# Patient Record
Sex: Male | Born: 1975 | Race: White | Hispanic: No | Marital: Married | Smoking: Former smoker
Health system: Southern US, Community
[De-identification: ages and names within clinical notes are randomized; demographics above are authoritative.]

## PROBLEM LIST (undated history)

## (undated) ENCOUNTER — Ambulatory Visit: Discharge status: Absent without leave | Payer: BC Managed Care – PPO

## (undated) DIAGNOSIS — I10 Essential (primary) hypertension: Secondary | ICD-10-CM

## (undated) DIAGNOSIS — K222 Esophageal obstruction: Secondary | ICD-10-CM

## (undated) DIAGNOSIS — F32A Depression, unspecified: Secondary | ICD-10-CM

## (undated) DIAGNOSIS — R634 Abnormal weight loss: Secondary | ICD-10-CM

## (undated) DIAGNOSIS — F329 Major depressive disorder, single episode, unspecified: Secondary | ICD-10-CM

## (undated) DIAGNOSIS — F419 Anxiety disorder, unspecified: Secondary | ICD-10-CM

## (undated) DIAGNOSIS — E785 Hyperlipidemia, unspecified: Secondary | ICD-10-CM

## (undated) HISTORY — PX: PILONIDAL CYST EXCISION: SHX744

## (undated) HISTORY — DX: Hyperlipidemia, unspecified: E78.5

## (undated) HISTORY — DX: Depression, unspecified: F32.A

## (undated) HISTORY — DX: Essential (primary) hypertension: I10

## (undated) HISTORY — DX: Esophageal obstruction: K22.2

## (undated) HISTORY — DX: Major depressive disorder, single episode, unspecified: F32.9

## (undated) HISTORY — DX: Anxiety disorder, unspecified: F41.9

## (undated) HISTORY — PX: WISDOM TOOTH EXTRACTION: SHX21

## (undated) HISTORY — DX: Abnormal weight loss: R63.4

---

## 2007-01-15 ENCOUNTER — Emergency Department: Payer: Self-pay | Admitting: Unknown Physician Specialty

## 2008-08-26 ENCOUNTER — Ambulatory Visit: Payer: Self-pay | Admitting: Gastroenterology

## 2011-02-02 ENCOUNTER — Encounter: Payer: Self-pay | Admitting: Otolaryngology

## 2011-02-06 ENCOUNTER — Encounter: Payer: Self-pay | Admitting: Otolaryngology

## 2012-03-06 ENCOUNTER — Ambulatory Visit: Payer: Self-pay | Admitting: Gastroenterology

## 2012-04-04 ENCOUNTER — Ambulatory Visit: Payer: Self-pay | Admitting: Gastroenterology

## 2012-12-01 ENCOUNTER — Encounter: Payer: Self-pay | Admitting: Podiatry

## 2012-12-01 ENCOUNTER — Ambulatory Visit (INDEPENDENT_AMBULATORY_CARE_PROVIDER_SITE_OTHER): Payer: Managed Care, Other (non HMO) | Admitting: Podiatry

## 2012-12-01 VITALS — BP 129/81 | HR 110 | Resp 16 | Ht 70.0 in | Wt 210.0 lb

## 2012-12-01 DIAGNOSIS — M79609 Pain in unspecified limb: Secondary | ICD-10-CM

## 2012-12-01 DIAGNOSIS — M79674 Pain in right toe(s): Secondary | ICD-10-CM

## 2012-12-02 NOTE — Progress Notes (Signed)
Bernard [name] presents today as a diabetic with an A1c of a 7.2 concerned about hallux nail right that was growing he regularly and a portion of it sloughed off yesterday. He denies fever chills nausea vomiting muscle aches or pains denies any trauma to the right foot.  Objective: Vital signs are stable he is alert and oriented x3. Strong palpable pulses to the bilateral lower extremity. Neurologic sensorium is intact per Semmes-Weinstein monofilament bilateral. Muscle strength is good bilaterally. No open lesions to the plantar or dorsal cutis. Nail plate does have an irregularity medial to lateral more than likely secondary to trauma many months ago. It does appear that a portion of the nail has sloughed off but is the most distal portion and does not involve any of the soft tissue. There is no signs of infection, i.e. no erythema edema cellulitis drainage or odor.  Assessment: Diabetes with the nail disorder. Nail disorder is primarily limited to the hallux right.  Plan: Followup with me on an as-needed basis.

## 2013-11-12 LAB — HM DIABETES EYE EXAM

## 2014-02-16 LAB — HM DIABETES FOOT EXAM

## 2014-05-24 ENCOUNTER — Ambulatory Visit
Admit: 2014-05-24 | Disposition: A | Discharge status: Home / Self Care | Payer: Self-pay | Attending: Family Medicine | Admitting: Family Medicine

## 2014-05-24 LAB — COMPREHENSIVE METABOLIC PANEL
ALBUMIN: 4.7 g/dL
AST: 23 U/L
Alkaline Phosphatase: 70 U/L
Anion Gap: 9 (ref 7–16)
BILIRUBIN TOTAL: 0.6 mg/dL
BUN: 11 mg/dL
Calcium, Total: 9.6 mg/dL
Chloride: 103 mmol/L
Co2: 29 mmol/L
Creatinine: 0.79 mg/dL
EGFR (African American): >60
Glucose: 267 mg/dL — ABNORMAL HIGH
Potassium: 4.3 mmol/L
SGPT (ALT): 28 U/L
Sodium: 141 mmol/L
Total Protein: 7.2 g/dL

## 2014-05-24 LAB — CBC WITH DIFFERENTIAL/PLATELET
Basophil #: 0.1 10*3/uL (ref 0.0–0.1)
Basophil %: 0.7 %
EOS PCT: 7.8 %
Eosinophil #: 0.6 10*3/uL (ref 0.0–0.7)
HCT: 46.7 % (ref 40.0–52.0)
HGB: 16.1 g/dL (ref 13.0–18.0)
LYMPHS PCT: 23.7 %
Lymphocyte #: 1.8 10*3/uL (ref 1.0–3.6)
MCH: 30.2 pg (ref 26.0–34.0)
MCHC: 34.5 g/dL (ref 32.0–36.0)
MCV: 88 fL (ref 80–100)
Monocyte #: 0.5 x10 3/mm (ref 0.2–1.0)
Monocyte %: 6.5 %
Neutrophil #: 4.6 10*3/uL (ref 1.4–6.5)
Neutrophil %: 61.3 %
PLATELETS: 199 10*3/uL (ref 150–440)
RBC: 5.34 10*6/uL (ref 4.40–5.90)
RDW: 13.5 % (ref 11.5–14.5)
WBC: 7.5 10*3/uL (ref 3.8–10.6)

## 2014-05-24 LAB — CK-MB: CK-MB: 0.7 ng/mL

## 2014-05-24 LAB — CK: CK, TOTAL: 72 U/L

## 2014-05-24 LAB — TROPONIN I

## 2014-07-19 ENCOUNTER — Telehealth: Payer: Self-pay | Admitting: Family Medicine

## 2014-07-19 NOTE — Telephone Encounter (Signed)
Pt called wants to know if all his RX's can be written for 3 month supplies and be sent to [facility]. Please call pt with any questions or concerns at @ [phone]. Thanks.-

## 2014-07-21 ENCOUNTER — Telehealth: Payer: Self-pay | Admitting: Family Medicine

## 2014-07-21 MED ORDER — ATORVASTATIN CALCIUM 40 MG PO TABS: 40.0000 mg | ORAL_TABLET | Freq: Every day | ORAL | Status: DC

## 2014-07-21 MED ORDER — INSULIN GLARGINE 100 UNIT/ML ~~LOC~~ SOLN: 26.0000 [IU] | Freq: Every day | SUBCUTANEOUS | Status: DC

## 2014-07-21 MED ORDER — ARIPIPRAZOLE 5 MG PO TABS: 5.0000 mg | ORAL_TABLET | Freq: Every day | ORAL | Status: DC

## 2014-07-21 MED ORDER — INSULIN ASPART 100 UNIT/ML CARTRIDGE (PENFILL): SUBCUTANEOUS | Status: DC

## 2014-07-21 MED ORDER — AMLODIPINE BESYLATE 10 MG PO TABS: 10.0000 mg | ORAL_TABLET | Freq: Every day | ORAL | Status: DC

## 2014-07-21 MED ORDER — COLESEVELAM HCL 3.75 G PO PACK: 1.0000 | PACK | ORAL | Status: DC

## 2014-07-21 MED ORDER — RABEPRAZOLE SODIUM 20 MG PO TBEC: 20.0000 mg | DELAYED_RELEASE_TABLET | Freq: Two times a day (BID) | ORAL | Status: DC

## 2014-07-21 NOTE — Telephone Encounter (Signed)
Pt called stated some of the medication refills he received are not what he was expecting them to be. Please call pt ASAP @ [phone]. Thanks.

## 2014-07-22 ENCOUNTER — Telehealth: Payer: Self-pay | Admitting: Family Medicine

## 2014-07-22 MED ORDER — RANITIDINE HCL 150 MG PO TABS: 150.0000 mg | ORAL_TABLET | Freq: Every day | ORAL | Status: DC

## 2014-07-22 MED ORDER — INSULIN GLARGINE 100 UNIT/ML ~~LOC~~ SOLN: 30.0000 [IU] | Freq: Two times a day (BID) | SUBCUTANEOUS | Status: DC

## 2014-07-22 MED ORDER — INSULIN LISPRO 100 UNIT/ML (KWIKPEN): 60.0000 [IU] | PEN_INJECTOR | Freq: Three times a day (TID) | SUBCUTANEOUS | Status: DC

## 2014-07-22 MED ORDER — PANTOPRAZOLE SODIUM 40 MG PO TBEC: 40.0000 mg | DELAYED_RELEASE_TABLET | Freq: Every day | ORAL | Status: DC

## 2014-07-22 NOTE — Telephone Encounter (Signed)
E-Fax came through for refill: Rx: Welchol Rx in basket next to my desk

## 2014-07-22 NOTE — Telephone Encounter (Signed)
E-fax came through for refill: Rx: Abilify

## 2014-07-22 NOTE — Telephone Encounter (Signed)
Filled on 07/21/14

## 2014-07-22 NOTE — Telephone Encounter (Signed)
Patient NOT taking Novolog - replaced with Humalog quickpen 60units daily Lantus needs to state 30units BID NOT taking Aciphex (needs to be d/c) Replaced with Protonix 31m Qam and Zantac 1563mQhs ( written by specialist but would like you to fill)  [facility]

## 2014-07-23 ENCOUNTER — Telehealth: Payer: Self-pay

## 2014-07-23 NOTE — Telephone Encounter (Signed)
Fax requesting clarification of directions for Ability 47m Patient takes 1Tab QHS [facility]

## 2014-07-26 ENCOUNTER — Telehealth: Payer: Self-pay | Admitting: Family Medicine

## 2014-08-11 ENCOUNTER — Telehealth: Payer: Self-pay

## 2014-08-11 NOTE — Telephone Encounter (Signed)
Rx Refill Request Received from: [facility] Medication: BD ULT-FN 29G 1/2" Last Seen:03/02/14 Next Due: Now Last Prescription: 2015

## 2014-08-12 MED ORDER — INSULIN PEN NEEDLE 29G X 12.7MM MISC: 1.0000 | Freq: Three times a day (TID) | Status: AC

## 2014-08-12 NOTE — Telephone Encounter (Signed)
Rx sent to his pharmacy.

## 2014-09-09 DIAGNOSIS — E785 Hyperlipidemia, unspecified: Secondary | ICD-10-CM | POA: Insufficient documentation

## 2014-09-09 DIAGNOSIS — I10 Essential (primary) hypertension: Secondary | ICD-10-CM | POA: Insufficient documentation

## 2014-09-09 DIAGNOSIS — F339 Major depressive disorder, recurrent, unspecified: Secondary | ICD-10-CM | POA: Insufficient documentation

## 2014-09-20 ENCOUNTER — Encounter: Payer: Self-pay | Admitting: Family Medicine

## 2014-11-18 ENCOUNTER — Encounter: Payer: Self-pay | Admitting: Family Medicine

## 2014-12-01 ENCOUNTER — Encounter: Payer: Self-pay | Admitting: Family Medicine

## 2014-12-01 ENCOUNTER — Ambulatory Visit (INDEPENDENT_AMBULATORY_CARE_PROVIDER_SITE_OTHER): Payer: Managed Care, Other (non HMO) | Admitting: Family Medicine

## 2014-12-01 VITALS — BP 113/75 | HR 101 | Temp 98.4°F | Ht 69.0 in | Wt 197.0 lb

## 2014-12-01 DIAGNOSIS — I10 Essential (primary) hypertension: Secondary | ICD-10-CM | POA: Diagnosis not present

## 2014-12-01 DIAGNOSIS — Z23 Encounter for immunization: Secondary | ICD-10-CM

## 2014-12-01 DIAGNOSIS — Z0000 Encounter for general adult medical examination without abnormal findings: Secondary | ICD-10-CM | POA: Diagnosis not present

## 2014-12-01 DIAGNOSIS — E785 Hyperlipidemia, unspecified: Secondary | ICD-10-CM

## 2014-12-01 DIAGNOSIS — E109 Type 1 diabetes mellitus without complications: Secondary | ICD-10-CM | POA: Insufficient documentation

## 2014-12-01 DIAGNOSIS — E119 Type 2 diabetes mellitus without complications: Secondary | ICD-10-CM | POA: Diagnosis not present

## 2014-12-01 DIAGNOSIS — F329 Major depressive disorder, single episode, unspecified: Secondary | ICD-10-CM

## 2014-12-01 DIAGNOSIS — F32A Depression, unspecified: Secondary | ICD-10-CM

## 2014-12-01 DIAGNOSIS — E1139 Type 2 diabetes mellitus with other diabetic ophthalmic complication: Secondary | ICD-10-CM | POA: Insufficient documentation

## 2014-12-01 LAB — URINALYSIS, ROUTINE W REFLEX MICROSCOPIC
Bilirubin, UA: NEGATIVE
Ketones, UA: NEGATIVE
Leukocytes, UA: NEGATIVE
NITRITE UA: NEGATIVE
Protein, UA: NEGATIVE
SPEC GRAV UA: 1.01 (ref 1.005–1.030)
Urobilinogen, Ur: 0.2 mg/dL (ref 0.2–1.0)
pH, UA: 6.5 (ref 5.0–7.5)

## 2014-12-01 LAB — MICROSCOPIC EXAMINATION
Bacteria, UA: NONE SEEN
EPITHELIAL CELLS (NON RENAL): NONE SEEN /HPF (ref 0–10)
RBC, UA: NONE SEEN /hpf (ref 0–?)
WBC UA: NONE SEEN /HPF (ref 0–?)

## 2014-12-01 LAB — BAYER DCA HB A1C WAIVED: HB A1C: 8.6 % — AB (ref <7.0)

## 2014-12-01 MED ORDER — PANTOPRAZOLE SODIUM 40 MG PO TBEC: 40.0000 mg | DELAYED_RELEASE_TABLET | Freq: Every day | ORAL | Status: DC

## 2014-12-01 NOTE — Assessment & Plan Note (Signed)
The current medical regimen is effective;  continue present plan and medications.  

## 2014-12-01 NOTE — Progress Notes (Signed)
BP 113/75 mmHg  Pulse 101  Temp(Src) 98.4 F (36.9 C)  Ht 5' 9"  (1.753 m)  Wt 197 lb (89.359 kg)  BMI 29.08 kg/m2  SpO2 97%   Subjective:    Patient ID: Bernard [name], male    DOB: July 14, 1975, 39 y.o.   MRN: [id number]  HPI: Bernard [name] is a 39 y.o. male  Chief Complaint  Patient presents with  . Annual Exam   patient's #1 concern today is restarted smoking again and discuss smoking cessation various agents and medications will start with nicotine chewing gum. Also has not had hemoglobin A1c are diabetic evaluation because of job change in insurance change. Ready to get reestablished. Nerves anxiety been stable Blood pressures been stable Cholesterol is been stable aching medications without side effects and faithfully. Reflux doing okay Relevant past medical, surgical, family and social history reviewed and updated as indicated. Interim medical history since our last visit reviewed. Allergies and medications reviewed and updated.  Review of Systems  Constitutional: Negative.   HENT: Negative.   Eyes: Negative.   Respiratory: Negative.   Cardiovascular: Negative.   Gastrointestinal: Negative.   Endocrine: Negative.   Genitourinary: Negative.   Musculoskeletal: Negative.   Skin: Negative.   Allergic/Immunologic: Negative.   Neurological: Negative.   Hematological: Negative.   Psychiatric/Behavioral: Negative.     Per HPI unless specifically indicated above     Objective:    BP 113/75 mmHg  Pulse 101  Temp(Src) 98.4 F (36.9 C)  Ht 5' 9"  (1.753 m)  Wt 197 lb (89.359 kg)  BMI 29.08 kg/m2  SpO2 97%  Wt Readings from Last 3 Encounters:  12/01/14 197 lb (89.359 kg)  03/02/14 204 lb (92.534 kg)  12/01/12 210 lb (95.255 kg)    Physical Exam  Constitutional: He is oriented to person, place, and time. He appears well-developed and well-nourished.  HENT:  Head: Normocephalic and atraumatic.  Right Ear: External ear normal.  Left Ear:  External ear normal.  Eyes: Conjunctivae and EOM are normal. Pupils are equal, round, and reactive to light.  Neck: Normal range of motion. Neck supple.  Cardiovascular: Normal rate, regular rhythm, normal heart sounds and intact distal pulses.   Pulmonary/Chest: Effort normal and breath sounds normal.  Abdominal: Soft. Bowel sounds are normal. There is no splenomegaly or hepatomegaly.  Genitourinary: Rectum normal and penis normal.  Musculoskeletal: Normal range of motion.  Neurological: He is alert and oriented to person, place, and time. He has normal reflexes.  Skin: No rash noted. No erythema.  Psychiatric: He has a normal mood and affect. His behavior is normal. Judgment and thought content normal.    Results for orders placed or performed during the hospital encounter of 05/24/14  CK  Result Value Ref Range   CK, Total 72 U/L  CK-MB  Result Value Ref Range   CK-MB 0.7 ng/mL  Troponin I  Result Value Ref Range   Troponin-I <0.03 ng/mL      Assessment & Plan:   Problem List Items Addressed This Visit      Cardiovascular and Mediastinum   Hypertension    The current medical regimen is effective;  continue present plan and medications.         Endocrine   Diabetes mellitus without complication ([place])    Discussed diabetes insulin dosing and hyperglycemia We will decrease Levemir by a couple of units and observe nighttime low blood sugar spells We'll also consider midday insulin increase to help with elevated  glucose readings during the afternoon. Also discussed endocrinology referral consideration of insulin pumps etc.      Relevant Orders   Bayer DCA Hb A1c Waived     Other   Hyperlipidemia    The current medical regimen is effective;  continue present plan and medications.       Depression    The current medical regimen is effective;  continue present plan and medications.        Other Visit Diagnoses    Immunization due    -  Primary    Relevant  Orders    Flu Vaccine QUAD 36+ mos PF IM (Fluarix & Fluzone Quad PF) (Completed)    PE (physical exam), annual        Relevant Orders    Comprehensive metabolic panel    Lipid panel    CBC with Differential/Platelet    Urinalysis, Routine w reflex microscopic (not at [facility])    TSH        Follow up plan: Return in about 3 months (around 03/03/2015) for Repeat hemoglobin A1c.

## 2014-12-01 NOTE — Assessment & Plan Note (Signed)
Discussed diabetes insulin dosing and hyperglycemia We will decrease Levemir by a couple of units and observe nighttime low blood sugar spells We'll also consider midday insulin increase to help with elevated glucose readings during the afternoon. Also discussed endocrinology referral consideration of insulin pumps etc.

## 2014-12-02 ENCOUNTER — Encounter: Payer: Self-pay | Admitting: Family Medicine

## 2014-12-02 LAB — COMPREHENSIVE METABOLIC PANEL
A/G RATIO: 2.3 (ref 1.1–2.5)
ALT: 41 IU/L (ref 0–44)
AST: 20 IU/L (ref 0–40)
Albumin: 4.6 g/dL (ref 3.5–5.5)
Alkaline Phosphatase: 74 IU/L (ref 39–117)
BUN/Creatinine Ratio: 9 (ref 8–19)
BUN: 8 mg/dL (ref 6–20)
Bilirubin Total: 0.6 mg/dL (ref 0.0–1.2)
CALCIUM: 9.8 mg/dL (ref 8.7–10.2)
CO2: 24 mmol/L (ref 18–29)
Chloride: 101 mmol/L (ref 97–106)
Creatinine, Ser: 0.93 mg/dL (ref 0.76–1.27)
GFR, EST AFRICAN AMERICAN: 120 mL/min/{1.73_m2} (ref >59)
GFR, EST NON AFRICAN AMERICAN: 104 mL/min/{1.73_m2} (ref >59)
GLOBULIN, TOTAL: 2 g/dL (ref 1.5–4.5)
Glucose: 311 mg/dL — ABNORMAL HIGH (ref 65–99)
POTASSIUM: 4.3 mmol/L (ref 3.5–5.2)
Sodium: 142 mmol/L (ref 136–144)
Total Protein: 6.6 g/dL (ref 6.0–8.5)

## 2014-12-02 LAB — CBC WITH DIFFERENTIAL/PLATELET
BASOS: 1 %
Basophils Absolute: 0 10*3/uL (ref 0.0–0.2)
EOS (ABSOLUTE): 0.4 10*3/uL (ref 0.0–0.4)
Eos: 5 %
HEMOGLOBIN: 15.8 g/dL (ref 12.6–17.7)
Hematocrit: 45.3 % (ref 37.5–51.0)
IMMATURE GRANULOCYTES: 0 %
Immature Grans (Abs): 0 10*3/uL (ref 0.0–0.1)
Lymphocytes Absolute: 1.5 10*3/uL (ref 0.7–3.1)
Lymphs: 21 %
MCH: 30.4 pg (ref 26.6–33.0)
MCHC: 34.9 g/dL (ref 31.5–35.7)
MCV: 87 fL (ref 79–97)
MONOCYTES: 6 %
MONOS ABS: 0.4 10*3/uL (ref 0.1–0.9)
Neutrophils Absolute: 4.7 10*3/uL (ref 1.4–7.0)
Neutrophils: 67 %
Platelets: 221 10*3/uL (ref 150–379)
RBC: 5.19 x10E6/uL (ref 4.14–5.80)
RDW: 13.8 % (ref 12.3–15.4)
WBC: 7 10*3/uL (ref 3.4–10.8)

## 2014-12-02 LAB — TSH: TSH: 1.46 u[IU]/mL (ref 0.450–4.500)

## 2014-12-02 LAB — LIPID PANEL
CHOL/HDL RATIO: 3 ratio (ref 0.0–5.0)
Cholesterol, Total: 154 mg/dL (ref 100–199)
HDL: 52 mg/dL (ref >39)
LDL Calculated: 77 mg/dL (ref 0–99)
TRIGLYCERIDES: 126 mg/dL (ref 0–149)
VLDL Cholesterol Cal: 25 mg/dL (ref 5–40)

## 2015-01-27 ENCOUNTER — Other Ambulatory Visit: Payer: Self-pay | Admitting: Family Medicine

## 2015-02-13 ENCOUNTER — Encounter: Payer: Self-pay | Admitting: *Deleted

## 2015-02-13 ENCOUNTER — Ambulatory Visit
Admission: EM | Admit: 2015-02-13 | Discharge: 2015-02-13 | Disposition: A | Discharge status: Home / Self Care | Payer: Managed Care, Other (non HMO) | Attending: Family Medicine | Admitting: Family Medicine

## 2015-02-13 DIAGNOSIS — H66011 Acute suppurative otitis media with spontaneous rupture of ear drum, right ear: Secondary | ICD-10-CM | POA: Diagnosis not present

## 2015-02-13 MED ORDER — FLUTICASONE PROPIONATE 50 MCG/ACT NA SUSP: 2.0000 | Freq: Every day | NASAL | Status: DC

## 2015-02-13 MED ORDER — FEXOFENADINE-PSEUDOEPHED ER 180-240 MG PO TB24: 1.0000 | ORAL_TABLET | Freq: Every day | ORAL | Status: DC

## 2015-02-13 MED ORDER — AMOXICILLIN-POT CLAVULANATE 875-125 MG PO TABS: 1.0000 | ORAL_TABLET | Freq: Two times a day (BID) | ORAL | Status: DC

## 2015-02-13 NOTE — ED Notes (Addendum)
Pt states that he had a cold about a week ago, now having right ear pain with drainage that started, about 3or4 days ago.

## 2015-02-13 NOTE — ED Provider Notes (Signed)
CSN: [id number]     Arrival date & time 02/13/15  1026 History   First MD Initiated Contact with Patient 02/13/15 1059    Nurses notes were reviewed. Chief Complaint  Patient presents with  . Otalgia   patient reports right ear pain. He states that he's been having URI symptoms for about a week and a half and then on Wednesday he started having right eardrum pain. He states that late Friday night and Saturday cyanosis drainage coming from the right ear as well. States the pain in the ear has fluctuated between 4 and 8/10.  He does not smoke. He is a former smoker He does have diabetes hypertension hyperlipidemia.   Mother and father both had cancer  (Consider location/radiation/quality/duration/timing/severity/associated sxs/prior Treatment) Patient is a 40 y.o. male presenting with ear pain. The history is provided by the patient. No language interpreter was used.  Otalgia Location:  Right Quality:  Pressure and sharp Severity:  Moderate Onset quality:  Sudden Timing:  Constant Progression:  Waxing and waning Chronicity:  New Context: not direct blow and not elevation change   Relieved by:  Nothing Worsened by:  Nothing tried Ineffective treatments:  OTC medications Associated symptoms: congestion and ear discharge     Past Medical History  Diagnosis Date  . Diabetes mellitus without complication ([place])   . Depression   . Hyperlipidemia   . Hypertension   . Esophageal stricture    Past Surgical History  Procedure Laterality Date  . Pilonidal cyst excision     Family History  Problem Relation Age of Onset  . Cancer Mother   . Cancer Father    Social History  Substance Use Topics  . Smoking status: Former Research scientist (life sciences)  . Smokeless tobacco: Never Used     Comment: quit 2014  . Alcohol Use: Yes    Review of Systems  HENT: Positive for congestion, ear discharge and ear pain.   All other systems reviewed and are negative.   Allergies  Review of patient's allergies  indicates no known allergies.  Home Medications   Prior to Admission medications   Medication Sig Start Date End Date Taking? Authorizing Provider  amLODipine (NORVASC) 10 MG tablet Take 1 tablet (10 mg total) by mouth daily. 07/21/14  Yes Guadalupe Maple, MD  ARIPiprazole (ABILIFY) 5 MG tablet Take 1 tablet (5 mg total) by mouth daily. Half a tab at bedtime 07/21/14  Yes Guadalupe Maple, MD  atorvastatin (LIPITOR) 40 MG tablet Take 1 tablet (40 mg total) by mouth daily. 07/21/14  Yes Guadalupe Maple, MD  Colesevelam HCl [facility]) 3.75 G PACK Take 1 packet by mouth 1 day or 1 dose. 07/21/14  Yes Guadalupe Maple, MD  HUMALOG KWIKPEN 100 UNIT/ML KiwkPen INJECT [place] DAILY 01/27/15  Yes Guadalupe Maple, MD  Insulin Pen Needle 29G X 12.7MM MISC 1 each by Does not apply route 3 (three) times daily. 08/12/14  Yes Megan P Johnson, DO  LANTUS SOLOSTAR 100 UNIT/ML Solostar Pen INJECT 29 UNITS SUBCUTANEOUSLY AT BREAKFAST AND INJECT 29 UNITS SUBCUTANEOUSLY AT BEDTIME 01/27/15  Yes Guadalupe Maple, MD  pantoprazole (PROTONIX) 40 MG tablet Take 1 tablet (40 mg total) by mouth daily. 12/01/14  Yes Guadalupe Maple, MD  ranitidine (ZANTAC) 150 MG tablet Take 1 tablet (150 mg total) by mouth at bedtime. 07/22/14  Yes Guadalupe Maple, MD  amoxicillin-clavulanate (AUGMENTIN) 875-125 MG tablet Take 1 tablet by mouth 2 (two) times daily. 02/13/15  Frederich Cha, MD  fexofenadine-pseudoephedrine (ALLEGRA-D ALLERGY & CONGESTION) 180-240 MG 24 hr tablet Take 1 tablet by mouth daily. 02/13/15   Frederich Cha, MD  fluticasone (FLONASE) 50 MCG/ACT nasal spray Place 2 sprays into both nostrils daily. 02/13/15   Frederich Cha, MD   Meds Ordered and Administered this Visit  Medications - No data to display  BP 122/70 mmHg  Pulse 99  Temp(Src) 98.8 F (37.1 C) (Tympanic)  Ht 5' 10"  (1.778 m)  Wt 196 lb (88.905 kg)  BMI 28.12 kg/m2  SpO2 99% No data found.   Physical Exam  ED Course  Procedures  (including critical care time)  Labs Review Labs Reviewed - No data to display  Imaging Review No results found.   Visual Acuity Review  Right Eye Distance:   Left Eye Distance:   Bilateral Distance:    Right Eye Near:   Left Eye Near:    Bilateral Near:         MDM   1. Acute suppurative otitis media of right ear with spontaneous rupture of tympanic membrane, recurrence not specified    Patient will be placed on Augmentin 875 one tablet twice a day Flonase nasal spray 2 puffs each nostril and Allegra-D 24 hours 1 tablet daily. Stressed importance of having the eardrum on the right checked in the about 3 weeks by his PCP to make sure that the spontaneous rupture that I suspected has healed. Explained to him if it hasn't he probably will need ENT referral at that time. He declines work note for tomorrow.    Frederich Cha, MD 02/13/15 1118

## 2015-02-13 NOTE — Discharge Instructions (Signed)
Otitis Media, Adult Otitis media is redness, soreness, and puffiness (swelling) in the space just behind your eardrum (middle ear). It may be caused by allergies or infection. It often happens along with a cold. HOME CARE  Take your medicine as told. Finish it even if you start to feel better.  Only take over-the-counter or prescription medicines for pain, discomfort, or fever as told by your doctor.  Follow up with your doctor as told. GET HELP IF:  You have otitis media only in one ear, or bleeding from your nose, or both.  You notice a lump on your neck.  You are not getting better in 3-5 days.  You feel worse instead of better. GET HELP RIGHT AWAY IF:   You have pain that is not helped with medicine.  You have puffiness, redness, or pain around your ear.  You get a stiff neck.  You cannot move part of your face (paralysis).  You notice that the bone behind your ear hurts when you touch it. MAKE SURE YOU:   Understand these instructions.  Will watch your condition.  Will get help right away if you are not doing well or get worse.   This information is not intended to replace advice given to you by your health care provider. Make sure you discuss any questions you have with your health care provider.   Document Released: 07/11/2007 Document Revised: 02/12/2014 Document Reviewed: 08/19/2012 Elsevier Interactive Patient Education [employer].

## 2015-02-23 ENCOUNTER — Ambulatory Visit (INDEPENDENT_AMBULATORY_CARE_PROVIDER_SITE_OTHER): Payer: Managed Care, Other (non HMO) | Admitting: Unknown Physician Specialty

## 2015-02-23 ENCOUNTER — Encounter: Payer: Self-pay | Admitting: Unknown Physician Specialty

## 2015-02-23 VITALS — BP 116/74 | HR 106 | Temp 98.6°F | Ht 68.7 in | Wt 196.2 lb

## 2015-02-23 DIAGNOSIS — H66011 Acute suppurative otitis media with spontaneous rupture of ear drum, right ear: Secondary | ICD-10-CM | POA: Diagnosis not present

## 2015-02-23 MED ORDER — AMOXICILLIN-POT CLAVULANATE 875-125 MG PO TABS: 1.0000 | ORAL_TABLET | Freq: Two times a day (BID) | ORAL | Status: DC

## 2015-02-23 NOTE — Progress Notes (Signed)
   BP 116/74 mmHg  Pulse 106  Temp(Src) 98.6 F (37 C)  Ht 5' 8.7" (1.745 m)  Wt 196 lb 3.2 oz (88.996 kg)  BMI 29.23 kg/m2  SpO2 98%   Subjective:    Patient ID: Bernard [name], male    DOB: 1975-05-05, 40 y.o.   MRN: [id number]  HPI: Bernard [name] is a 40 y.o. male  Chief Complaint  Patient presents with  . Ear Pain    pt states he had ear pain that started 3 weeks ago, went to urgent care and said ruptured ear drum. Pt states pain is somewhat better and it is in his right ear   Pt was given Augmentin, Flonase, and a decongestant Pt was told to see someone in 3-5 days if not better.  He is improved but wants to make sure.  Very sore when he made the appointment.  Nasal congestion is completely gone.  One more antibiotic pill left for tonight  Relevant past medical, surgical, family and social history reviewed and updated as indicated. Interim medical history since our last visit reviewed. Allergies and medications reviewed and updated.  Review of Systems  Per HPI unless specifically indicated above     Objective:    BP 116/74 mmHg  Pulse 106  Temp(Src) 98.6 F (37 C)  Ht 5' 8.7" (1.745 m)  Wt 196 lb 3.2 oz (88.996 kg)  BMI 29.23 kg/m2  SpO2 98%  Wt Readings from Last 3 Encounters:  02/23/15 196 lb 3.2 oz (88.996 kg)  02/13/15 196 lb (88.905 kg)  12/01/14 197 lb (89.359 kg)    Physical Exam  Constitutional: He is oriented to person, place, and time. He appears well-developed and well-nourished. No distress.  HENT:  Head: Normocephalic and atraumatic.  Right Ear: There is drainage. Tympanic membrane is injected.  Left Ear: Tympanic membrane, external ear and ear canal normal.  Eyes: Conjunctivae and lids are normal. Right eye exhibits no discharge. Left eye exhibits no discharge. No scleral icterus.  Cardiovascular: Normal rate.   Pulmonary/Chest: Effort normal.  Abdominal: Normal appearance. There is no splenomegaly or hepatomegaly.   Musculoskeletal: Normal range of motion.  Neurological: He is alert and oriented to person, place, and time.  Skin: Skin is intact. No rash noted. No pallor.  Psychiatric: He has a normal mood and affect. His behavior is normal. Judgment and thought content normal.   Discussed pt with Dr. Sanda Klein who came in to see patient.  Results for orders placed or performed in visit on 02/23/15  HM DIABETES EYE EXAM  Result Value Ref Range   HM Diabetic Eye Exam No Retinopathy No Retinopathy  HM DIABETES FOOT EXAM  Result Value Ref Range   HM Diabetic Foot Exam from PP       Assessment & Plan:   Problem List Items Addressed This Visit    None    Visit Diagnoses    Acute suppurative otitis media of right ear with spontaneous rupture of tympanic membrane, recurrence not specified    -  Primary    Referred to ENT.  Will continue Augmentin for another 10 days    Relevant Medications    amoxicillin-clavulanate (AUGMENTIN) 875-125 MG tablet    Other Relevant Orders    Ambulatory referral to ENT        Follow up plan: Return for Refer to ENT.

## 2015-03-07 ENCOUNTER — Ambulatory Visit (INDEPENDENT_AMBULATORY_CARE_PROVIDER_SITE_OTHER): Payer: Managed Care, Other (non HMO) | Admitting: Family Medicine

## 2015-03-07 ENCOUNTER — Encounter: Payer: Self-pay | Admitting: Family Medicine

## 2015-03-07 VITALS — BP 103/70 | HR 108 | Temp 98.7°F | Ht 69.3 in | Wt 195.0 lb

## 2015-03-07 DIAGNOSIS — E109 Type 1 diabetes mellitus without complications: Secondary | ICD-10-CM | POA: Diagnosis not present

## 2015-03-07 DIAGNOSIS — E119 Type 2 diabetes mellitus without complications: Secondary | ICD-10-CM | POA: Diagnosis not present

## 2015-03-07 DIAGNOSIS — I10 Essential (primary) hypertension: Secondary | ICD-10-CM | POA: Diagnosis not present

## 2015-03-07 LAB — MICROALBUMIN, URINE WAIVED
Creatinine, Urine Waived: 200 mg/dL (ref 10–300)
MICROALB, UR WAIVED: 30 mg/L — AB (ref 0–19)
Microalb/Creat Ratio: <30 mg/g (ref <30)

## 2015-03-07 LAB — BAYER DCA HB A1C WAIVED: HB A1C: 8.8 % — AB (ref <7.0)

## 2015-03-07 NOTE — Assessment & Plan Note (Signed)
Patient with poor control over the last year in spite of trying hard and insulin adjustments discussed importance of control again which patient is well aware of. We'll make referral to endocrinology to further evaluate maybe consider insulin pump

## 2015-03-07 NOTE — Progress Notes (Signed)
   BP 103/70 mmHg  Pulse 108  Temp(Src) 98.7 F (37.1 C)  Ht 5' 9.3" (1.76 m)  Wt 195 lb (88.451 kg)  BMI 28.55 kg/m2  SpO2 95%   Subjective:    Patient ID: Bernard [name], male    DOB: 1975-08-01, 40 y.o.   MRN: [id number]  HPI: Bernard [name] is a 40 y.o. male  Chief Complaint  Patient presents with  . Diabetes  . eardrum rupture    right    patient's eardrum in hearing on the right is back to normal took his round of antibiotics no further drainage no further symptoms Diabetes patient is been very good and a full and taking and adjusting insulin glucoses have been elevated Patient's really tried hard over this last year No issues with insulin.  Relevant past medical, surgical, family and social history reviewed and updated as indicated. Interim medical history since our last visit reviewed. Allergies and medications reviewed and updated.  Review of Systems  Constitutional: Negative.   Respiratory: Negative.   Cardiovascular: Negative.     Per HPI unless specifically indicated above     Objective:    BP 103/70 mmHg  Pulse 108  Temp(Src) 98.7 F (37.1 C)  Ht 5' 9.3" (1.76 m)  Wt 195 lb (88.451 kg)  BMI 28.55 kg/m2  SpO2 95%  Wt Readings from Last 3 Encounters:  03/07/15 195 lb (88.451 kg)  02/23/15 196 lb 3.2 oz (88.996 kg)  02/13/15 196 lb (88.905 kg)    Physical Exam  Constitutional: He is oriented to person, place, and time. He appears well-developed and well-nourished. No distress.  HENT:  Head: Normocephalic and atraumatic.  Right Ear: Hearing normal.  Left Ear: Hearing normal.  Nose: Nose normal.  Eyes: Conjunctivae and lids are normal. Right eye exhibits no discharge. Left eye exhibits no discharge. No scleral icterus.  Cardiovascular: Normal rate, regular rhythm and normal heart sounds.   Pulmonary/Chest: Effort normal and breath sounds normal. No respiratory distress.  Musculoskeletal: Normal range of motion.  Neurological: He  is alert and oriented to person, place, and time.  Skin: Skin is intact. No rash noted.  Psychiatric: He has a normal mood and affect. His speech is normal and behavior is normal. Judgment and thought content normal. Cognition and memory are normal.    Results for orders placed or performed in visit on 02/23/15  HM DIABETES EYE EXAM  Result Value Ref Range   HM Diabetic Eye Exam No Retinopathy No Retinopathy  HM DIABETES FOOT EXAM  Result Value Ref Range   HM Diabetic Foot Exam from PP       Assessment & Plan:   Problem List Items Addressed This Visit      Cardiovascular and Mediastinum   Hypertension    The current medical regimen is effective;  continue present plan and medications.         Endocrine   DM type 1 (diabetes mellitus, type 1) ([place]) - Primary    Patient with poor control over the last year in spite of trying hard and insulin adjustments discussed importance of control again which patient is well aware of. We'll make referral to endocrinology to further evaluate maybe consider insulin pump       Relevant Orders   Ambulatory referral to Endocrinology       Follow up plan: Return in about 3 months (around 06/05/2015) for if not with endo yet otherwise late spring.

## 2015-03-07 NOTE — Assessment & Plan Note (Signed)
The current medical regimen is effective;  continue present plan and medications.  

## 2015-03-08 ENCOUNTER — Telehealth: Payer: Self-pay | Admitting: *Deleted

## 2015-03-08 NOTE — Telephone Encounter (Signed)
Patient has appt 03/18/15 @ 1:30 with Dr. Pryor Ochoa. Patient is to arrive 30 min prior for registration. Patient notified to bring list of meds, ins. Card and any co-pay required.

## 2015-04-08 LAB — HM DIABETES EYE EXAM

## 2015-04-15 NOTE — Telephone Encounter (Signed)
done

## 2015-05-05 ENCOUNTER — Telehealth: Payer: Self-pay | Admitting: Family Medicine

## 2015-05-05 NOTE — Telephone Encounter (Signed)
Patient needs refill on his One Touch test strips for 4x a day send to pharmacy [place] home delivery. Thanks.

## 2015-06-06 ENCOUNTER — Ambulatory Visit: Payer: Managed Care, Other (non HMO) | Admitting: Family Medicine

## 2015-07-11 ENCOUNTER — Encounter: Payer: Self-pay | Admitting: Family Medicine

## 2015-07-11 ENCOUNTER — Telehealth: Payer: Self-pay | Admitting: Family Medicine

## 2015-07-11 ENCOUNTER — Ambulatory Visit (INDEPENDENT_AMBULATORY_CARE_PROVIDER_SITE_OTHER): Payer: BLUE CROSS/BLUE SHIELD | Admitting: Family Medicine

## 2015-07-11 VITALS — BP 98/65 | HR 103 | Temp 98.6°F | Ht 69.3 in | Wt 186.0 lb

## 2015-07-11 DIAGNOSIS — E785 Hyperlipidemia, unspecified: Secondary | ICD-10-CM | POA: Diagnosis not present

## 2015-07-11 DIAGNOSIS — F32A Depression, unspecified: Secondary | ICD-10-CM

## 2015-07-11 DIAGNOSIS — I10 Essential (primary) hypertension: Secondary | ICD-10-CM

## 2015-07-11 DIAGNOSIS — E109 Type 1 diabetes mellitus without complications: Secondary | ICD-10-CM | POA: Diagnosis not present

## 2015-07-11 DIAGNOSIS — F329 Major depressive disorder, single episode, unspecified: Secondary | ICD-10-CM | POA: Diagnosis not present

## 2015-07-11 LAB — LP+ALT+AST PICCOLO, WAIVED
ALT (SGPT) Piccolo, Waived: 33 U/L (ref 10–47)
AST (SGOT) Piccolo, Waived: 36 U/L (ref 11–38)
Chol/HDL Ratio Piccolo,Waive: 2.4 mg/dL
Cholesterol Piccolo, Waived: 119 mg/dL (ref <200)
HDL CHOL PICCOLO, WAIVED: 49 mg/dL — AB (ref >59)
LDL CHOL CALC PICCOLO WAIVED: 43 mg/dL (ref <100)
TRIGLYCERIDES PICCOLO,WAIVED: 134 mg/dL (ref <150)
VLDL CHOL CALC PICCOLO,WAIVE: 27 mg/dL (ref <30)

## 2015-07-11 LAB — BAYER DCA HB A1C WAIVED: HB A1C: 8.8 % — AB (ref <7.0)

## 2015-07-11 MED ORDER — ARIPIPRAZOLE 5 MG PO TABS: 5.0000 mg | ORAL_TABLET | Freq: Every day | ORAL | Status: DC

## 2015-07-11 MED ORDER — ATORVASTATIN CALCIUM 40 MG PO TABS: 40.0000 mg | ORAL_TABLET | Freq: Every day | ORAL | Status: DC

## 2015-07-11 MED ORDER — RANITIDINE HCL 150 MG PO TABS: 150.0000 mg | ORAL_TABLET | Freq: Every day | ORAL | Status: DC

## 2015-07-11 MED ORDER — PANTOPRAZOLE SODIUM 40 MG PO TBEC: 40.0000 mg | DELAYED_RELEASE_TABLET | Freq: Every day | ORAL | Status: DC

## 2015-07-11 MED ORDER — AMLODIPINE BESYLATE 5 MG PO TABS: 5.0000 mg | ORAL_TABLET | Freq: Every day | ORAL | Status: DC

## 2015-07-11 MED ORDER — ARIPIPRAZOLE 5 MG PO TABS: 2.5000 mg | ORAL_TABLET | Freq: Every day | ORAL | Status: DC

## 2015-07-11 NOTE — Assessment & Plan Note (Signed)
Blood pressure on the low side may be contributing to fatigue symptoms will decrease amlodipine to 5 mg observe response patient will check blood pressure from time to time

## 2015-07-11 NOTE — Progress Notes (Signed)
BP 98/65 mmHg  Pulse 103  Temp(Src) 98.6 F (37 C)  Ht 5' 9.3" (1.76 m)  Wt 186 lb (84.369 kg)  BMI 27.24 kg/m2  SpO2 98%   Subjective:    Patient ID: Bernard [name], male    DOB: [birth year], 40 y.o.   MRN: [id number]  HPI: Bernard [name] is a 40 y.o. male  Chief Complaint  Patient presents with  . Diabetes  . Hyperlipidemia  . Hypertension  Due to job change as patient hasn't been to endocrinology yet but has appointment coming up in a few weeks. Patient's main concern today is just marked fatigue could sleep 12 hours a day and still wakes up not refreshed. Patient's had a sleep apnea test and wife does not report snoring or sleep apnea type symptoms. Patient with no PND orthopnea or other restlessness during the night sleeps well. Patient has lost 10 pounds this year and blood pressure is low but no lightheaded dizziness No change in bowel or bladder habits no blood in stool or urine no other symptoms blood sugar seems to be okay but not great Depression bipolar not cycling moods have been stable   Relevant past medical, surgical, family and social history reviewed and updated as indicated. Interim medical history since our last visit reviewed. Allergies and medications reviewed and updated.  Review of Systems  Constitutional: Positive for fatigue. Negative for fever and chills.  Respiratory: Negative.   Cardiovascular: Negative.     Per HPI unless specifically indicated above     Objective:    BP 98/65 mmHg  Pulse 103  Temp(Src) 98.6 F (37 C)  Ht 5' 9.3" (1.76 m)  Wt 186 lb (84.369 kg)  BMI 27.24 kg/m2  SpO2 98%  Wt Readings from Last 3 Encounters:  07/11/15 186 lb (84.369 kg)  03/07/15 195 lb (88.451 kg)  02/23/15 196 lb 3.2 oz (88.996 kg)    Physical Exam  Constitutional: He is oriented to person, place, and time. He appears well-developed and well-nourished. No distress.  HENT:  Head: Normocephalic and atraumatic.  Right Ear:  Hearing normal.  Left Ear: Hearing normal.  Nose: Nose normal.  Eyes: Conjunctivae and lids are normal. Right eye exhibits no discharge. Left eye exhibits no discharge. No scleral icterus.  Cardiovascular: Normal rate, regular rhythm and normal heart sounds.   Pulmonary/Chest: Effort normal and breath sounds normal. No respiratory distress.  Musculoskeletal: Normal range of motion.  Neurological: He is alert and oriented to person, place, and time.  Foot exam normal  Skin: Skin is intact. No rash noted.  Psychiatric: He has a normal mood and affect. His speech is normal and behavior is normal. Judgment and thought content normal. Cognition and memory are normal.    Results for orders placed or performed in visit on 07/11/15  HM DIABETES EYE EXAM  Result Value Ref Range   HM Diabetic Eye Exam Retinopathy (A) No Retinopathy      Assessment & Plan:   Problem List Items Addressed This Visit      Cardiovascular and Mediastinum   Hypertension - Primary    Blood pressure on the low side may be contributing to fatigue symptoms will decrease amlodipine to 5 mg observe response patient will check blood pressure from time to time      Relevant Medications   amLODipine (NORVASC) 5 MG tablet   atorvastatin (LIPITOR) 40 MG tablet   Other Relevant Orders   Basic metabolic panel     Endocrine  DM type 1 (diabetes mellitus, type 1) (HCC)    A1c higher at 8.8 may be explaining patient's fatigue patient has appointment with endocrinology in 1 week or so.      Relevant Medications   atorvastatin (LIPITOR) 40 MG tablet   Other Relevant Orders   Bayer DCA Hb A1c Waived     Other   Depression    The current medical regimen is effective;  continue present plan and medications.       Hyperlipidemia    The current medical regimen is effective;  continue present plan and medications.       Relevant Medications   amLODipine (NORVASC) 5 MG tablet   atorvastatin (LIPITOR) 40 MG tablet    Other Relevant Orders   LP+ALT+AST [place], Waived       Follow up plan: Return in about 6 months (around 01/10/2016) for Physical Exam no a1c.

## 2015-07-11 NOTE — Assessment & Plan Note (Signed)
The current medical regimen is effective;  continue present plan and medications.  

## 2015-07-11 NOTE — Assessment & Plan Note (Signed)
A1c higher at 8.8 may be explaining patient's fatigue patient has appointment with endocrinology in 1 week or so.

## 2015-07-11 NOTE — Telephone Encounter (Signed)
Pharmacy called and would like clarification on the directions for ARIPiprazole (ABILIFY) 5 MG tablet

## 2015-07-12 ENCOUNTER — Encounter: Payer: Self-pay | Admitting: Family Medicine

## 2015-07-12 LAB — BASIC METABOLIC PANEL
BUN/Creatinine Ratio: 8 — ABNORMAL LOW (ref 9–20)
BUN: 8 mg/dL (ref 6–20)
CHLORIDE: 101 mmol/L (ref 96–106)
CO2: 26 mmol/L (ref 18–29)
Calcium: 9.7 mg/dL (ref 8.7–10.2)
Creatinine, Ser: 0.98 mg/dL (ref 0.76–1.27)
GFR calc non Af Amer: 97 mL/min/{1.73_m2} (ref >59)
GFR, EST AFRICAN AMERICAN: 112 mL/min/{1.73_m2} (ref >59)
Glucose: 145 mg/dL — ABNORMAL HIGH (ref 65–99)
POTASSIUM: 4.3 mmol/L (ref 3.5–5.2)
SODIUM: 144 mmol/L (ref 134–144)

## 2015-09-14 ENCOUNTER — Ambulatory Visit (INDEPENDENT_AMBULATORY_CARE_PROVIDER_SITE_OTHER): Payer: BLUE CROSS/BLUE SHIELD | Admitting: Family Medicine

## 2015-09-14 ENCOUNTER — Encounter: Payer: Self-pay | Admitting: Family Medicine

## 2015-09-14 VITALS — BP 110/73 | HR 104 | Temp 99.5°F | Wt 192.0 lb

## 2015-09-14 DIAGNOSIS — H6092 Unspecified otitis externa, left ear: Secondary | ICD-10-CM | POA: Diagnosis not present

## 2015-09-14 MED ORDER — OFLOXACIN 0.3 % OT SOLN: 5.0000 [drp] | Freq: Two times a day (BID) | OTIC | 0 refills | Status: DC

## 2015-09-14 NOTE — Progress Notes (Signed)
   BP 110/73   Pulse (!) 104   Temp 99.5 F (37.5 C)   Wt 192 lb (87.1 kg)   SpO2 98%   BMI 28.11 kg/m    Subjective:    Patient ID: Bernard [name], male    DOB: 1975/05/08, 40 y.o.   MRN: [id number]  HPI: Yolanda Dockendorf is a 40 y.o. male  Chief Complaint  Patient presents with  . Ear Pain    x 4 days, left ear off and on;  no drainage,  head congestion.sinus issues sore throat, or cough. .    Left ear pain x 4 days. Worst when ear is touched. Has been taking tylenol prn with some relief. Pain is mild and sharp, intermittent. 3/10 at rest and 5-6/10 when ear is touched. Denies fever, congestion, or ear drainage. Right TM perforation earlier this year from a bad cold, but no other hx of ear issues.   Relevant past medical, surgical, family and social history reviewed and updated as indicated. Interim medical history since our last visit reviewed. Allergies and medications reviewed and updated.  Review of Systems  Constitutional: Negative.   HENT: Positive for ear pain. Negative for ear discharge.   Respiratory: Negative.   Cardiovascular: Negative.   Gastrointestinal: Negative.   Genitourinary: Negative.   Musculoskeletal: Negative.   Neurological: Negative.   Psychiatric/Behavioral: Negative.     Per HPI unless specifically indicated above     Objective:    BP 110/73   Pulse (!) 104   Temp 99.5 F (37.5 C)   Wt 192 lb (87.1 kg)   SpO2 98%   BMI 28.11 kg/m   Wt Readings from Last 3 Encounters:  09/14/15 192 lb (87.1 kg)  07/11/15 186 lb (84.4 kg)  03/07/15 195 lb (88.5 kg)    Physical Exam  Constitutional: He appears well-developed and well-nourished.  HENT:  Head: Atraumatic.  Right Ear: External ear normal.  Mouth/Throat: Oropharynx is clear and moist.  Left EAC inflamed and irritated, but no discharge present.  Moderate to severe discomfort when earlobe is tugged  Eyes: Conjunctivae are normal. No scleral icterus.  Neck: Normal range  of motion. Neck supple.  Cardiovascular: Normal rate.   Pulmonary/Chest: Effort normal.  Musculoskeletal: Normal range of motion.  Nursing note and vitals reviewed.       Assessment & Plan:   Problem List Items Addressed This Visit    None    Visit Diagnoses    Otitis externa, left    -  Primary   Ofloxacin drops given. Patient to avoid swimming for a few days until irritation goes down. Will follow up if no improvement in the next 3-5 days.        Follow up plan: Return if symptoms worsen or fail to improve.

## 2015-09-14 NOTE — Patient Instructions (Signed)
Follow up as needed

## 2015-10-17 ENCOUNTER — Other Ambulatory Visit: Payer: Self-pay

## 2015-10-17 MED ORDER — INSULIN GLARGINE 100 UNIT/ML SOLOSTAR PEN: PEN_INJECTOR | SUBCUTANEOUS | 12 refills | Status: DC

## 2016-01-11 ENCOUNTER — Ambulatory Visit: Payer: Self-pay | Admitting: Family Medicine

## 2016-02-13 ENCOUNTER — Ambulatory Visit (INDEPENDENT_AMBULATORY_CARE_PROVIDER_SITE_OTHER): Payer: 59 | Admitting: Family Medicine

## 2016-02-13 ENCOUNTER — Encounter: Payer: Self-pay | Admitting: Family Medicine

## 2016-02-13 VITALS — BP 119/79 | HR 87 | Temp 98.3°F | Ht 69.7 in | Wt 194.6 lb

## 2016-02-13 DIAGNOSIS — Z0000 Encounter for general adult medical examination without abnormal findings: Secondary | ICD-10-CM

## 2016-02-13 DIAGNOSIS — Z23 Encounter for immunization: Secondary | ICD-10-CM | POA: Diagnosis not present

## 2016-02-13 DIAGNOSIS — Z0001 Encounter for general adult medical examination with abnormal findings: Secondary | ICD-10-CM | POA: Diagnosis not present

## 2016-02-13 DIAGNOSIS — E109 Type 1 diabetes mellitus without complications: Secondary | ICD-10-CM | POA: Diagnosis not present

## 2016-02-13 DIAGNOSIS — E7800 Pure hypercholesterolemia, unspecified: Secondary | ICD-10-CM

## 2016-02-13 DIAGNOSIS — F331 Major depressive disorder, recurrent, moderate: Secondary | ICD-10-CM

## 2016-02-13 DIAGNOSIS — I10 Essential (primary) hypertension: Secondary | ICD-10-CM | POA: Diagnosis not present

## 2016-02-13 LAB — UA/M W/RFLX CULTURE, ROUTINE
BILIRUBIN UA: NEGATIVE
GLUCOSE, UA: NEGATIVE
KETONES UA: NEGATIVE
Leukocytes, UA: NEGATIVE
Nitrite, UA: NEGATIVE
PH UA: 6 (ref 5.0–7.5)
PROTEIN UA: NEGATIVE
RBC UA: NEGATIVE
SPEC GRAV UA: 1.025 (ref 1.005–1.030)
Urobilinogen, Ur: 0.2 mg/dL (ref 0.2–1.0)

## 2016-02-13 LAB — MICROSCOPIC EXAMINATION
Bacteria, UA: NONE SEEN
RBC MICROSCOPIC, UA: NONE SEEN /HPF (ref 0–?)

## 2016-02-13 MED ORDER — PANTOPRAZOLE SODIUM 40 MG PO TBEC: 40.0000 mg | DELAYED_RELEASE_TABLET | Freq: Every day | ORAL | 12 refills | Status: DC

## 2016-02-13 MED ORDER — ATORVASTATIN CALCIUM 40 MG PO TABS: 40.0000 mg | ORAL_TABLET | Freq: Every day | ORAL | 12 refills | Status: DC

## 2016-02-13 MED ORDER — AMLODIPINE BESYLATE 5 MG PO TABS: 5.0000 mg | ORAL_TABLET | Freq: Every day | ORAL | 12 refills | Status: DC

## 2016-02-13 MED ORDER — LURASIDONE HCL 20 MG PO TABS: 20.0000 mg | ORAL_TABLET | Freq: Every day | ORAL | 7 refills | Status: DC

## 2016-02-13 NOTE — Assessment & Plan Note (Signed)
The current medical regimen is effective;  continue present plan and medications.  

## 2016-02-13 NOTE — Patient Instructions (Addendum)

## 2016-02-13 NOTE — Assessment & Plan Note (Signed)
Discuss Will stop Abilify begain latuda 85m

## 2016-02-13 NOTE — Assessment & Plan Note (Signed)
Followed by endocrinology and doing better with insulin pump.

## 2016-02-13 NOTE — Progress Notes (Signed)
BP 119/79 (BP Location: Left Arm, Patient Position: Sitting, Cuff Size: Large)   Pulse 87   Temp 98.3 F (36.8 C)   Ht 5' 9.7" (1.77 m)   Wt 194 lb 9.6 oz (88.3 kg)   SpO2 97%   BMI 28.16 kg/m    Subjective:    Patient ID: Bernard [name], male    DOB: 13-Nov-1975, 41 y.o.   MRN: [id number]  HPI: Bernard [name] is a 41 y.o. male  Chief Complaint  Patient presents with  . Annual Exam  . Medication Refill    pt states he would like a refill on zofran   Patient follow-up all in all doing well having some nerve issues and especially OCD issues which are really interfering with family life and her becoming a problem. These are checking behaviors cleaning behaviors etc. Patient's tried increased Abilify dosing but had some shaking with that and is on 5 mg every other day. Other medications following diabetes with endocrinology has insulin pump now and is doing much better with a better lifestyle. Other medical issues blood pressure cholesterol reflux all stable with medication stable also. Relevant past medical, surgical, family and social history reviewed and updated as indicated. Interim medical history since our last visit reviewed. Allergies and medications reviewed and updated.  Review of Systems  Constitutional: Negative.   HENT: Negative.   Eyes: Negative.   Respiratory: Negative.   Cardiovascular: Negative.   Gastrointestinal: Negative.   Endocrine: Negative.   Genitourinary: Negative.   Musculoskeletal: Negative.   Skin: Negative.   Allergic/Immunologic: Negative.   Neurological: Negative.   Hematological: Negative.   Psychiatric/Behavioral: Negative.     Per HPI unless specifically indicated above     Objective:    BP 119/79 (BP Location: Left Arm, Patient Position: Sitting, Cuff Size: Large)   Pulse 87   Temp 98.3 F (36.8 C)   Ht 5' 9.7" (1.77 m)   Wt 194 lb 9.6 oz (88.3 kg)   SpO2 97%   BMI 28.16 kg/m   Wt Readings from Last 3  Encounters:  02/13/16 194 lb 9.6 oz (88.3 kg)  09/14/15 192 lb (87.1 kg)  07/11/15 186 lb (84.4 kg)    Physical Exam  Constitutional: He is oriented to person, place, and time. He appears well-developed and well-nourished.  HENT:  Head: Normocephalic.  Right Ear: External ear normal.  Left Ear: External ear normal.  Nose: Nose normal.  Eyes: Conjunctivae and EOM are normal. Pupils are equal, round, and reactive to light.  Neck: Normal range of motion. Neck supple. No thyromegaly present.  Cardiovascular: Normal rate, regular rhythm, normal heart sounds and intact distal pulses.   Pulmonary/Chest: Effort normal and breath sounds normal.  Abdominal: Soft. Bowel sounds are normal. There is no splenomegaly or hepatomegaly.  Genitourinary: Penis normal.  Musculoskeletal: Normal range of motion.  Lymphadenopathy:    He has no cervical adenopathy.  Neurological: He is alert and oriented to person, place, and time. He has normal reflexes.  Skin: Skin is warm and dry.  Psychiatric: He has a normal mood and affect. His behavior is normal. Judgment and thought content normal.    Results for orders placed or performed in visit on 07/12/15  HM DIABETES EYE EXAM  Result Value Ref Range   HM Diabetic Eye Exam Retinopathy (A) No Retinopathy      Assessment & Plan:   Problem List Items Addressed This Visit      Cardiovascular and Mediastinum  Hypertension    The current medical regimen is effective;  continue present plan and medications.       Relevant Medications   atorvastatin (LIPITOR) 40 MG tablet   amLODipine (NORVASC) 5 MG tablet     Endocrine   DM type 1 (diabetes mellitus, type 1) ([place])    Followed by endocrinology and doing better with insulin pump.      Relevant Medications   insulin lispro (HUMALOG) 100 UNIT/ML injection   atorvastatin (LIPITOR) 40 MG tablet     Other   Depression    Discuss Will stop Abilify begain latuda 52m      Hyperlipidemia    The  current medical regimen is effective;  continue present plan and medications.       Relevant Medications   atorvastatin (LIPITOR) 40 MG tablet   amLODipine (NORVASC) 5 MG tablet    Other Visit Diagnoses    Annual physical exam    -  Primary   Relevant Orders   CBC with Differential/Platelet   Comprehensive metabolic panel   UA/M w/rflx Culture, Routine   Lipid panel   TSH   Need for influenza vaccination       Relevant Orders   Flu Vaccine QUAD 36+ mos IM (Completed)       Follow up plan: No Follow-up on file.

## 2016-02-14 ENCOUNTER — Encounter: Payer: Self-pay | Admitting: Family Medicine

## 2016-02-14 LAB — COMPREHENSIVE METABOLIC PANEL
ALT: 46 IU/L — AB (ref 0–44)
AST: 26 IU/L (ref 0–40)
Albumin/Globulin Ratio: 2.6 — ABNORMAL HIGH (ref 1.2–2.2)
Albumin: 4.6 g/dL (ref 3.5–5.5)
Alkaline Phosphatase: 60 IU/L (ref 39–117)
BUN/Creatinine Ratio: 11 (ref 9–20)
BUN: 10 mg/dL (ref 6–20)
Bilirubin Total: 0.5 mg/dL (ref 0.0–1.2)
CALCIUM: 9.3 mg/dL (ref 8.7–10.2)
CO2: 25 mmol/L (ref 18–29)
CREATININE: 0.9 mg/dL (ref 0.76–1.27)
Chloride: 103 mmol/L (ref 96–106)
GFR calc Af Amer: 124 mL/min/{1.73_m2} (ref >59)
GFR, EST NON AFRICAN AMERICAN: 107 mL/min/{1.73_m2} (ref >59)
Globulin, Total: 1.8 g/dL (ref 1.5–4.5)
Glucose: 177 mg/dL — ABNORMAL HIGH (ref 65–99)
POTASSIUM: 4.2 mmol/L (ref 3.5–5.2)
Sodium: 142 mmol/L (ref 134–144)
TOTAL PROTEIN: 6.4 g/dL (ref 6.0–8.5)

## 2016-02-14 LAB — CBC WITH DIFFERENTIAL/PLATELET
BASOS: 0 %
Basophils Absolute: 0 10*3/uL (ref 0.0–0.2)
EOS (ABSOLUTE): 0.4 10*3/uL (ref 0.0–0.4)
EOS: 6 %
HEMOGLOBIN: 14.8 g/dL (ref 13.0–17.7)
Hematocrit: 41.7 % (ref 37.5–51.0)
Immature Grans (Abs): 0 10*3/uL (ref 0.0–0.1)
Immature Granulocytes: 1 %
LYMPHS ABS: 1.5 10*3/uL (ref 0.7–3.1)
Lymphs: 25 %
MCH: 30.5 pg (ref 26.6–33.0)
MCHC: 35.5 g/dL (ref 31.5–35.7)
MCV: 86 fL (ref 79–97)
MONOCYTES: 8 %
Monocytes Absolute: 0.5 10*3/uL (ref 0.1–0.9)
NEUTROS ABS: 3.7 10*3/uL (ref 1.4–7.0)
Neutrophils: 60 %
Platelets: 225 10*3/uL (ref 150–379)
RBC: 4.86 x10E6/uL (ref 4.14–5.80)
RDW: 13 % (ref 12.3–15.4)
WBC: 6.1 10*3/uL (ref 3.4–10.8)

## 2016-02-14 LAB — LIPID PANEL
CHOL/HDL RATIO: 2.7 ratio (ref 0.0–5.0)
Cholesterol, Total: 108 mg/dL (ref 100–199)
HDL: 40 mg/dL (ref >39)
LDL CALC: 58 mg/dL (ref 0–99)
TRIGLYCERIDES: 52 mg/dL (ref 0–149)
VLDL CHOLESTEROL CAL: 10 mg/dL (ref 5–40)

## 2016-02-14 LAB — TSH: TSH: 1.66 u[IU]/mL (ref 0.450–4.500)

## 2016-02-16 ENCOUNTER — Telehealth: Payer: Self-pay | Admitting: Family Medicine

## 2016-02-16 NOTE — Telephone Encounter (Signed)
Patient called regarding his insurance will not cover Latuda so he needs another med in the place of this and the Zofran you prescribed per the pharmacy it was never received.  Pharmacy- [facility]    Thank You Santiago Glad

## 2016-02-17 MED ORDER — ONDANSETRON 4 MG PO TBDP: 4.0000 mg | ORAL_TABLET | Freq: Three times a day (TID) | ORAL | 0 refills | Status: DC | PRN

## 2016-02-17 NOTE — Telephone Encounter (Signed)
Rx for zofran sent to his pharmacy. Will need to wait for Dr. Rance Muir return to change medication.

## 2016-02-17 NOTE — Telephone Encounter (Signed)
Called patient to let him know the Zofran was sent in and Dr. Jeananne Rama would address the other issue when he returns next week.  Santiago Glad

## 2016-02-17 NOTE — Telephone Encounter (Signed)
Routing to provider. Zofran rx was never sent in according to chart.

## 2016-02-20 MED ORDER — BREXPIPRAZOLE 2 MG PO TABS: 2.0000 mg | ORAL_TABLET | Freq: Every day | ORAL | 1 refills | Status: DC

## 2016-02-20 MED ORDER — BREXPIPRAZOLE 1 MG PO TABS: 1.0000 mg | ORAL_TABLET | Freq: Every day | ORAL | 0 refills | Status: DC

## 2016-02-20 NOTE — Telephone Encounter (Signed)
Pt called last week because insurance will not cover Latuda. Pt would like alternative medication sent to pharmacy. Please advise.

## 2016-02-20 NOTE — Telephone Encounter (Signed)
A new medication was sent to the drugstore. He should print out the appropriate saving card from the Internet. If there is still no exception of his saving card and insurance company patient is to do research with his insurance company prior to contacting me again on what drugs they may find acceptable.

## 2016-02-20 NOTE — Telephone Encounter (Signed)
Message relayed to patient. Verbalized understanding and denied questions.   

## 2016-02-20 NOTE — Addendum Note (Signed)
Addended byGolden Pop on: 02/20/2016 12:40 PM   Modules accepted: Orders

## 2016-02-20 NOTE — Telephone Encounter (Signed)
Will try Rexuilti and patient will print off insurance card from Internet if not covered by insurance patient will do research with his insurance company before contacting me again regarding his medication.

## 2016-02-21 MED ORDER — RISPERIDONE 1 MG PO TABS: 1.0000 mg | ORAL_TABLET | Freq: Every day | ORAL | 2 refills | Status: DC

## 2016-02-21 NOTE — Telephone Encounter (Signed)
Due to insurance hassles will change to Risperidone

## 2016-02-21 NOTE — Addendum Note (Signed)
Addended byGolden Pop on: 02/21/2016 12:51 PM   Modules accepted: Orders

## 2016-02-21 NOTE — Telephone Encounter (Signed)
Received fax from pharmacy stating the trexulti is not formulary. Insurance prefers Risperidone, Quetiapine, Olanzapine, or Ziprasidone. Please advise.

## 2016-03-12 ENCOUNTER — Other Ambulatory Visit: Payer: Self-pay

## 2016-03-12 MED ORDER — PANTOPRAZOLE SODIUM 40 MG PO TBEC: 40.0000 mg | DELAYED_RELEASE_TABLET | Freq: Every day | ORAL | 3 refills | Status: DC

## 2016-03-12 NOTE — Telephone Encounter (Signed)
  Last routine OV: 02/13/16 Next OV: 03/19/16

## 2016-03-14 ENCOUNTER — Other Ambulatory Visit: Payer: Self-pay

## 2016-03-14 DIAGNOSIS — E7800 Pure hypercholesterolemia, unspecified: Secondary | ICD-10-CM

## 2016-03-14 MED ORDER — ATORVASTATIN CALCIUM 40 MG PO TABS: 40.0000 mg | ORAL_TABLET | Freq: Every day | ORAL | 0 refills | Status: DC

## 2016-03-14 NOTE — Telephone Encounter (Signed)
Last OV: 02/13/2016 Next OV: 03/19/16  Lab Results  Component Value Date   CHOL 108 02/13/2016   HDL 40 02/13/2016   LDLCALC 58 02/13/2016   TRIG 52 02/13/2016   CHOLHDL 2.7 02/13/2016   Lab Results  Component Value Date   CREATININE 0.90 02/13/2016   BUN 10 02/13/2016   NA 142 02/13/2016   K 4.2 02/13/2016   CL 103 02/13/2016   CO2 25 02/13/2016   Pt would like to received from mail order pharmacy.

## 2016-03-19 ENCOUNTER — Ambulatory Visit: Payer: 59 | Admitting: Family Medicine

## 2016-03-20 ENCOUNTER — Other Ambulatory Visit: Payer: Self-pay

## 2016-03-20 MED ORDER — RANITIDINE HCL 150 MG PO TABS: 150.0000 mg | ORAL_TABLET | Freq: Every day | ORAL | 7 refills | Status: DC

## 2016-03-20 NOTE — Telephone Encounter (Signed)
Last (acute) OV: 02/13/16 Last routine OV: 02/13/16 Next OV: 04/02/16

## 2016-04-02 ENCOUNTER — Ambulatory Visit (INDEPENDENT_AMBULATORY_CARE_PROVIDER_SITE_OTHER): Payer: 59 | Admitting: Family Medicine

## 2016-04-02 ENCOUNTER — Encounter: Payer: Self-pay | Admitting: Family Medicine

## 2016-04-02 DIAGNOSIS — F331 Major depressive disorder, recurrent, moderate: Secondary | ICD-10-CM

## 2016-04-02 DIAGNOSIS — I10 Essential (primary) hypertension: Secondary | ICD-10-CM

## 2016-04-02 MED ORDER — RISPERIDONE 2 MG PO TABS: 2.0000 mg | ORAL_TABLET | Freq: Every day | ORAL | 5 refills | Status: DC

## 2016-04-02 NOTE — Assessment & Plan Note (Signed)
Depression somewhat improved but further control needed will increase Risperdal from 1 mg to 2 mg.

## 2016-04-02 NOTE — Assessment & Plan Note (Signed)
The current medical regimen is effective;  continue present plan and medications.  

## 2016-04-02 NOTE — Progress Notes (Signed)
BP 126/81   Pulse 97   Ht 5' 10"  (1.778 m)   Wt 206 lb (93.4 kg)   SpO2 98%   BMI 29.56 kg/m    Subjective:    Patient ID: Bernard [name], male    DOB: 26-Jan-1976, 41 y.o.   MRN: [id number]  HPI: Bernard [name] is a 41 y.o. male  Chief Complaint  Patient presents with  . Follow-up   Patient from diabetes who points doing very well has on insulin pump and autonomous insulin dosing with continuous glucose monitoring. Patient's glucose is clearly doing better. Nerves with risperidone have improved with been able to quit smoking and has not lost anything from his Abilify dosing but hasn't really improved. Is hoping for better improvement with sensitivity to environment or something along that line. Is interested in possibly increasing the dose to see if better control of mood is available.  Relevant past medical, surgical, family and social history reviewed and updated as indicated. Interim medical history since our last visit reviewed. Allergies and medications reviewed and updated.  Review of Systems  Constitutional: Negative.   Respiratory: Negative.   Cardiovascular: Negative.     Per HPI unless specifically indicated above     Objective:    BP 126/81   Pulse 97   Ht 5' 10"  (1.778 m)   Wt 206 lb (93.4 kg)   SpO2 98%   BMI 29.56 kg/m   Wt Readings from Last 3 Encounters:  04/02/16 206 lb (93.4 kg)  02/13/16 194 lb 9.6 oz (88.3 kg)  09/14/15 192 lb (87.1 kg)    Physical Exam  Constitutional: He is oriented to person, place, and time. He appears well-developed and well-nourished.  HENT:  Head: Normocephalic and atraumatic.  Eyes: Conjunctivae and EOM are normal.  Neck: Normal range of motion.  Cardiovascular: Normal rate, regular rhythm and normal heart sounds.   Pulmonary/Chest: Effort normal and breath sounds normal.  Musculoskeletal: Normal range of motion.  Neurological: He is alert and oriented to person, place, and time.  Skin: No  erythema.  Psychiatric: He has a normal mood and affect. His behavior is normal. Judgment and thought content normal.    Results for orders placed or performed in visit on 02/13/16  Microscopic Examination  Result Value Ref Range   WBC, UA 0-5 0 - 5 /hpf   RBC, UA None seen 0 - 2 /hpf   Epithelial Cells (non renal) 0-10 0 - 10 /hpf   Bacteria, UA None seen None seen/Few  CBC with Differential/Platelet  Result Value Ref Range   WBC 6.1 3.4 - 10.8 x10E3/uL   RBC 4.86 4.14 - 5.80 x10E6/uL   Hemoglobin 14.8 13.0 - 17.7 g/dL   Hematocrit 41.7 37.5 - 51.0 %   MCV 86 79 - 97 fL   MCH 30.5 26.6 - 33.0 pg   MCHC 35.5 31.5 - 35.7 g/dL   RDW 13.0 12.3 - 15.4 %   Platelets 225 150 - 379 x10E3/uL   Neutrophils 60 Not Estab. %   Lymphs 25 Not Estab. %   Monocytes 8 Not Estab. %   Eos 6 Not Estab. %   Basos 0 Not Estab. %   Neutrophils Absolute 3.7 1.4 - 7.0 x10E3/uL   Lymphocytes Absolute 1.5 0.7 - 3.1 x10E3/uL   Monocytes Absolute 0.5 0.1 - 0.9 x10E3/uL   EOS (ABSOLUTE) 0.4 0.0 - 0.4 x10E3/uL   Basophils Absolute 0.0 0.0 - 0.2 x10E3/uL   Immature Granulocytes 1 Not  Estab. %   Immature Grans (Abs) 0.0 0.0 - 0.1 x10E3/uL  Comprehensive metabolic panel  Result Value Ref Range   Glucose 177 (H) 65 - 99 mg/dL   BUN 10 6 - 20 mg/dL   Creatinine, Ser 0.90 0.76 - 1.27 mg/dL   GFR calc non Af Amer 107 >59 mL/min/1.73   GFR calc Af Amer 124 >59 mL/min/1.73   BUN/Creatinine Ratio 11 9 - 20   Sodium 142 134 - 144 mmol/L   Potassium 4.2 3.5 - 5.2 mmol/L   Chloride 103 96 - 106 mmol/L   CO2 25 18 - 29 mmol/L   Calcium 9.3 8.7 - 10.2 mg/dL   Total Protein 6.4 6.0 - 8.5 g/dL   Albumin 4.6 3.5 - 5.5 g/dL   Globulin, Total 1.8 1.5 - 4.5 g/dL   Albumin/Globulin Ratio 2.6 (H) 1.2 - 2.2   Bilirubin Total 0.5 0.0 - 1.2 mg/dL   Alkaline Phosphatase 60 39 - 117 IU/L   AST 26 0 - 40 IU/L   ALT 46 (H) 0 - 44 IU/L  UA/M w/rflx Culture, Routine  Result Value Ref Range   Specific Gravity, UA 1.025  1.005 - 1.030   pH, UA 6.0 5.0 - 7.5   Color, UA Yellow Yellow   Appearance Ur Clear Clear   Leukocytes, UA Negative Negative   Protein, UA Negative Negative/Trace   Glucose, UA Negative Negative   Ketones, UA Negative Negative   RBC, UA Negative Negative   Bilirubin, UA Negative Negative   Urobilinogen, Ur 0.2 0.2 - 1.0 mg/dL   Nitrite, UA Negative Negative   Microscopic Examination See below:   Lipid panel  Result Value Ref Range   Cholesterol, Total 108 100 - 199 mg/dL   Triglycerides 52 0 - 149 mg/dL   HDL 40 >39 mg/dL   VLDL Cholesterol Cal 10 5 - 40 mg/dL   LDL Calculated 58 0 - 99 mg/dL   Chol/HDL Ratio 2.7 0.0 - 5.0 ratio units  TSH  Result Value Ref Range   TSH 1.660 0.450 - 4.500 uIU/mL      Assessment & Plan:   Problem List Items Addressed This Visit      Cardiovascular and Mediastinum   Hypertension    The current medical regimen is effective;  continue present plan and medications.         Other   Depression    Depression somewhat improved but further control needed will increase Risperdal from 1 mg to 2 mg.          Follow up plan: Return if still nerves recheck 1-2 mo, for 5 mo , BMP,  Lipids, ALT, AST.

## 2016-04-26 ENCOUNTER — Other Ambulatory Visit: Payer: Self-pay | Admitting: Family Medicine

## 2016-04-26 DIAGNOSIS — E7800 Pure hypercholesterolemia, unspecified: Secondary | ICD-10-CM

## 2016-05-07 ENCOUNTER — Telehealth: Payer: Self-pay | Admitting: Family Medicine

## 2016-05-07 NOTE — Telephone Encounter (Signed)
Do you know which medication?

## 2016-05-07 NOTE — Telephone Encounter (Signed)
Pt called and stated that at his endocrinology appt he was told that he would need to either decrease dosage or change medication due to weight gain. Pt also stated that his pharmacy will only fill this medication for 90 day supply. Pharmacy is [facility] order.

## 2016-05-07 NOTE — Telephone Encounter (Signed)
Sorry about that it's risperiDONE (RISPERDAL) 2 MG tablet

## 2016-05-14 ENCOUNTER — Telehealth: Payer: Self-pay | Admitting: Family Medicine

## 2016-05-14 NOTE — Telephone Encounter (Signed)
Patient states that he has not heard back from his previous call on 05/07/16 regarding Risperdal.  Please call the patient to let him know once a decision is made as he states that he must contact the pharmacy to provide claim information before they fill the prescription.  Message from 05/07/16:  Pt called and stated that at his endocrinology appt he was told that he would need to either decrease dosage or change medication due to weight gain. Pt also stated that his pharmacy will only fill this medication for 90 day supply. [facility]acy is [facility] order.

## 2016-05-15 NOTE — Telephone Encounter (Signed)
I don't know what happened with his previous message. Please let patient know that Dr. Jeananne Rama is out of the office and will call him next week when he returns.

## 2016-05-15 NOTE — Telephone Encounter (Signed)
Please advise 

## 2016-05-21 NOTE — Telephone Encounter (Signed)
Please advise 

## 2016-05-21 NOTE — Telephone Encounter (Signed)
Call pt 

## 2016-05-22 MED ORDER — RISPERIDONE 1 MG PO TABS: 1.0000 mg | ORAL_TABLET | Freq: Every day | ORAL | 3 refills | Status: DC

## 2016-05-22 NOTE — Telephone Encounter (Signed)
Patient called. Transferred call to [facility]

## 2016-05-22 NOTE — Telephone Encounter (Signed)
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

## 2016-05-22 NOTE — Telephone Encounter (Signed)
Advised we would call back after provider done seeing patients.

## 2016-06-04 ENCOUNTER — Ambulatory Visit (INDEPENDENT_AMBULATORY_CARE_PROVIDER_SITE_OTHER): Payer: 59 | Admitting: Family Medicine

## 2016-06-04 ENCOUNTER — Encounter: Payer: Self-pay | Admitting: Family Medicine

## 2016-06-04 ENCOUNTER — Other Ambulatory Visit: Payer: Self-pay

## 2016-06-04 DIAGNOSIS — K219 Gastro-esophageal reflux disease without esophagitis: Secondary | ICD-10-CM | POA: Insufficient documentation

## 2016-06-04 DIAGNOSIS — R5383 Other fatigue: Secondary | ICD-10-CM

## 2016-06-04 DIAGNOSIS — E7800 Pure hypercholesterolemia, unspecified: Secondary | ICD-10-CM

## 2016-06-04 DIAGNOSIS — I10 Essential (primary) hypertension: Secondary | ICD-10-CM

## 2016-06-04 MED ORDER — AMLODIPINE BESYLATE 5 MG PO TABS: 5.0000 mg | ORAL_TABLET | Freq: Every day | ORAL | 3 refills | Status: DC

## 2016-06-04 MED ORDER — ATORVASTATIN CALCIUM 40 MG PO TABS: 40.0000 mg | ORAL_TABLET | Freq: Every day | ORAL | 3 refills | Status: DC

## 2016-06-04 NOTE — Telephone Encounter (Signed)
Routing to provider, needs 90 day supply sent to mail order instead. Has appt this afternoon.

## 2016-06-04 NOTE — Assessment & Plan Note (Signed)
The current medical regimen is effective;  continue present plan and medications.  

## 2016-06-04 NOTE — Progress Notes (Signed)
BP 120/80   Pulse 94   Temp 98.2 F (36.8 C)   Wt 216 lb (98 kg)   SpO2 99%   BMI 30.99 kg/m    Subjective:    Patient ID: Bernard [name], male    DOB: Jun 17, 1975, 41 y.o.   MRN: [id number]  HPI: Bernard [name] is a 41 y.o. male  Chief Complaint  Patient presents with  . Depression  . Medication Refill    He needs a refill on Amlodipine.    Patient with multiple issues primarily regarding around sleep. Patient having a great deal of difficulty getting adequate sleep even though he is in bed approximately 10 hours at night. Has issues of wake him up including his insulin pump. Confusion about sleep apnea type symptoms with some snoring observed gasping for breath. No restless legs syndrome symptoms, daytime drowsiness does not affect driving but especially at lunchtime. If not woken up and we can sleep till noon without problems. Concerned risk for done may be affecting this has increased risperidone to 2 mg and seemed to help some of this drowsiness symptoms but had associated weight gain which of course is against type 1 diabetes Tablet to the confusion patient was changed his insulin pump during this time and A1c has dropped from 8.8 to 7.1. Patient also at night will wake up with hot water brash and acid in his mouth in spite of taking Protonix Zantac and propping up.'s happens about 5 times a week. Relevant past medical, surgical, family and social history reviewed and updated as indicated. Interim medical history since our last visit reviewed. Allergies and medications reviewed and updated.  Review of Systems  Constitutional: Negative.   Respiratory: Negative.   Cardiovascular: Negative.     Per HPI unless specifically indicated above     Objective:    BP 120/80   Pulse 94   Temp 98.2 F (36.8 C)   Wt 216 lb (98 kg)   SpO2 99%   BMI 30.99 kg/m   Wt Readings from Last 3 Encounters:  06/04/16 216 lb (98 kg)  04/02/16 206 lb (93.4 kg)  02/13/16  194 lb 9.6 oz (88.3 kg)    Physical Exam  Constitutional: He is oriented to person, place, and time. He appears well-developed and well-nourished.  HENT:  Head: Normocephalic and atraumatic.  Eyes: Conjunctivae and EOM are normal.  Neck: Normal range of motion.  Cardiovascular: Normal rate, regular rhythm and normal heart sounds.   Pulmonary/Chest: Effort normal and breath sounds normal.  Musculoskeletal: Normal range of motion.  Neurological: He is alert and oriented to person, place, and time.  Skin: No erythema.  Psychiatric: He has a normal mood and affect. His behavior is normal. Judgment and thought content normal.    Results for orders placed or performed in visit on 02/13/16  Microscopic Examination  Result Value Ref Range   WBC, UA 0-5 0 - 5 /hpf   RBC, UA None seen 0 - 2 /hpf   Epithelial Cells (non renal) 0-10 0 - 10 /hpf   Bacteria, UA None seen None seen/Few  CBC with Differential/Platelet  Result Value Ref Range   WBC 6.1 3.4 - 10.8 x10E3/uL   RBC 4.86 4.14 - 5.80 x10E6/uL   Hemoglobin 14.8 13.0 - 17.7 g/dL   Hematocrit 41.7 37.5 - 51.0 %   MCV 86 79 - 97 fL   MCH 30.5 26.6 - 33.0 pg   MCHC 35.5 31.5 - 35.7 g/dL  RDW 13.0 12.3 - 15.4 %   Platelets 225 150 - 379 x10E3/uL   Neutrophils 60 Not Estab. %   Lymphs 25 Not Estab. %   Monocytes 8 Not Estab. %   Eos 6 Not Estab. %   Basos 0 Not Estab. %   Neutrophils Absolute 3.7 1.4 - 7.0 x10E3/uL   Lymphocytes Absolute 1.5 0.7 - 3.1 x10E3/uL   Monocytes Absolute 0.5 0.1 - 0.9 x10E3/uL   EOS (ABSOLUTE) 0.4 0.0 - 0.4 x10E3/uL   Basophils Absolute 0.0 0.0 - 0.2 x10E3/uL   Immature Granulocytes 1 Not Estab. %   Immature Grans (Abs) 0.0 0.0 - 0.1 x10E3/uL  Comprehensive metabolic panel  Result Value Ref Range   Glucose 177 (H) 65 - 99 mg/dL   BUN 10 6 - 20 mg/dL   Creatinine, Ser 0.90 0.76 - 1.27 mg/dL   GFR calc non Af Amer 107 >59 mL/min/1.73   GFR calc Af Amer 124 >59 mL/min/1.73   BUN/Creatinine Ratio 11 9 -  20   Sodium 142 134 - 144 mmol/L   Potassium 4.2 3.5 - 5.2 mmol/L   Chloride 103 96 - 106 mmol/L   CO2 25 18 - 29 mmol/L   Calcium 9.3 8.7 - 10.2 mg/dL   Total Protein 6.4 6.0 - 8.5 g/dL   Albumin 4.6 3.5 - 5.5 g/dL   Globulin, Total 1.8 1.5 - 4.5 g/dL   Albumin/Globulin Ratio 2.6 (H) 1.2 - 2.2   Bilirubin Total 0.5 0.0 - 1.2 mg/dL   Alkaline Phosphatase 60 39 - 117 IU/L   AST 26 0 - 40 IU/L   ALT 46 (H) 0 - 44 IU/L  UA/M w/rflx Culture, Routine  Result Value Ref Range   Specific Gravity, UA 1.025 1.005 - 1.030   pH, UA 6.0 5.0 - 7.5   Color, UA Yellow Yellow   Appearance Ur Clear Clear   Leukocytes, UA Negative Negative   Protein, UA Negative Negative/Trace   Glucose, UA Negative Negative   Ketones, UA Negative Negative   RBC, UA Negative Negative   Bilirubin, UA Negative Negative   Urobilinogen, Ur 0.2 0.2 - 1.0 mg/dL   Nitrite, UA Negative Negative   Microscopic Examination See below:   Lipid panel  Result Value Ref Range   Cholesterol, Total 108 100 - 199 mg/dL   Triglycerides 52 0 - 149 mg/dL   HDL 40 >39 mg/dL   VLDL Cholesterol Cal 10 5 - 40 mg/dL   LDL Calculated 58 0 - 99 mg/dL   Chol/HDL Ratio 2.7 0.0 - 5.0 ratio units  TSH  Result Value Ref Range   TSH 1.660 0.450 - 4.500 uIU/mL      Assessment & Plan:   Problem List Items Addressed This Visit      Cardiovascular and Mediastinum   Hypertension    The current medical regimen is effective;  continue present plan and medications.       Relevant Medications   amLODipine (NORVASC) 5 MG tablet   atorvastatin (LIPITOR) 40 MG tablet     Digestive   Acid reflux    With prominent symptoms and failure of good medication will refer to GI to further evaluate.      Relevant Orders   Ambulatory referral to Gastroenterology     Other   Hyperlipidemia   Relevant Medications   amLODipine (NORVASC) 5 MG tablet   atorvastatin (LIPITOR) 40 MG tablet   Fatigue    Cause of ongoing fatigue and sleep apnea  type  symptoms will refer for sleep study          Follow up plan: Return in about 2 months (around 08/04/2016).

## 2016-06-04 NOTE — Assessment & Plan Note (Signed)
Cause of ongoing fatigue and sleep apnea type symptoms will refer for sleep study

## 2016-06-04 NOTE — Assessment & Plan Note (Signed)
With prominent symptoms and failure of good medication will refer to GI to further evaluate.

## 2016-06-25 ENCOUNTER — Other Ambulatory Visit: Payer: Self-pay | Admitting: Family Medicine

## 2016-06-25 MED ORDER — RANITIDINE HCL 150 MG PO TABS: 150.0000 mg | ORAL_TABLET | Freq: Every day | ORAL | 3 refills | Status: DC

## 2016-06-26 ENCOUNTER — Telehealth: Payer: Self-pay

## 2016-06-26 NOTE — Telephone Encounter (Signed)
Transferred to provider to discuss sleep study

## 2016-06-26 NOTE — Telephone Encounter (Signed)
Phone call Discussed with patient sleep study showing problems with when lying on his back otherwise no sleep apnea on his sides. Discuss side sleep position assisting device with patient patient indicates he understands and will comply. Also recommended weight loss

## 2016-07-10 ENCOUNTER — Other Ambulatory Visit: Payer: Self-pay | Admitting: Family Medicine

## 2016-07-10 DIAGNOSIS — E7800 Pure hypercholesterolemia, unspecified: Secondary | ICD-10-CM

## 2016-07-11 ENCOUNTER — Encounter: Payer: Self-pay | Admitting: Family Medicine

## 2016-07-11 ENCOUNTER — Ambulatory Visit (INDEPENDENT_AMBULATORY_CARE_PROVIDER_SITE_OTHER): Payer: 59 | Admitting: Family Medicine

## 2016-07-11 DIAGNOSIS — I10 Essential (primary) hypertension: Secondary | ICD-10-CM

## 2016-07-11 DIAGNOSIS — F331 Major depressive disorder, recurrent, moderate: Secondary | ICD-10-CM

## 2016-07-11 MED ORDER — ARIPIPRAZOLE 5 MG PO TABS: 5.0000 mg | ORAL_TABLET | Freq: Every day | ORAL | 1 refills | Status: DC

## 2016-07-11 NOTE — Progress Notes (Signed)
BP 136/74   Pulse 99   Wt 216 lb (98 kg)   SpO2 100%   BMI 30.99 kg/m    Subjective:    Patient ID: Bernard [name], male    DOB: 1975/07/13, 41 y.o.   MRN: [id number]  HPI: Bernard [name] is a 41 y.o. male  Chief Complaint  Patient presents with  . Follow-up  . Anxiety  . Foot Swelling    x 2 days, was on training exercise over the weekend.   Patient with several issues #1 marked foot swelling which developed this Monday after a weekend on his feet. No PND orthopnea symptoms. Patient still having same symptoms he was sent to the sleep lab for. No diagnosis of sleep apnea. Patient not sleeping on his back. Reviewed patient's depression nerves anxiety which is gotten really bad especially with reduced dose of respiratory or from 2 mg to 1 mg. Patient's quit gaining weight with reduced dose but nurse gotten worse. On review patient's taking Abilify 2.5 mg in the past which helped some. Switch to Risperdal to see if would help more. Patient still bothered by great deal of anger issues. Patient's wife assisted with history of anger and nerves. Relevant past medical, surgical, family and social history reviewed and updated as indicated. Interim medical history since our last visit reviewed. Allergies and medications reviewed and updated.  Review of Systems  Constitutional: Negative.   Respiratory: Negative.   Cardiovascular: Negative.     Per HPI unless specifically indicated above     Objective:    BP 136/74   Pulse 99   Wt 216 lb (98 kg)   SpO2 100%   BMI 30.99 kg/m   Wt Readings from Last 3 Encounters:  07/11/16 216 lb (98 kg)  06/04/16 216 lb (98 kg)  04/02/16 206 lb (93.4 kg)    Physical Exam  Constitutional: He is oriented to person, place, and time. He appears well-developed and well-nourished.  HENT:  Head: Normocephalic and atraumatic.  Eyes: Conjunctivae and EOM are normal.  Neck: Normal range of motion.  Cardiovascular: Normal rate, regular  rhythm and normal heart sounds.   Pulmonary/Chest: Effort normal and breath sounds normal.  Musculoskeletal: Normal range of motion.  Neurological: He is alert and oriented to person, place, and time.  Skin: No erythema.  Psychiatric: He has a normal mood and affect. His behavior is normal. Judgment and thought content normal.    Results for orders placed or performed in visit on 02/13/16  Microscopic Examination  Result Value Ref Range   WBC, UA 0-5 0 - 5 /hpf   RBC, UA None seen 0 - 2 /hpf   Epithelial Cells (non renal) 0-10 0 - 10 /hpf   Bacteria, UA None seen None seen/Few  CBC with Differential/Platelet  Result Value Ref Range   WBC 6.1 3.4 - 10.8 x10E3/uL   RBC 4.86 4.14 - 5.80 x10E6/uL   Hemoglobin 14.8 13.0 - 17.7 g/dL   Hematocrit 41.7 37.5 - 51.0 %   MCV 86 79 - 97 fL   MCH 30.5 26.6 - 33.0 pg   MCHC 35.5 31.5 - 35.7 g/dL   RDW 13.0 12.3 - 15.4 %   Platelets 225 150 - 379 x10E3/uL   Neutrophils 60 Not Estab. %   Lymphs 25 Not Estab. %   Monocytes 8 Not Estab. %   Eos 6 Not Estab. %   Basos 0 Not Estab. %   Neutrophils Absolute 3.7 1.4 - 7.0  x10E3/uL   Lymphocytes Absolute 1.5 0.7 - 3.1 x10E3/uL   Monocytes Absolute 0.5 0.1 - 0.9 x10E3/uL   EOS (ABSOLUTE) 0.4 0.0 - 0.4 x10E3/uL   Basophils Absolute 0.0 0.0 - 0.2 x10E3/uL   Immature Granulocytes 1 Not Estab. %   Immature Grans (Abs) 0.0 0.0 - 0.1 x10E3/uL  Comprehensive metabolic panel  Result Value Ref Range   Glucose 177 (H) 65 - 99 mg/dL   BUN 10 6 - 20 mg/dL   Creatinine, Ser 0.90 0.76 - 1.27 mg/dL   GFR calc non Af Amer 107 >59 mL/min/1.73   GFR calc Af Amer 124 >59 mL/min/1.73   BUN/Creatinine Ratio 11 9 - 20   Sodium 142 134 - 144 mmol/L   Potassium 4.2 3.5 - 5.2 mmol/L   Chloride 103 96 - 106 mmol/L   CO2 25 18 - 29 mmol/L   Calcium 9.3 8.7 - 10.2 mg/dL   Total Protein 6.4 6.0 - 8.5 g/dL   Albumin 4.6 3.5 - 5.5 g/dL   Globulin, Total 1.8 1.5 - 4.5 g/dL   Albumin/Globulin Ratio 2.6 (H) 1.2 - 2.2    Bilirubin Total 0.5 0.0 - 1.2 mg/dL   Alkaline Phosphatase 60 39 - 117 IU/L   AST 26 0 - 40 IU/L   ALT 46 (H) 0 - 44 IU/L  UA/M w/rflx Culture, Routine  Result Value Ref Range   Specific Gravity, UA 1.025 1.005 - 1.030   pH, UA 6.0 5.0 - 7.5   Color, UA Yellow Yellow   Appearance Ur Clear Clear   Leukocytes, UA Negative Negative   Protein, UA Negative Negative/Trace   Glucose, UA Negative Negative   Ketones, UA Negative Negative   RBC, UA Negative Negative   Bilirubin, UA Negative Negative   Urobilinogen, Ur 0.2 0.2 - 1.0 mg/dL   Nitrite, UA Negative Negative   Microscopic Examination See below:   Lipid panel  Result Value Ref Range   Cholesterol, Total 108 100 - 199 mg/dL   Triglycerides 52 0 - 149 mg/dL   HDL 40 >39 mg/dL   VLDL Cholesterol Cal 10 5 - 40 mg/dL   LDL Calculated 58 0 - 99 mg/dL   Chol/HDL Ratio 2.7 0.0 - 5.0 ratio units  TSH  Result Value Ref Range   TSH 1.660 0.450 - 4.500 uIU/mL      Assessment & Plan:   Problem List Items Addressed This Visit      Cardiovascular and Mediastinum   Hypertension    Discussed foot swelling may be related to amlodipine. Of note patient's been on amlodipine long-term with no history of foot swelling. Patient was on his feet a lot this last weekend which may be contributing. Discuss elevation modest salt and will observe foot swelling if continues to be a problem to consider stopping amlodipine and switching to another medication.        Other   Depression    Discuss risperidone 2 mg helps but had weight gain with decreased 1 mg inadequate care of anxiety depression nerves. Discussed token Abilify in the past 2.5 mg a day and did adequate will switch to that and referral to psychiatry to further evaluate depression and irritability.      Relevant Orders   Ambulatory referral to Psychiatry       Follow up plan: Return for As scheduled.

## 2016-07-11 NOTE — Assessment & Plan Note (Signed)
Discussed foot swelling may be related to amlodipine. Of note patient's been on amlodipine long-term with no history of foot swelling. Patient was on his feet a lot this last weekend which may be contributing. Discuss elevation modest salt and will observe foot swelling if continues to be a problem to consider stopping amlodipine and switching to another medication.

## 2016-07-11 NOTE — Assessment & Plan Note (Signed)
Discuss risperidone 2 mg helps but had weight gain with decreased 1 mg inadequate care of anxiety depression nerves. Discussed token Abilify in the past 2.5 mg a day and did adequate will switch to that and referral to psychiatry to further evaluate depression and irritability.

## 2016-07-24 ENCOUNTER — Other Ambulatory Visit: Payer: Self-pay | Admitting: Family Medicine

## 2016-07-24 DIAGNOSIS — E7800 Pure hypercholesterolemia, unspecified: Secondary | ICD-10-CM

## 2016-07-30 ENCOUNTER — Ambulatory Visit (INDEPENDENT_AMBULATORY_CARE_PROVIDER_SITE_OTHER): Payer: 59 | Admitting: Psychiatry

## 2016-07-30 ENCOUNTER — Encounter: Payer: Self-pay | Admitting: Psychiatry

## 2016-07-30 ENCOUNTER — Telehealth: Payer: Self-pay

## 2016-07-30 VITALS — BP 132/80 | HR 104 | Temp 98.7°F | Wt 214.8 lb

## 2016-07-30 DIAGNOSIS — F411 Generalized anxiety disorder: Secondary | ICD-10-CM | POA: Diagnosis not present

## 2016-07-30 DIAGNOSIS — F331 Major depressive disorder, recurrent, moderate: Secondary | ICD-10-CM | POA: Diagnosis not present

## 2016-07-30 MED ORDER — BUPROPION HCL 75 MG PO TABS: 75.0000 mg | ORAL_TABLET | Freq: Every morning | ORAL | 1 refills | Status: DC

## 2016-07-30 MED ORDER — BUPROPION HCL 75 MG PO TABS: 75.0000 mg | ORAL_TABLET | Freq: Three times a day (TID) | ORAL | 2 refills | Status: DC

## 2016-07-30 NOTE — Telephone Encounter (Signed)
pharmacy called they state that they received a wellbutrin rx for 3x a day which they have already filled and pt has picked up then they received another on with once every morining.  they need to find out which rx is correct and to also let you know that the patient has already picked up the rx for 3 times a day.

## 2016-07-30 NOTE — Telephone Encounter (Signed)
spoke with pharmist. told them that per your verbal order that the rx was suppose to have been the wellbutrin 11m once daily (not the 3 times daily) they made correction.  they were also advised to delete any additional refills because pt just received 3 month supply.

## 2016-07-30 NOTE — Telephone Encounter (Signed)
I cancelled the prescription for 3 times daily. He needs to take the wellbutrin 79m once daily. Please call patient and have him return it to pharmacy.

## 2016-07-30 NOTE — Progress Notes (Signed)
Psychiatric Initial Adult Assessment   Patient Identification: Bernard [name] MRN:  [id number] Date of Evaluation:  07/30/2016 Referral Source: Dr.Crissman Chief Complaint:   Chief Complaint    Establish Care; Anxiety; Depression; Fatigue     Visit Diagnosis:    ICD-10-CM   1. MDD (major depressive disorder), recurrent episode, moderate (HCC) F33.1   2. GAD (generalized anxiety disorder) F41.1     History of Present Illness:  Patient is a 41 year old Caucasian male who was referred by his primary care physician for evaluation and treatment of depression and anxiety. Patient reports that he has significant fatigue and mood symptoms, some anger issues for the last several years. He was started on Abilify by his of his primary care physician and reports it does help to some extent with evening of his mood but he continues to stay fatigued and also overeats. He has some mood symptoms as well. States that the these symptoms have been occurring for several years at this point. Patient is married with 2 children and reports having a good marriage overall. He enjoys spending time with this children. He is college educated and works as a Market researcher for [employer]. Reports that he oversleeps most days. States when he wakes up he does feel sleepy a bit and does well until about lunch time but after that he begins to feel sleepy and always stays a bit tired. Patient denies any suicidal thoughts. He denies any problems with alcohol or drug abuse. Denies any manic symptoms. He does endorse some obsessive-compulsive thoughts and reports that he has to turn off a switch when he leaves the room. However he denies any ritualistic behaviors and any other OCD symptoms that interfere with his functioning. Patient does have diabetes and states that he overeats.  PHQ 9 of 9 Past Psychiatric History:   Previous Psychotropic Medications: Yes   Substance Abuse History in the last 12 months:   No.  Consequences of Substance Abuse: Negative  Past Medical History:  Past Medical History:  Diagnosis Date  . Anxiety   . Depression   . Esophageal stricture   . Hyperlipidemia   . Hypertension     Past Surgical History:  Procedure Laterality Date  . PILONIDAL CYST EXCISION    . WISDOM TOOTH EXTRACTION      Family Psychiatric History: see below  Family History:  Family History  Problem Relation Age of Onset  . Cancer Mother   . Anxiety disorder Mother   . Depression Mother   . Cancer Father   . Anxiety disorder Brother   . Depression Brother     Social History:   Social History   Social History  . Marital status: Married    Spouse name: N/A  . Number of children: N/A  . Years of education: N/A   Social History Main Topics  . Smoking status: Former Smoker    Packs/day: 0.50    Types: Cigarettes    Quit date: 02/06/2016  . Smokeless tobacco: Never Used  . Alcohol use 3.6 oz/week    6 Cans of beer per week     Comment: on occasion  . Drug use: No  . Sexual activity: Yes    Birth control/ protection: None   Other Topics Concern  . None   Social History Narrative  . None    Additional Social History: Lives with his wife and 2 children.  Allergies:  No Known Allergies  Metabolic Disorder Labs: No results found for: HGBA1C, MPG  No results found for: PROLACTIN Lab Results  Component Value Date   CHOL 108 02/13/2016   TRIG 52 02/13/2016   HDL 40 02/13/2016   CHOLHDL 2.7 02/13/2016   VLDL 27 07/11/2015   LDLCALC 58 02/13/2016   LDLCALC 77 12/01/2014     Current Medications: Current Outpatient Prescriptions  Medication Sig Dispense Refill  . amLODipine (NORVASC) 5 MG tablet Take 1 tablet (5 mg total) by mouth daily. 90 tablet 3  . ARIPiprazole (ABILIFY) 5 MG tablet Take 1 tablet (5 mg total) by mouth daily. 90 tablet 1  . atorvastatin (LIPITOR) 40 MG tablet TAKE 1 TABLET DAILY 90 tablet 0  . glucose blood (BAYER CONTOUR TEST) test strip Use  3 (three) times daily. Use as instructed. TID Contour next E10.65    . insulin lispro (HUMALOG) 100 UNIT/ML injection Use as directed in insulin pump. MDD: 90 units. E10.65    . Insulin Pen Needle 29G X 12.7MM MISC 1 each by Does not apply route 3 (three) times daily. 100 each 12  . ondansetron (ZOFRAN ODT) 4 MG disintegrating tablet Take 1 tablet (4 mg total) by mouth every 8 (eight) hours as needed for nausea or vomiting. 30 tablet 0  . pantoprazole (PROTONIX) 40 MG tablet Take by mouth.    . ranitidine (ZANTAC) 150 MG tablet Take 1 tablet (150 mg total) by mouth at bedtime. 90 tablet 3  . buPROPion (WELLBUTRIN) 75 MG tablet Take 1 tablet (75 mg total) by mouth every morning. 30 tablet 1   No current facility-administered medications for this visit.     Neurologic: Headache: No Seizure: No Paresthesias:No  Musculoskeletal: Strength & Muscle Tone: within normal limits Gait & Station: normal Patient leans: N/A  Psychiatric Specialty Exam: ROS  Blood pressure 132/80, pulse (!) 104, temperature 98.7 F (37.1 C), temperature source Oral, weight 214 lb 12.8 oz (97.4 kg).Body mass index is 30.82 kg/m.  General Appearance: Casual  Eye Contact:  Fair  Speech:  Clear and Coherent  Volume:  Normal  Mood:  Anxious, Depressed and Dysphoric  Affect:  Congruent  Thought Process:  Coherent  Orientation:  Full (Time, Place, and Person)  Thought Content:  Logical  Suicidal Thoughts:  No  Homicidal Thoughts:  No  Memory:  Immediate;   Fair Recent;   Fair Remote;   Fair  Judgement:  Fair  Insight:  Fair  Psychomotor Activity:  Normal  Concentration:  Concentration: Fair and Attention Span: Fair  Recall:  [employer] of Knowledge:Fair  Language: Fair  Akathisia:  No  Handed:  Right  AIMS (if indicated):    Assets:  Communication Skills Desire for Improvement Financial Resources/Insurance [place]  ADL's:  Intact  Cognition:  WNL  Sleep:  Sleeps too much    Treatment Plan Summary:  Major depressive disorder moderate Start Wellbutrin at 75 mg daily in the morning. We will continue the Abilify for now. Plan to taper him off the Abilify at next visit. Will monitor to see if patient will need the services of a therapist.  Generalized anxiety disorder Same as above  Return to clinic in 2 weeks time or call before if needed     Elvin So, MD 6/25/20181:32 PM

## 2016-07-30 NOTE — Telephone Encounter (Signed)
pt was told that per Dr. Einar Grad verbal order pt was only to take wellbutrin 24m once daily. not 3 times. pt was told this and he will make a note on the medication bottle . pt was told that the pharmacy would be called to be notified of the correct dosages and that if there was any refills on the bottle they would be cancelled because he received a 3 month supply . Pt understood.

## 2016-07-31 NOTE — Telephone Encounter (Signed)
Ok

## 2016-08-06 ENCOUNTER — Ambulatory Visit (INDEPENDENT_AMBULATORY_CARE_PROVIDER_SITE_OTHER): Payer: 59 | Admitting: Family Medicine

## 2016-08-06 ENCOUNTER — Encounter: Payer: Self-pay | Admitting: Family Medicine

## 2016-08-06 VITALS — BP 145/89 | HR 89 | Temp 98.6°F | Resp 98 | Wt 213.0 lb

## 2016-08-06 DIAGNOSIS — E7800 Pure hypercholesterolemia, unspecified: Secondary | ICD-10-CM

## 2016-08-06 DIAGNOSIS — I10 Essential (primary) hypertension: Secondary | ICD-10-CM | POA: Diagnosis not present

## 2016-08-06 DIAGNOSIS — J019 Acute sinusitis, unspecified: Secondary | ICD-10-CM | POA: Insufficient documentation

## 2016-08-06 DIAGNOSIS — F331 Major depressive disorder, recurrent, moderate: Secondary | ICD-10-CM

## 2016-08-06 LAB — LP+ALT+AST PICCOLO, WAIVED
ALT (SGPT) PICCOLO, WAIVED: 31 U/L (ref 10–47)
AST (SGOT) Piccolo, Waived: 32 U/L (ref 11–38)
CHOLESTEROL PICCOLO, WAIVED: 151 mg/dL (ref <200)
Chol/HDL Ratio Piccolo,Waive: 2.4 mg/dL
HDL CHOL PICCOLO, WAIVED: 63 mg/dL (ref >59)
LDL Chol Calc Piccolo Waived: 75 mg/dL (ref <100)
TRIGLYCERIDES PICCOLO,WAIVED: 66 mg/dL (ref <150)
VLDL CHOL CALC PICCOLO,WAIVE: 13 mg/dL (ref <30)

## 2016-08-06 MED ORDER — AMOXICILLIN-POT CLAVULANATE 875-125 MG PO TABS: 1.0000 | ORAL_TABLET | Freq: Two times a day (BID) | ORAL | 0 refills | Status: DC

## 2016-08-06 NOTE — Progress Notes (Signed)
BP (!) 145/89   Pulse 89   Temp 98.6 F (37 C) (Oral)   Resp (!) 98   Wt 213 lb (96.6 kg)   BMI 30.56 kg/m    Subjective:    Patient ID: Bernard [name], male    DOB: 05/11/1975, 41 y.o.   MRN: [id number]  HPI: Bernard [name] is a 41 y.o. male  Chief Complaint  Patient presents with  . Follow-up  . Sore Throat    x 2 days.   Patient's other problems doing well with diabetes depression and GI symptoms all doing well. No complaints. Bothered now by mostly sore throat congestion drainage sinus pressure. Patient concerned because of type 1 diabetes. The symptoms seem to be stable not sure if there to be getting worse or not usual history is a fluctuating course.  Relevant past medical, surgical, family and social history reviewed and updated as indicated. Interim medical history since our last visit reviewed. Allergies and medications reviewed and updated.  Review of Systems  Constitutional: Positive for chills and fatigue. Negative for fever.  HENT: Positive for congestion, rhinorrhea, sinus pain, sinus pressure and sore throat.   Respiratory: Positive for cough.   Cardiovascular: Negative.     Per HPI unless specifically indicated above     Objective:    BP (!) 145/89   Pulse 89   Temp 98.6 F (37 C) (Oral)   Resp (!) 98   Wt 213 lb (96.6 kg)   BMI 30.56 kg/m   Wt Readings from Last 3 Encounters:  08/06/16 213 lb (96.6 kg)  07/11/16 216 lb (98 kg)  06/04/16 216 lb (98 kg)    Physical Exam  Constitutional: He is oriented to person, place, and time. He appears well-developed and well-nourished.  HENT:  Head: Normocephalic and atraumatic.  Right Ear: External ear normal.  Left Ear: External ear normal.  Mouth/Throat: Oropharyngeal exudate present.  Eyes: Conjunctivae and EOM are normal.  Neck: Normal range of motion. No thyromegaly present.  Cardiovascular: Normal rate, regular rhythm and normal heart sounds.   Pulmonary/Chest: Effort normal  and breath sounds normal.  Musculoskeletal: Normal range of motion.  Lymphadenopathy:    He has no cervical adenopathy.  Neurological: He is alert and oriented to person, place, and time.  Skin: No erythema.  Psychiatric: He has a normal mood and affect. His behavior is normal. Judgment and thought content normal.    Results for orders placed or performed in visit on 02/13/16  Microscopic Examination  Result Value Ref Range   WBC, UA 0-5 0 - 5 /hpf   RBC, UA None seen 0 - 2 /hpf   Epithelial Cells (non renal) 0-10 0 - 10 /hpf   Bacteria, UA None seen None seen/Few  CBC with Differential/Platelet  Result Value Ref Range   WBC 6.1 3.4 - 10.8 x10E3/uL   RBC 4.86 4.14 - 5.80 x10E6/uL   Hemoglobin 14.8 13.0 - 17.7 g/dL   Hematocrit 41.7 37.5 - 51.0 %   MCV 86 79 - 97 fL   MCH 30.5 26.6 - 33.0 pg   MCHC 35.5 31.5 - 35.7 g/dL   RDW 13.0 12.3 - 15.4 %   Platelets 225 150 - 379 x10E3/uL   Neutrophils 60 Not Estab. %   Lymphs 25 Not Estab. %   Monocytes 8 Not Estab. %   Eos 6 Not Estab. %   Basos 0 Not Estab. %   Neutrophils Absolute 3.7 1.4 - 7.0 x10E3/uL  Lymphocytes Absolute 1.5 0.7 - 3.1 x10E3/uL   Monocytes Absolute 0.5 0.1 - 0.9 x10E3/uL   EOS (ABSOLUTE) 0.4 0.0 - 0.4 x10E3/uL   Basophils Absolute 0.0 0.0 - 0.2 x10E3/uL   Immature Granulocytes 1 Not Estab. %   Immature Grans (Abs) 0.0 0.0 - 0.1 x10E3/uL  Comprehensive metabolic panel  Result Value Ref Range   Glucose 177 (H) 65 - 99 mg/dL   BUN 10 6 - 20 mg/dL   Creatinine, Ser 0.90 0.76 - 1.27 mg/dL   GFR calc non Af Amer 107 >59 mL/min/1.73   GFR calc Af Amer 124 >59 mL/min/1.73   BUN/Creatinine Ratio 11 9 - 20   Sodium 142 134 - 144 mmol/L   Potassium 4.2 3.5 - 5.2 mmol/L   Chloride 103 96 - 106 mmol/L   CO2 25 18 - 29 mmol/L   Calcium 9.3 8.7 - 10.2 mg/dL   Total Protein 6.4 6.0 - 8.5 g/dL   Albumin 4.6 3.5 - 5.5 g/dL   Globulin, Total 1.8 1.5 - 4.5 g/dL   Albumin/Globulin Ratio 2.6 (H) 1.2 - 2.2   Bilirubin  Total 0.5 0.0 - 1.2 mg/dL   Alkaline Phosphatase 60 39 - 117 IU/L   AST 26 0 - 40 IU/L   ALT 46 (H) 0 - 44 IU/L  UA/M w/rflx Culture, Routine  Result Value Ref Range   Specific Gravity, UA 1.025 1.005 - 1.030   pH, UA 6.0 5.0 - 7.5   Color, UA Yellow Yellow   Appearance Ur Clear Clear   Leukocytes, UA Negative Negative   Protein, UA Negative Negative/Trace   Glucose, UA Negative Negative   Ketones, UA Negative Negative   RBC, UA Negative Negative   Bilirubin, UA Negative Negative   Urobilinogen, Ur 0.2 0.2 - 1.0 mg/dL   Nitrite, UA Negative Negative   Microscopic Examination See below:   Lipid panel  Result Value Ref Range   Cholesterol, Total 108 100 - 199 mg/dL   Triglycerides 52 0 - 149 mg/dL   HDL 40 >39 mg/dL   VLDL Cholesterol Cal 10 5 - 40 mg/dL   LDL Calculated 58 0 - 99 mg/dL   Chol/HDL Ratio 2.7 0.0 - 5.0 ratio units  TSH  Result Value Ref Range   TSH 1.660 0.450 - 4.500 uIU/mL      Assessment & Plan:   Problem List Items Addressed This Visit      Cardiovascular and Mediastinum   Hypertension - Primary   Relevant Orders   Basic metabolic panel   LP+ALT+AST Piccolo, Waived     Respiratory   Acute sinusitis    Early sinusitis signs and symptoms will give an antibiotic to not take but observe symptoms if getting worse patient will start patient education given on how and when to take medication. Patient education given on type 1 diabetes and medication Discuss over-the-counter Mucinex Tylenol sinus nasal rinse etc.      Relevant Medications   amoxicillin-clavulanate (AUGMENTIN) 875-125 MG tablet     Other   Depression    Doing much better psychiatrist started low-dose Wellbutrin and having more energy.      Hyperlipidemia   Relevant Orders   Basic metabolic panel   LP+ALT+AST Piccolo, Waived       Follow up plan: Return if symptoms worsen or fail to improve, for As scheduled.

## 2016-08-06 NOTE — Assessment & Plan Note (Signed)
Early sinusitis signs and symptoms will give an antibiotic to not take but observe symptoms if getting worse patient will start patient education given on how and when to take medication. Patient education given on type 1 diabetes and medication Discuss over-the-counter Mucinex Tylenol sinus nasal rinse etc.

## 2016-08-06 NOTE — Assessment & Plan Note (Signed)
Doing much better psychiatrist started low-dose Wellbutrin and having more energy.

## 2016-08-07 ENCOUNTER — Encounter: Payer: Self-pay | Admitting: Family Medicine

## 2016-08-07 LAB — BASIC METABOLIC PANEL
BUN/Creatinine Ratio: 8 — ABNORMAL LOW (ref 9–20)
BUN: 8 mg/dL (ref 6–24)
CO2: 22 mmol/L (ref 20–29)
CREATININE: 0.95 mg/dL (ref 0.76–1.27)
Calcium: 9.6 mg/dL (ref 8.7–10.2)
Chloride: 100 mmol/L (ref 96–106)
GFR, EST AFRICAN AMERICAN: 115 mL/min/{1.73_m2} (ref >59)
GFR, EST NON AFRICAN AMERICAN: 100 mL/min/{1.73_m2} (ref >59)
Glucose: 186 mg/dL — ABNORMAL HIGH (ref 65–99)
POTASSIUM: 4.3 mmol/L (ref 3.5–5.2)
SODIUM: 140 mmol/L (ref 134–144)

## 2016-08-13 ENCOUNTER — Ambulatory Visit (INDEPENDENT_AMBULATORY_CARE_PROVIDER_SITE_OTHER): Payer: 59 | Admitting: Psychiatry

## 2016-08-13 ENCOUNTER — Encounter: Payer: Self-pay | Admitting: Psychiatry

## 2016-08-13 VITALS — BP 146/90 | HR 86 | Temp 99.1°F | Wt 212.0 lb

## 2016-08-13 DIAGNOSIS — F411 Generalized anxiety disorder: Secondary | ICD-10-CM

## 2016-08-13 DIAGNOSIS — F331 Major depressive disorder, recurrent, moderate: Secondary | ICD-10-CM

## 2016-08-13 NOTE — Progress Notes (Signed)
Psychiatric progress note  Patient Identification: Bernard [name] MRN:  [id number] Date of Evaluation:  08/13/2016 Referral Source: Dr.Crissman Chief Complaint:  Doing much better Chief Complaint    Follow-up; Medication Refill     Visit Diagnosis:    ICD-10-CM   1. MDD (major depressive disorder), recurrent episode, moderate (HCC) F33.1   2. GAD (generalized anxiety disorder) F41.1     History of Present Illness:  Patient is a 41 year old Caucasian male who was referred by his primary care physician for evaluation and treatment of depression and anxiety. Patient reports today for followup. Reports he has tolerated the wellbutrin well. His energy levels have improved, waking up easily. Not as angry. Getting work done more efficiently. Good sleep and appetite. Continues to take the Abilify at 2.59m daily, interested in weaning off.  Past Psychiatric History: No hospitalizations.  Previous Psychotropic Medications: Yes   Substance Abuse History in the last 12 months:  No.  Consequences of Substance Abuse: Negative  Past Medical History:  Past Medical History:  Diagnosis Date  . Anxiety   . Depression   . Esophageal stricture   . Hyperlipidemia   . Hypertension     Past Surgical History:  Procedure Laterality Date  . PILONIDAL CYST EXCISION    . WISDOM TOOTH EXTRACTION      Family Psychiatric History: see below  Family History:  Family History  Problem Relation Age of Onset  . Cancer Mother   . Anxiety disorder Mother   . Depression Mother   . Cancer Father   . Anxiety disorder Brother   . Depression Brother     Social History:   Social History   Social History  . Marital status: Married    Spouse name: N/A  . Number of children: N/A  . Years of education: N/A   Social History Main Topics  . Smoking status: Former Smoker    Packs/day: 0.50    Types: Cigarettes    Quit date: 02/06/2016  . Smokeless tobacco: Never Used  . Alcohol use 3.6 oz/week     6 Cans of beer per week     Comment: on occasion  . Drug use: No  . Sexual activity: Yes    Birth control/ protection: None   Other Topics Concern  . None   Social History Narrative  . None    Additional Social History: Lives with his wife and 2 children.  Allergies:  No Known Allergies  Metabolic Disorder Labs: No results found for: HGBA1C, MPG No results found for: PROLACTIN Lab Results  Component Value Date   CHOL 151 08/06/2016   TRIG 66 08/06/2016   HDL 40 02/13/2016   CHOLHDL 2.7 02/13/2016   VLDL 13 08/06/2016   LDLCALC 58 02/13/2016   LDLCALC 77 12/01/2014     Current Medications: Current Outpatient Prescriptions  Medication Sig Dispense Refill  . amLODipine (NORVASC) 5 MG tablet Take 1 tablet (5 mg total) by mouth daily. 90 tablet 3  . amoxicillin-clavulanate (AUGMENTIN) 875-125 MG tablet Take 1 tablet by mouth 2 (two) times daily. 20 tablet 0  . ARIPiprazole (ABILIFY) 5 MG tablet Take 1 tablet (5 mg total) by mouth daily. 90 tablet 1  . atorvastatin (LIPITOR) 40 MG tablet TAKE 1 TABLET DAILY 90 tablet 0  . buPROPion (WELLBUTRIN) 75 MG tablet Take 1 tablet (75 mg total) by mouth every morning. 30 tablet 1  . glucose blood (BAYER CONTOUR TEST) test strip Use 3 (three) times daily. Use as instructed. TID  Contour next E10.65    . insulin lispro (HUMALOG) 100 UNIT/ML injection Use as directed in insulin pump. MDD: 90 units. E10.65    . Insulin Pen Needle 29G X 12.7MM MISC 1 each by Does not apply route 3 (three) times daily. 100 each 12  . ondansetron (ZOFRAN ODT) 4 MG disintegrating tablet Take 1 tablet (4 mg total) by mouth every 8 (eight) hours as needed for nausea or vomiting. 30 tablet 0  . pantoprazole (PROTONIX) 40 MG tablet Take 40 mg by mouth 2 (two) times daily.     . ranitidine (ZANTAC) 150 MG tablet Take 1 tablet (150 mg total) by mouth at bedtime. 90 tablet 3   No current facility-administered medications for this visit.      Neurologic: Headache: No Seizure: No Paresthesias:No  Musculoskeletal: Strength & Muscle Tone: within normal limits Gait & Station: normal Patient leans: N/A  Psychiatric Specialty Exam: ROS  Blood pressure (!) 146/90, pulse 86, temperature 99.1 F (37.3 C), temperature source Oral, weight 212 lb (96.2 kg).Body mass index is 30.42 kg/m.  General Appearance: Casual  Eye Contact:  Fair  Speech:  Clear and Coherent  Volume:  Normal  Mood:  Improved mood  Affect:  Congruent  Thought Process:  Coherent  Orientation:  Full (Time, Place, and Person)  Thought Content:  Logical  Suicidal Thoughts:  No  Homicidal Thoughts:  No  Memory:  Immediate;   Fair Recent;   Fair Remote;   Fair  Judgement:  Fair  Insight:  Fair  Psychomotor Activity:  Normal  Concentration:  Concentration: Fair and Attention Span: Fair  Recall:  [employer] of Knowledge:Fair  Language: Fair  Akathisia:  No  Handed:  Right  AIMS (if indicated):    Assets:  Communication Skills Desire for Improvement Financial Resources/Insurance Housing Physical Health Social Support Vocational/Educational  ADL's:  Intact  Cognition: WNL  Sleep:  improved    Treatment Plan Summary:  Major depressive disorder moderate Continue Wellbutrin at 75 mg daily in the morning.  Taper  off the Abilify and see how he does. Will monitor to see if patient will need the services of a therapist.  Generalized anxiety disorder Same as above  Return to clinic in 4 weeks time or call before if needed     Elvin So, MD 7/9/20189:23 AM

## 2016-09-18 ENCOUNTER — Ambulatory Visit (INDEPENDENT_AMBULATORY_CARE_PROVIDER_SITE_OTHER): Payer: 59 | Admitting: Psychiatry

## 2016-09-18 ENCOUNTER — Encounter: Payer: Self-pay | Admitting: Psychiatry

## 2016-09-18 VITALS — BP 145/91 | HR 103 | Temp 98.6°F | Wt 211.0 lb

## 2016-09-18 DIAGNOSIS — F411 Generalized anxiety disorder: Secondary | ICD-10-CM

## 2016-09-18 DIAGNOSIS — F331 Major depressive disorder, recurrent, moderate: Secondary | ICD-10-CM

## 2016-09-18 MED ORDER — BUPROPION HCL ER (XL) 150 MG PO TB24: 150.0000 mg | ORAL_TABLET | ORAL | 1 refills | Status: DC

## 2016-09-18 NOTE — Progress Notes (Signed)
Psychiatric progress note  Patient Identification: Bernard [name] MRN:  [id number] Date of Evaluation:  09/18/2016 Referral Source: Dr.Crissman Chief Complaint:  Doing much better Chief Complaint    Follow-up; Medication Refill     Visit Diagnosis:    ICD-10-CM   1. MDD (major depressive disorder), recurrent episode, moderate (HCC) F33.1   2. GAD (generalized anxiety disorder) F41.1     History of Present Illness:  Patient is a 41 year old Caucasian male who was referred by his primary care physician for evaluation and treatment of depression and anxiety. Patient reports today for followup. Reports he Had trouble tapering off the Abilify. States that he became very irritable like before. So he restarted taking the Abilify at 2.5 mg. States that overall his mood seems to be okay but he is sleeping too much and feeling tired quite a bit. Also reports having a lot of stress at home and at work but dealing okay. Denies any suicidal thoughts.  PHQ 9 of 5  Past Psychiatric History: No hospitalizations.  Previous Psychotropic Medications: Yes   Substance Abuse History in the last 12 months:  No.  Consequences of Substance Abuse: Negative  Past Medical History:  Past Medical History:  Diagnosis Date  . Anxiety   . Depression   . Esophageal stricture   . Hyperlipidemia   . Hypertension     Past Surgical History:  Procedure Laterality Date  . PILONIDAL CYST EXCISION    . WISDOM TOOTH EXTRACTION      Family Psychiatric History: see below  Family History:  Family History  Problem Relation Age of Onset  . Cancer Mother   . Anxiety disorder Mother   . Depression Mother   . Cancer Father   . Anxiety disorder Brother   . Depression Brother     Social History:   Social History   Social History  . Marital status: Married    Spouse name: N/A  . Number of children: N/A  . Years of education: N/A   Social History Main Topics  . Smoking status: Former Smoker   Packs/day: 0.50    Types: Cigarettes    Quit date: 02/06/2016  . Smokeless tobacco: Never Used  . Alcohol use 3.6 oz/week    6 Cans of beer per week     Comment: on occasion  . Drug use: No  . Sexual activity: Yes    Birth control/ protection: None   Other Topics Concern  . None   Social History Narrative  . None    Additional Social History: Lives with his wife and 2 children.  Allergies:  No Known Allergies  Metabolic Disorder Labs: No results found for: HGBA1C, MPG No results found for: PROLACTIN Lab Results  Component Value Date   CHOL 151 08/06/2016   TRIG 66 08/06/2016   HDL 40 02/13/2016   CHOLHDL 2.7 02/13/2016   VLDL 13 08/06/2016   LDLCALC 58 02/13/2016   LDLCALC 77 12/01/2014     Current Medications: Current Outpatient Prescriptions  Medication Sig Dispense Refill  . amLODipine (NORVASC) 5 MG tablet Take 1 tablet (5 mg total) by mouth daily. 90 tablet 3  . amoxicillin-clavulanate (AUGMENTIN) 875-125 MG tablet Take 1 tablet by mouth 2 (two) times daily. 20 tablet 0  . ARIPiprazole (ABILIFY) 5 MG tablet Take 1 tablet (5 mg total) by mouth daily. 90 tablet 1  . atorvastatin (LIPITOR) 40 MG tablet TAKE 1 TABLET DAILY 90 tablet 0  . buPROPion (WELLBUTRIN XL) 150 MG 24 hr  tablet Take 1 tablet (150 mg total) by mouth every morning. 30 tablet 1  . glucose blood (BAYER CONTOUR TEST) test strip Use 3 (three) times daily. Use as instructed. TID Contour next E10.65    . insulin lispro (HUMALOG) 100 UNIT/ML injection Use as directed in insulin pump. MDD: 90 units. E10.65    . Insulin Pen Needle 29G X 12.7MM MISC 1 each by Does not apply route 3 (three) times daily. 100 each 12  . ondansetron (ZOFRAN ODT) 4 MG disintegrating tablet Take 1 tablet (4 mg total) by mouth every 8 (eight) hours as needed for nausea or vomiting. 30 tablet 0  . pantoprazole (PROTONIX) 40 MG tablet Take 40 mg by mouth 2 (two) times daily.     . ranitidine (ZANTAC) 150 MG tablet Take 1 tablet (150  mg total) by mouth at bedtime. 90 tablet 3   No current facility-administered medications for this visit.     Neurologic: Headache: No Seizure: No Paresthesias:No  Musculoskeletal: Strength & Muscle Tone: within normal limits Gait & Station: normal Patient leans: N/A  Psychiatric Specialty Exam: ROS  Blood pressure (!) 145/91, pulse (!) 103, temperature 98.6 F (37 C), temperature source Oral, weight 211 lb (95.7 kg).Body mass index is 30.28 kg/m.  General Appearance: Casual  Eye Contact:  Fair  Speech:  Clear and Coherent  Volume:  Normal  Mood:  Improved mood  Affect:  Congruent  Thought Process:  Coherent  Orientation:  Full (Time, Place, and Person)  Thought Content:  Logical  Suicidal Thoughts:  No  Homicidal Thoughts:  No  Memory:  Immediate;   Fair Recent;   Fair Remote;   Fair  Judgement:  Fair  Insight:  Fair  Psychomotor Activity:  Normal  Concentration:  Concentration: Fair and Attention Span: Fair  Recall:  [employer] of Knowledge:Fair  Language: Fair  Akathisia:  No  Handed:  Right  AIMS (if indicated):    Assets:  Communication Skills Desire for Improvement Financial Resources/Insurance Housing Physical Health Social Support Vocational/Educational  ADL's:  Intact  Cognition: WNL  Sleep:  improved    Treatment Plan Summary:  Major depressive disorder moderate Increase Wellbutrin to 153m daily in the morning.  Continue Abilify at 2.5 mg once daily for another 2 weeks and try to take it on alternate days. Patient to start seeing a therapist and he was given a list of therapists.  Generalized anxiety disorder Same as above  Return to clinic in 4 weeks time or call before if needed     RElvin So MD 8/14/20189:35 AM

## 2016-10-15 ENCOUNTER — Other Ambulatory Visit: Payer: Self-pay | Admitting: Psychiatry

## 2016-10-22 ENCOUNTER — Ambulatory Visit (INDEPENDENT_AMBULATORY_CARE_PROVIDER_SITE_OTHER): Payer: 59 | Admitting: Psychiatry

## 2016-10-22 ENCOUNTER — Encounter: Payer: Self-pay | Admitting: Psychiatry

## 2016-10-22 VITALS — BP 119/78 | HR 98 | Temp 98.7°F | Wt 210.6 lb

## 2016-10-22 DIAGNOSIS — F331 Major depressive disorder, recurrent, moderate: Secondary | ICD-10-CM

## 2016-10-22 DIAGNOSIS — E1065 Type 1 diabetes mellitus with hyperglycemia: Secondary | ICD-10-CM | POA: Diagnosis not present

## 2016-10-22 DIAGNOSIS — F411 Generalized anxiety disorder: Secondary | ICD-10-CM

## 2016-10-22 MED ORDER — BUPROPION HCL ER (XL) 150 MG PO TB24: 150.0000 mg | ORAL_TABLET | ORAL | 1 refills | Status: DC

## 2016-10-22 MED ORDER — ARIPIPRAZOLE 2 MG PO TABS: 2.0000 mg | ORAL_TABLET | Freq: Every day | ORAL | 1 refills | Status: DC

## 2016-10-22 NOTE — Progress Notes (Signed)
Psychiatric progress note  Patient Identification: Bernard [name] MRN:  [id number] Date of Evaluation:  10/22/2016 Referral Source: Dr.Crissman Chief Complaint:  Doing much better Chief Complaint    Follow-up; Medication Refill     Visit Diagnosis:    ICD-10-CM   1. MDD (major depressive disorder), recurrent episode, moderate (HCC) F33.1   2. GAD (generalized anxiety disorder) F41.1     History of Present Illness:  Patient is a 41 year old Caucasian male here for a follow up for depression. Patient reports that he is tolerating the increase in the Wellbutrin to 150 mg quite well. States that he has not felt the need to take a nap in a month. His energy levels are much better. Reports that on the trying to take the Abilify on alternate days he has not been successful and it has caused increased irritability. He has gone back to taking the Abilify at 2.5 mg daily. We discussed that we will continue him on the Abilify starting at 2 mg daily. He is agreeable to that. Denies any suicidal thoughts. Fair sleep and appetite. Discussed his diabetes and his hemoglobin A1c is at 7.2 and he has an appointment with his endocrinologist today. States that he has a new insulin pump and thinks his levels will be better.   Past Psychiatric History: No hospitalizations.  Previous Psychotropic Medications: Yes   Substance Abuse History in the last 12 months:  No.  Consequences of Substance Abuse: Negative  Past Medical History:  Past Medical History:  Diagnosis Date  . Anxiety   . Depression   . Esophageal stricture   . Hyperlipidemia   . Hypertension     Past Surgical History:  Procedure Laterality Date  . PILONIDAL CYST EXCISION    . WISDOM TOOTH EXTRACTION      Family Psychiatric History: see below  Family History:  Family History  Problem Relation Age of Onset  . Cancer Mother   . Anxiety disorder Mother   . Depression Mother   . Cancer Father   . Anxiety disorder Brother    . Depression Brother     Social History:   Social History   Social History  . Marital status: Married    Spouse name: N/A  . Number of children: N/A  . Years of education: N/A   Social History Main Topics  . Smoking status: Former Smoker    Packs/day: 0.50    Types: Cigarettes    Quit date: 02/06/2016  . Smokeless tobacco: Never Used  . Alcohol use 3.6 oz/week    6 Cans of beer per week     Comment: on occasion  . Drug use: No  . Sexual activity: Yes    Birth control/ protection: None   Other Topics Concern  . None   Social History Narrative  . None    Additional Social History: Lives with his wife and 2 children.  Allergies:  No Known Allergies  Metabolic Disorder Labs: No results found for: HGBA1C, MPG No results found for: PROLACTIN Lab Results  Component Value Date   CHOL 151 08/06/2016   TRIG 66 08/06/2016   HDL 40 02/13/2016   CHOLHDL 2.7 02/13/2016   VLDL 13 08/06/2016   LDLCALC 58 02/13/2016   LDLCALC 77 12/01/2014     Current Medications: Current Outpatient Prescriptions  Medication Sig Dispense Refill  . amLODipine (NORVASC) 5 MG tablet Take 1 tablet (5 mg total) by mouth daily. 90 tablet 3  . amoxicillin-clavulanate (AUGMENTIN) 875-125 MG tablet Take  1 tablet by mouth 2 (two) times daily. 20 tablet 0  . ARIPiprazole (ABILIFY) 2 MG tablet Take 1 tablet (2 mg total) by mouth daily. 30 tablet 1  . atorvastatin (LIPITOR) 40 MG tablet TAKE 1 TABLET DAILY 90 tablet 0  . buPROPion (WELLBUTRIN XL) 150 MG 24 hr tablet Take 1 tablet (150 mg total) by mouth every morning. 30 tablet 1  . glucose blood (BAYER CONTOUR TEST) test strip Use 3 (three) times daily. Use as instructed. TID Contour next E10.65    . insulin lispro (HUMALOG) 100 UNIT/ML injection Use as directed in insulin pump. MDD: 90 units. E10.65    . Insulin Pen Needle 29G X 12.7MM MISC 1 each by Does not apply route 3 (three) times daily. 100 each 12  . ondansetron (ZOFRAN ODT) 4 MG  disintegrating tablet Take 1 tablet (4 mg total) by mouth every 8 (eight) hours as needed for nausea or vomiting. 30 tablet 0  . pantoprazole (PROTONIX) 40 MG tablet Take 40 mg by mouth 2 (two) times daily.     . ranitidine (ZANTAC) 150 MG tablet Take 1 tablet (150 mg total) by mouth at bedtime. 90 tablet 3   No current facility-administered medications for this visit.     Neurologic: Headache: No Seizure: No Paresthesias:No  Musculoskeletal: Strength & Muscle Tone: within normal limits Gait & Station: normal Patient leans: N/A  Psychiatric Specialty Exam: ROS  Blood pressure 119/78, pulse 98, temperature 98.7 F (37.1 C), temperature source Oral, weight 210 lb 9.6 oz (95.5 kg).Body mass index is 30.22 kg/m.  General Appearance: Casual  Eye Contact:  Fair  Speech:  Clear and Coherent  Volume:  Normal  Mood:  Improved mood  Affect:  Congruent  Thought Process:  Coherent  Orientation:  Full (Time, Place, and Person)  Thought Content:  Logical  Suicidal Thoughts:  No  Homicidal Thoughts:  No  Memory:  Immediate;   Fair Recent;   Fair Remote;   Fair  Judgement:  Fair  Insight:  Fair  Psychomotor Activity:  Normal  Concentration:  Concentration: Fair and Attention Span: Fair  Recall:  [employer] of Knowledge:Fair  Language: Fair  Akathisia:  No  Handed:  Right  AIMS (if indicated):    Assets:  Communication Skills Desire for Improvement Financial Resources/Insurance Housing Physical Health Social Support Vocational/Educational  ADL's:  Intact  Cognition: WNL  Sleep:  improved    Treatment Plan Summary:  Major depressive disorder moderate Continue Wellbutrin at 152m daily in the morning.  Change Abilify to 2 mg to take once daily Patient to start seeing a therapist and he was given a list of therapists.  Generalized anxiety disorder Same as above  Return to clinic in 2 months time or call before if needed     HElvin So MD 9/17/20189:56 AM

## 2016-10-29 DIAGNOSIS — E109 Type 1 diabetes mellitus without complications: Secondary | ICD-10-CM | POA: Diagnosis not present

## 2016-10-29 DIAGNOSIS — E785 Hyperlipidemia, unspecified: Secondary | ICD-10-CM

## 2016-10-29 DIAGNOSIS — E1069 Type 1 diabetes mellitus with other specified complication: Secondary | ICD-10-CM | POA: Insufficient documentation

## 2016-10-29 DIAGNOSIS — E1159 Type 2 diabetes mellitus with other circulatory complications: Secondary | ICD-10-CM | POA: Diagnosis not present

## 2016-11-19 DIAGNOSIS — E109 Type 1 diabetes mellitus without complications: Secondary | ICD-10-CM | POA: Diagnosis not present

## 2016-11-21 ENCOUNTER — Ambulatory Visit: Payer: Self-pay | Admitting: Psychiatry

## 2016-12-03 ENCOUNTER — Other Ambulatory Visit: Payer: Self-pay | Admitting: Family Medicine

## 2016-12-03 DIAGNOSIS — E7800 Pure hypercholesterolemia, unspecified: Secondary | ICD-10-CM

## 2016-12-03 DIAGNOSIS — I10 Essential (primary) hypertension: Secondary | ICD-10-CM

## 2016-12-03 MED ORDER — AMLODIPINE BESYLATE 5 MG PO TABS: 5.0000 mg | ORAL_TABLET | Freq: Every day | ORAL | 3 refills | Status: DC

## 2016-12-03 MED ORDER — ATORVASTATIN CALCIUM 40 MG PO TABS: 40.0000 mg | ORAL_TABLET | Freq: Every day | ORAL | 3 refills | Status: DC

## 2016-12-03 MED ORDER — PANTOPRAZOLE SODIUM 40 MG PO TBEC: 40.0000 mg | DELAYED_RELEASE_TABLET | Freq: Two times a day (BID) | ORAL | 3 refills | Status: DC

## 2016-12-03 MED ORDER — RANITIDINE HCL 150 MG PO TABS: 150.0000 mg | ORAL_TABLET | Freq: Every day | ORAL | 3 refills | Status: DC

## 2016-12-03 NOTE — Telephone Encounter (Signed)
Copied from Leon #[id number]. Topic: Quick Communication - See Telephone Encounter >> Dec 03, 2016  9:32 AM Ether Griffins B wrote: CRM for notification. See Telephone encounter for:  12/03/16. Pt would like to change pharmacy from mail order to local pharmacy and have those resent. Pt would also like to update insurance information and see about a refill on pantoprazole 110m.

## 2016-12-03 NOTE — Telephone Encounter (Signed)
Called and left patient a VM asking for him to please return my call. Need to know which medications the patient needs sent to mail order and to which mail order pharmacy because there are multiple listed in the chart. OK for [facility] to obtain information from the patient if he returns the call.

## 2016-12-03 NOTE — Telephone Encounter (Signed)
Copied from [place] #[email or web address]. Topic: Quick Communication - See Telephone Encounter >> Dec 03, 2016  9:32 AM Ether Griffins B wrote: CRM for notification. See Telephone encounter for:  12/03/16. Pt would like to change pharmacy from mail order to local pharmacy and have those resent. Pt would also like to update insurance information and see about a refill on pantoprazole 24m.  >> Dec 03, 2016  4:43 PM BOliver PilaB wrote: [facility] the [facility] [place] Atorvastatin Ramitidine  He was unsure of a certain med that he was needing thru a referral from Dr. CJeananne Ramabut has questions about Pantoprazole for his acid reflux   Called and spoke to patient. He wants to know if his prescription for pantoprazole 40, 1 tablet BID can be sent in as well since his gastro provider wrote this.

## 2016-12-17 ENCOUNTER — Other Ambulatory Visit: Payer: Self-pay | Admitting: Psychiatry

## 2016-12-24 ENCOUNTER — Other Ambulatory Visit (HOSPITAL_COMMUNITY): Payer: Self-pay | Admitting: Psychiatry

## 2016-12-24 MED ORDER — BUPROPION HCL ER (XL) 150 MG PO TB24: 150.0000 mg | ORAL_TABLET | ORAL | 1 refills | Status: DC

## 2017-01-01 ENCOUNTER — Other Ambulatory Visit: Payer: Self-pay | Admitting: Psychiatry

## 2017-01-04 ENCOUNTER — Ambulatory Visit
Admission: EM | Admit: 2017-01-04 | Discharge: 2017-01-04 | Disposition: A | Discharge status: Home / Self Care | Payer: 59 | Attending: Family Medicine | Admitting: Family Medicine

## 2017-01-04 ENCOUNTER — Other Ambulatory Visit: Payer: Self-pay

## 2017-01-04 DIAGNOSIS — R197 Diarrhea, unspecified: Secondary | ICD-10-CM | POA: Diagnosis not present

## 2017-01-04 DIAGNOSIS — J029 Acute pharyngitis, unspecified: Secondary | ICD-10-CM | POA: Diagnosis not present

## 2017-01-04 DIAGNOSIS — R05 Cough: Secondary | ICD-10-CM

## 2017-01-04 LAB — RAPID STREP SCREEN (MED CTR MEBANE ONLY): STREPTOCOCCUS, GROUP A SCREEN (DIRECT): NEGATIVE

## 2017-01-04 MED ORDER — AMOXICILLIN 500 MG PO CAPS: 500.0000 mg | ORAL_CAPSULE | Freq: Two times a day (BID) | ORAL | 0 refills | Status: DC

## 2017-01-04 NOTE — ED Provider Notes (Signed)
[facility]    CSN: [id number] Arrival date & time: 01/04/17  0806  History   Chief Complaint Chief Complaint  Patient presents with  . Sore Throat   HPI  41 year old male presents with sore throat, diarrhea, cough.  Patient reports that he has been sick since Saturday.  His daughter is also sick.  He has had fever, diarrhea, sore throat, cough.  Sore throat is his primary complaint at this time.  Moderate in severity, 5/10.  No known exacerbating relieving factors.  No reports of shortness of breath.  No other associated symptoms.  No other complaints or concerns at this time.  Past Medical History:  Diagnosis Date  . Anxiety   . Depression   . Esophageal stricture   . Hyperlipidemia   . Hypertension    Patient Active Problem List   Diagnosis Date Noted  . Acute sinusitis 08/06/2016  . Acid reflux 06/04/2016  . DM type 1 (diabetes mellitus, type 1) ([place]) 12/01/2014  . Depression   . Hyperlipidemia   . Hypertension    Past Surgical History:  Procedure Laterality Date  . PILONIDAL CYST EXCISION    . WISDOM TOOTH EXTRACTION     Home Medications    Prior to Admission medications   Medication Sig Start Date End Date Taking? Authorizing Provider  amLODipine (NORVASC) 5 MG tablet Take 1 tablet (5 mg total) by mouth daily. 12/03/16  Yes Crissman, Jeannette How, MD  ARIPiprazole (ABILIFY) 2 MG tablet Take 1 tablet (2 mg total) by mouth daily. 10/22/16  Yes Ravi, Himabindu, MD  atorvastatin (LIPITOR) 40 MG tablet Take 1 tablet (40 mg total) by mouth daily. 12/03/16  Yes Guadalupe Maple, MD  buPROPion (WELLBUTRIN XL) 150 MG 24 hr tablet Take 1 tablet (150 mg total) every morning by mouth. 12/24/16 12/24/17 Yes Ravi, Himabindu, MD  insulin lispro (HUMALOG) 100 UNIT/ML injection Use as directed in insulin pump. MDD: 90 units. E10.65 01/12/16  Yes [provider]  Insulin Pen Needle 29G X 12.7MM MISC 1 each by Does not apply route 3 (three) times daily. 08/12/14  Yes  Johnson, Megan P, DO  ondansetron (ZOFRAN ODT) 4 MG disintegrating tablet Take 1 tablet (4 mg total) by mouth every 8 (eight) hours as needed for nausea or vomiting. 02/17/16  Yes Johnson, Megan P, DO  pantoprazole (PROTONIX) 40 MG tablet Take 1 tablet (40 mg total) by mouth 2 (two) times daily. 12/03/16  Yes Crissman, Jeannette How, MD  ranitidine (ZANTAC) 150 MG tablet Take 1 tablet (150 mg total) by mouth at bedtime. 12/03/16  Yes Crissman, Jeannette How, MD  amoxicillin (AMOXIL) 500 MG capsule Take 1 capsule (500 mg total) by mouth 2 (two) times daily. 01/04/17   Coral Spikes, DO    Family History Family History  Problem Relation Age of Onset  . Cancer Mother   . Anxiety disorder Mother   . Depression Mother   . Cancer Father   . Anxiety disorder Brother   . Depression Brother     Social History Social History   Tobacco Use  . Smoking status: Former Smoker    Packs/day: 0.50    Types: Cigarettes    Last attempt to quit: 02/06/2016    Years since quitting: 0.9  . Smokeless tobacco: Never Used  Substance Use Topics  . Alcohol use: Yes    Alcohol/week: 3.6 oz    Types: 6 Cans of beer per week    Comment: on occasion  . Drug  use: No     Allergies   Patient has no known allergies.   Review of Systems Review of Systems  Constitutional: Positive for fever.  HENT: Positive for sore throat.   Respiratory: Positive for cough.   All other systems reviewed and are negative.  Physical Exam Triage Vital Signs ED Triage Vitals  Enc Vitals Group     BP 01/04/17 0840 123/79     Pulse Rate 01/04/17 0840 (!) 103     Resp 01/04/17 0840 18     Temp 01/04/17 0840 98.4 F (36.9 C)     Temp Source 01/04/17 0840 Oral     SpO2 01/04/17 0840 95 %     Weight 01/04/17 0837 210 lb (95.3 kg)     Height 01/04/17 0837 5' 10"  (1.778 m)     Head Circumference --      Peak Flow --      Pain Score 01/04/17 0837 5     Pain Loc --      Pain Edu? --      Excl. in [place]? --    Updated Vital Signs BP  123/79 (BP Location: Left Arm)   Pulse (!) 103   Temp 98.4 F (36.9 C) (Oral)   Resp 18   Ht 5' 10"  (1.778 m)   Wt 210 lb (95.3 kg)   SpO2 95%   BMI 30.13 kg/m   Physical Exam  Constitutional: He is oriented to person, place, and time. He appears well-developed and well-nourished. He does not appear ill.  HENT:  Head: Normocephalic and atraumatic.  Oropharynx with severe erythema.  Ulcerations noted on the left.  Eyes: Conjunctivae are normal. Right eye exhibits no discharge. Left eye exhibits no discharge.  Neck: Neck supple.  Cardiovascular: Regular rhythm.  Tachycardia.  No murmur.  Pulmonary/Chest: Effort normal and breath sounds normal. He has no wheezes. He has no rales.  Abdominal: Soft. He exhibits no distension.  Lymphadenopathy:    He has no cervical adenopathy.  Neurological: He is alert and oriented to person, place, and time. He exhibits normal muscle tone.  Skin: Skin is warm. No rash noted.  Psychiatric: He has a normal mood and affect. His behavior is normal.  Vitals reviewed.  UC Treatments / Results  Labs (all labs ordered are listed, but only abnormal results are displayed) Labs Reviewed  RAPID STREP SCREEN (NOT AT [facility])  CULTURE, GROUP A STREP [facility])    EKG  EKG Interpretation None       Radiology No results found.  Procedures Procedures (including critical care time)  Medications Ordered in UC Medications - No data to display   Initial Impression / Assessment and Plan / UC Course  I have reviewed the triage vital signs and the nursing notes.  Pertinent labs & imaging results that were available during my care of the patient were reviewed by me and considered in my medical decision making (see chart for details).     41 year male presents with fever and severe sore throat. Rapid strep negative. Given daughter has strep pharyngitis and his persistent symptoms, I will treat him strep pharyngitis with amoxicillin.  Final Clinical  Impressions(s) / UC Diagnoses   Final diagnoses:  Pharyngitis, unspecified etiology    ED Discharge Orders        Ordered    amoxicillin (AMOXIL) 500 MG capsule  2 times daily     01/04/17 0905     Controlled Substance Prescriptions [place] Controlled Substance Registry consulted?  Not Applicable   Coral Spikes, [place] 01/04/17 [id number]

## 2017-01-04 NOTE — ED Triage Notes (Signed)
Patient complains of sore throat, fever and cough that started on Saturday. Patient states that fever broke on Tuesday.

## 2017-01-05 LAB — CULTURE, GROUP A STREP (THRC)

## 2017-01-08 ENCOUNTER — Ambulatory Visit (INDEPENDENT_AMBULATORY_CARE_PROVIDER_SITE_OTHER): Payer: 59 | Admitting: Psychiatry

## 2017-01-08 ENCOUNTER — Other Ambulatory Visit: Payer: Self-pay

## 2017-01-08 ENCOUNTER — Encounter: Payer: Self-pay | Admitting: Psychiatry

## 2017-01-08 VITALS — BP 139/77 | HR 116 | Temp 98.7°F | Wt 207.6 lb

## 2017-01-08 DIAGNOSIS — F411 Generalized anxiety disorder: Secondary | ICD-10-CM | POA: Diagnosis not present

## 2017-01-08 DIAGNOSIS — F331 Major depressive disorder, recurrent, moderate: Secondary | ICD-10-CM

## 2017-01-08 MED ORDER — BUPROPION HCL ER (XL) 150 MG PO TB24
150.0000 mg | ORAL_TABLET | ORAL | 2 refills | Status: DC
Start: 2017-01-08 — End: 2017-03-13

## 2017-01-08 MED ORDER — ARIPIPRAZOLE 2 MG PO TABS: 2.0000 mg | ORAL_TABLET | Freq: Every day | ORAL | 2 refills | Status: DC

## 2017-01-08 NOTE — Progress Notes (Signed)
Psychiatric progress note  Patient Identification: Bernard [name] MRN:  [id number] Date of Evaluation:  01/08/2017 Referral Source: Dr.Crissman Chief Complaint:  Doing well Chief Complaint    Follow-up; Medication Refill     Visit Diagnosis:    ICD-10-CM   1. MDD (major depressive disorder), recurrent episode, moderate (HCC) F33.1   2. GAD (generalized anxiety disorder) F41.1     History of Present Illness:  Patient is a 41 year old Caucasian male here for a follow up for depression. Reports doing quite well. Compliant with his medications and states he has no side effects. 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 They're looking forward to moving to a larger house down the street. States that he has good energy" what's very different. States that his diabetes is mostly under control though Thanksgiving was a difficult time to keep his some sugar levels down. Overall doing well. Good sleep and appetite.   Past Psychiatric History: No hospitalizations.  Previous Psychotropic Medications: Yes   Substance Abuse History in the last 12 months:  No.  Consequences of Substance Abuse: Negative  Past Medical History:  Past Medical History:  Diagnosis Date  . Anxiety   . Depression   . Esophageal stricture   . Hyperlipidemia   . Hypertension     Past Surgical History:  Procedure Laterality Date  . PILONIDAL CYST EXCISION    . WISDOM TOOTH EXTRACTION      Family Psychiatric History: see below  Family History:  Family History  Problem Relation Age of Onset  . Cancer Mother   . Anxiety disorder Mother   . Depression Mother   . Cancer Father   . Anxiety disorder Brother   . Depression Brother     Social History:   Social History   Socioeconomic History  . Marital status: Married    Spouse name: joan   . Number of children: 2  . Years of education: None  . Highest education level:  Bachelor's degree (e.g., BA, AB, BS)  Social Needs  . Financial resource strain: Not hard at all  . Food insecurity - worry: Never true  . Food insecurity - inability: Never true  . Transportation needs - medical: No  . Transportation needs - non-medical: No  Occupational History  . None  Tobacco Use  . Smoking status: Former Smoker    Packs/day: 0.50    Types: Cigarettes    Last attempt to quit: 02/06/2016    Years since quitting: 0.9  . Smokeless tobacco: Never Used  Substance and Sexual Activity  . Alcohol use: Yes    Alcohol/week: 3.6 oz    Types: 6 Cans of beer per week    Comment: on occasion  . Drug use: No  . Sexual activity: Yes    Birth control/protection: None  Other Topics Concern  . None  Social History Narrative  . None    Additional Social History: Lives with his wife and 2 children.  Allergies:  No Known Allergies  Metabolic Disorder Labs: No results found for: HGBA1C, MPG No results found for: PROLACTIN Lab Results  Component Value Date   CHOL 151 08/06/2016   TRIG 66 08/06/2016   HDL 40 02/13/2016   CHOLHDL 2.7 02/13/2016   VLDL 13 08/06/2016   LDLCALC 58 02/13/2016   LDLCALC 77 12/01/2014     Current Medications: Current Outpatient Medications  Medication Sig Dispense Refill  . amLODipine (NORVASC) 5 MG tablet Take  1 tablet (5 mg total) by mouth daily. 30 tablet 3  . amoxicillin (AMOXIL) 500 MG capsule Take 1 capsule (500 mg total) by mouth 2 (two) times daily. 20 capsule 0  . ARIPiprazole (ABILIFY) 2 MG tablet Take 1 tablet (2 mg total) by mouth daily. 30 tablet 1  . atorvastatin (LIPITOR) 40 MG tablet Take 1 tablet (40 mg total) by mouth daily. 30 tablet 3  . buPROPion (WELLBUTRIN XL) 150 MG 24 hr tablet Take 1 tablet (150 mg total) every morning by mouth. 30 tablet 1  . insulin lispro (HUMALOG) 100 UNIT/ML injection Use as directed in insulin pump. MDD: 90 units. E10.65    . Insulin Pen Needle 29G X 12.7MM MISC 1 each by Does not apply  route 3 (three) times daily. 100 each 12  . ondansetron (ZOFRAN ODT) 4 MG disintegrating tablet Take 1 tablet (4 mg total) by mouth every 8 (eight) hours as needed for nausea or vomiting. 30 tablet 0  . pantoprazole (PROTONIX) 40 MG tablet Take 1 tablet (40 mg total) by mouth 2 (two) times daily. 60 tablet 3  . ranitidine (ZANTAC) 150 MG tablet Take 1 tablet (150 mg total) by mouth at bedtime. 30 tablet 3   No current facility-administered medications for this visit.     Neurologic: Headache: No Seizure: No Paresthesias:No  Musculoskeletal: Strength & Muscle Tone: within normal limits Gait & Station: normal Patient leans: N/A  Psychiatric Specialty Exam: ROS  Blood pressure 139/77, pulse (!) 116, temperature 98.7 F (37.1 C), temperature source Oral, weight 94.2 kg (207 lb 9.6 oz).Body mass index is 29.79 kg/m.  General Appearance: Casual  Eye Contact:  Fair  Speech:  Clear and Coherent  Volume:  Normal  Mood: good  Affect:  Congruent  Thought Process:  Coherent  Orientation:  Full (Time, Place, and Person)  Thought Content:  Logical  Suicidal Thoughts:  No  Homicidal Thoughts:  No  Memory:  Immediate;   Fair Recent;   Fair Remote;   Fair  Judgement:  Fair  Insight:  Fair  Psychomotor Activity:  Normal  Concentration:  Concentration: Fair and Attention Span: Fair  Recall:  [employer] of Knowledge:Fair  Language: Fair  Akathisia:  No  Handed:  Right  AIMS (if indicated):    Assets:  Communication Skills Desire for Improvement Financial Resources/Insurance Housing Physical Health Social Support Vocational/Educational  ADL's:  Intact  Cognition: WNL  Sleep:  improved    Treatment Plan Summary:  Major depressive disorder moderate Continue Wellbutrin at 125m daily in the morning.  Continue Abilify to 2 mg to take once daily Patient will consider therapist after the first of the year when his insurance changes.  Generalized anxiety disorder Same as  above  Return to clinic in 3 months time or call before if needed     HElvin So MD 12/4/201810:42 AM

## 2017-02-18 ENCOUNTER — Encounter: Payer: Self-pay | Admitting: Family Medicine

## 2017-02-26 ENCOUNTER — Encounter: Payer: Self-pay | Admitting: Family Medicine

## 2017-03-07 ENCOUNTER — Encounter: Payer: Self-pay | Admitting: Family Medicine

## 2017-03-07 ENCOUNTER — Ambulatory Visit: Payer: 59 | Admitting: Family Medicine

## 2017-03-07 VITALS — BP 119/72 | HR 103 | Ht 69.2 in | Wt 206.1 lb

## 2017-03-07 DIAGNOSIS — E7800 Pure hypercholesterolemia, unspecified: Secondary | ICD-10-CM

## 2017-03-07 DIAGNOSIS — Z114 Encounter for screening for human immunodeficiency virus [HIV]: Secondary | ICD-10-CM | POA: Diagnosis not present

## 2017-03-07 DIAGNOSIS — E1069 Type 1 diabetes mellitus with other specified complication: Secondary | ICD-10-CM

## 2017-03-07 DIAGNOSIS — K219 Gastro-esophageal reflux disease without esophagitis: Secondary | ICD-10-CM

## 2017-03-07 DIAGNOSIS — Z125 Encounter for screening for malignant neoplasm of prostate: Secondary | ICD-10-CM

## 2017-03-07 DIAGNOSIS — Z0001 Encounter for general adult medical examination with abnormal findings: Secondary | ICD-10-CM | POA: Diagnosis not present

## 2017-03-07 DIAGNOSIS — E785 Hyperlipidemia, unspecified: Secondary | ICD-10-CM | POA: Diagnosis not present

## 2017-03-07 DIAGNOSIS — E109 Type 1 diabetes mellitus without complications: Secondary | ICD-10-CM

## 2017-03-07 DIAGNOSIS — I10 Essential (primary) hypertension: Secondary | ICD-10-CM

## 2017-03-07 DIAGNOSIS — F331 Major depressive disorder, recurrent, moderate: Secondary | ICD-10-CM

## 2017-03-07 DIAGNOSIS — Z1329 Encounter for screening for other suspected endocrine disorder: Secondary | ICD-10-CM

## 2017-03-07 LAB — MICROALBUMIN, URINE WAIVED
CREATININE, URINE WAIVED: 50 mg/dL (ref 10–300)
MICROALB, UR WAIVED: 10 mg/L (ref 0–19)
Microalb/Creat Ratio: <30 mg/g (ref <30)

## 2017-03-07 LAB — URINALYSIS, ROUTINE W REFLEX MICROSCOPIC
Bilirubin, UA: NEGATIVE
GLUCOSE, UA: NEGATIVE
Ketones, UA: NEGATIVE
Leukocytes, UA: NEGATIVE
NITRITE UA: NEGATIVE
Protein, UA: NEGATIVE
SPEC GRAV UA: 1.01 (ref 1.005–1.030)
UUROB: 0.2 mg/dL (ref 0.2–1.0)
pH, UA: 6.5 (ref 5.0–7.5)

## 2017-03-07 LAB — MICROSCOPIC EXAMINATION: Bacteria, UA: NONE SEEN

## 2017-03-07 MED ORDER — AMLODIPINE BESYLATE 5 MG PO TABS: 5.0000 mg | ORAL_TABLET | Freq: Every day | ORAL | 12 refills | Status: DC

## 2017-03-07 MED ORDER — PANTOPRAZOLE SODIUM 40 MG PO TBEC: 40.0000 mg | DELAYED_RELEASE_TABLET | Freq: Two times a day (BID) | ORAL | 12 refills | Status: DC

## 2017-03-07 MED ORDER — ATORVASTATIN CALCIUM 40 MG PO TABS: 40.0000 mg | ORAL_TABLET | Freq: Every day | ORAL | 12 refills | Status: DC

## 2017-03-07 MED ORDER — RANITIDINE HCL 150 MG PO TABS: 150.0000 mg | ORAL_TABLET | Freq: Every day | ORAL | 12 refills | Status: DC

## 2017-03-07 MED ORDER — ONDANSETRON 4 MG PO TBDP: 4.0000 mg | ORAL_TABLET | Freq: Three times a day (TID) | ORAL | 1 refills | Status: DC | PRN

## 2017-03-07 NOTE — Assessment & Plan Note (Signed)
The current medical regimen is effective;  continue present plan and medications.  

## 2017-03-07 NOTE — Progress Notes (Signed)
BP 119/72   Pulse (!) 103   Ht 5' 9.2" (1.758 m)   Wt 206 lb 1 oz (93.5 kg)   SpO2 97%   BMI 30.25 kg/m    Subjective:    Patient ID: Bernard [name], male    DOB: 1975-03-05, 42 y.o.   MRN: [id number]  HPI: Bernard [name] is a 42 y.o. male  Chief Complaint  Patient presents with  . Annual Exam   All in all doing very well diabetes followed by endocrinology with insulin pump and really has done well with good control.  Has had some low blood sugar spells. Patient also doing well with other medications blood pressure good control seeing psychiatry for depression nerves which is under good control. Reflux doing well on dual therapy with good control. Relevant past medical, surgical, family and social history reviewed and updated as indicated. Interim medical history since our last visit reviewed. Allergies and medications reviewed and updated.  Review of Systems  Constitutional: Negative.   HENT: Negative.   Eyes: Negative.   Respiratory: Negative.   Cardiovascular: Negative.   Gastrointestinal: Negative.   Endocrine: Negative.   Genitourinary: Negative.   Musculoskeletal: Negative.   Skin: Negative.   Allergic/Immunologic: Negative.   Neurological: Negative.   Hematological: Negative.   Psychiatric/Behavioral: Negative.     Per HPI unless specifically indicated above     Objective:    BP 119/72   Pulse (!) 103   Ht 5' 9.2" (1.758 m)   Wt 206 lb 1 oz (93.5 kg)   SpO2 97%   BMI 30.25 kg/m   Wt Readings from Last 3 Encounters:  03/07/17 206 lb 1 oz (93.5 kg)  01/04/17 210 lb (95.3 kg)  08/06/16 213 lb (96.6 kg)    Physical Exam  Constitutional: He is oriented to person, place, and time. He appears well-developed and well-nourished.  HENT:  Head: Normocephalic and atraumatic.  Right Ear: External ear normal.  Left Ear: External ear normal.  Eyes: Conjunctivae and EOM are normal. Pupils are equal, round, and reactive to light.  Neck: Normal  range of motion. Neck supple.  Cardiovascular: Normal rate, regular rhythm, normal heart sounds and intact distal pulses.  Pulmonary/Chest: Effort normal and breath sounds normal.  Abdominal: Soft. Bowel sounds are normal. There is no splenomegaly or hepatomegaly.  Genitourinary: Rectum normal, prostate normal and penis normal.  Musculoskeletal: Normal range of motion.  Neurological: He is alert and oriented to person, place, and time. He has normal reflexes.  Skin: No rash noted. No erythema.  Psychiatric: He has a normal mood and affect. His behavior is normal. Judgment and thought content normal.    Results for orders placed or performed during the hospital encounter of 01/04/17  Rapid strep screen  Result Value Ref Range   Streptococcus, Group A Screen (Direct) NEGATIVE NEGATIVE  Culture, group A strep  Result Value Ref Range   Specimen Description THROAT    Special Requests NONE Reflexed from [phone]    Culture FEW GROUP A STREP (S.PYOGENES) ISOLATED    Report Status 01/05/2017 FINAL       Assessment & Plan:   Problem List Items Addressed This Visit      Cardiovascular and Mediastinum   Hypertension - Primary    The current medical regimen is effective;  continue present plan and medications.       Relevant Medications   amLODipine (NORVASC) 5 MG tablet   atorvastatin (LIPITOR) 40 MG tablet   Other  Relevant Orders   CBC with Differential/Platelet   Comprehensive metabolic panel   Urinalysis, Routine w reflex microscopic     Digestive   Acid reflux    The current medical regimen is effective;  continue present plan and medications.       Relevant Medications   ondansetron (ZOFRAN ODT) 4 MG disintegrating tablet   ranitidine (ZANTAC) 150 MG tablet   pantoprazole (PROTONIX) 40 MG tablet     Endocrine   DM type 1 (diabetes mellitus, type 1) ([place])    By endocrinology and last A1c was 6.7 and doing well.      Relevant Medications   atorvastatin (LIPITOR) 40 MG  tablet   Other Relevant Orders   CBC with Differential/Platelet   Comprehensive metabolic panel   Urinalysis, Routine w reflex microscopic   Microalbumin, Urine Waived   Hyperlipidemia due to type 1 diabetes mellitus (HCC)   Relevant Medications   amLODipine (NORVASC) 5 MG tablet   atorvastatin (LIPITOR) 40 MG tablet   Other Relevant Orders   Lipid panel   Microalbumin, Urine Waived     Other   Depression    Followed by psychiatry and doing well.      Hyperlipidemia    The current medical regimen is effective;  continue present plan and medications.       Relevant Medications   amLODipine (NORVASC) 5 MG tablet   atorvastatin (LIPITOR) 40 MG tablet   Other Relevant Orders   CBC with Differential/Platelet   Comprehensive metabolic panel   Lipid panel   Urinalysis, Routine w reflex microscopic   Microalbumin, Urine Waived    Other Visit Diagnoses    Thyroid disorder screen       Relevant Orders   PSA   Prostate cancer screening       Relevant Orders   TSH   Encounter for screening for HIV       Relevant Orders   HIV antibody (with reflex)       Follow up plan: Return in about 6 months (around 09/04/2017) for BMP,  Lipids, ALT, AST.

## 2017-03-07 NOTE — Assessment & Plan Note (Signed)
By endocrinology and last A1c was 6.7 and doing well.

## 2017-03-07 NOTE — Assessment & Plan Note (Signed)
Followed by psychiatry and doing well.

## 2017-03-08 DIAGNOSIS — E1159 Type 2 diabetes mellitus with other circulatory complications: Secondary | ICD-10-CM | POA: Diagnosis not present

## 2017-03-08 DIAGNOSIS — E109 Type 1 diabetes mellitus without complications: Secondary | ICD-10-CM | POA: Diagnosis not present

## 2017-03-08 DIAGNOSIS — E1069 Type 1 diabetes mellitus with other specified complication: Secondary | ICD-10-CM | POA: Diagnosis not present

## 2017-03-08 LAB — CBC WITH DIFFERENTIAL/PLATELET
BASOS: 1 %
Basophils Absolute: 0.1 10*3/uL (ref 0.0–0.2)
EOS (ABSOLUTE): 0.8 10*3/uL — AB (ref 0.0–0.4)
EOS: 9 %
HEMATOCRIT: 42 % (ref 37.5–51.0)
Hemoglobin: 14.6 g/dL (ref 13.0–17.7)
Immature Grans (Abs): 0 10*3/uL (ref 0.0–0.1)
Immature Granulocytes: 0 %
Lymphocytes Absolute: 2 10*3/uL (ref 0.7–3.1)
Lymphs: 23 %
MCH: 30.7 pg (ref 26.6–33.0)
MCHC: 34.8 g/dL (ref 31.5–35.7)
MCV: 88 fL (ref 79–97)
MONOS ABS: 0.5 10*3/uL (ref 0.1–0.9)
Monocytes: 6 %
Neutrophils Absolute: 5.3 10*3/uL (ref 1.4–7.0)
Neutrophils: 61 %
Platelets: 239 10*3/uL (ref 150–379)
RBC: 4.75 x10E6/uL (ref 4.14–5.80)
RDW: 13.4 % (ref 12.3–15.4)
WBC: 8.7 10*3/uL (ref 3.4–10.8)

## 2017-03-08 LAB — COMPREHENSIVE METABOLIC PANEL
A/G RATIO: 2.3 — AB (ref 1.2–2.2)
ALK PHOS: 65 IU/L (ref 39–117)
ALT: 27 IU/L (ref 0–44)
AST: 18 IU/L (ref 0–40)
Albumin: 4.4 g/dL (ref 3.5–5.5)
BUN/Creatinine Ratio: 10 (ref 9–20)
BUN: 8 mg/dL (ref 6–24)
Bilirubin Total: 0.4 mg/dL (ref 0.0–1.2)
CHLORIDE: 102 mmol/L (ref 96–106)
CO2: 23 mmol/L (ref 20–29)
Calcium: 8.9 mg/dL (ref 8.7–10.2)
Creatinine, Ser: 0.77 mg/dL (ref 0.76–1.27)
GFR calc Af Amer: 131 mL/min/{1.73_m2} (ref >59)
GFR, EST NON AFRICAN AMERICAN: 114 mL/min/{1.73_m2} (ref >59)
GLOBULIN, TOTAL: 1.9 g/dL (ref 1.5–4.5)
Glucose: 201 mg/dL — ABNORMAL HIGH (ref 65–99)
POTASSIUM: 4.1 mmol/L (ref 3.5–5.2)
SODIUM: 141 mmol/L (ref 134–144)
Total Protein: 6.3 g/dL (ref 6.0–8.5)

## 2017-03-08 LAB — LIPID PANEL
CHOL/HDL RATIO: 2.4 ratio (ref 0.0–5.0)
CHOLESTEROL TOTAL: 130 mg/dL (ref 100–199)
HDL: 55 mg/dL (ref >39)
LDL Calculated: 42 mg/dL (ref 0–99)
TRIGLYCERIDES: 167 mg/dL — AB (ref 0–149)
VLDL Cholesterol Cal: 33 mg/dL (ref 5–40)

## 2017-03-08 LAB — HIV ANTIBODY (ROUTINE TESTING W REFLEX): HIV SCREEN 4TH GENERATION: NONREACTIVE

## 2017-03-08 LAB — TSH: TSH: 2.24 u[IU]/mL (ref 0.450–4.500)

## 2017-03-08 LAB — PSA: Prostate Specific Ag, Serum: 0.3 ng/mL (ref 0.0–4.0)

## 2017-03-11 ENCOUNTER — Other Ambulatory Visit: Payer: Self-pay | Admitting: Psychiatry

## 2017-03-11 ENCOUNTER — Encounter: Payer: Self-pay | Admitting: Family Medicine

## 2017-03-12 ENCOUNTER — Ambulatory Visit: Payer: 59 | Admitting: Psychiatry

## 2017-03-13 ENCOUNTER — Encounter: Payer: Self-pay | Admitting: Psychiatry

## 2017-03-13 ENCOUNTER — Ambulatory Visit: Payer: 59 | Admitting: Psychiatry

## 2017-03-13 ENCOUNTER — Other Ambulatory Visit: Payer: Self-pay

## 2017-03-13 VITALS — BP 127/84 | HR 109 | Wt 206.2 lb

## 2017-03-13 DIAGNOSIS — F411 Generalized anxiety disorder: Secondary | ICD-10-CM

## 2017-03-13 DIAGNOSIS — F331 Major depressive disorder, recurrent, moderate: Secondary | ICD-10-CM | POA: Diagnosis not present

## 2017-03-13 MED ORDER — ARIPIPRAZOLE 2 MG PO TABS: 2.0000 mg | ORAL_TABLET | Freq: Every day | ORAL | 2 refills | Status: DC

## 2017-03-13 MED ORDER — BUPROPION HCL ER (XL) 150 MG PO TB24: 150.0000 mg | ORAL_TABLET | ORAL | 2 refills | Status: DC

## 2017-03-13 NOTE — Progress Notes (Signed)
Psychiatric progress note  Patient Identification: Bernard [name] MRN:  [id number] Date of Evaluation:  03/13/2017 Referral Source: Dr.Crissman Chief Complaint:  Doing well Chief Complaint    Follow-up; Medication Refill     Visit Diagnosis:    ICD-10-CM   1. MDD (major depressive disorder), recurrent episode, moderate (HCC) F33.1   2. GAD (generalized anxiety disorder) F41.1     History of Present Illness:  Patient is a 42 year old Caucasian male here for a follow up for depression. Reports doing quite well. Compliant with his medications and states he has no side effects. Reports they did move into the new house, had some issues, but dealing well. 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  Overall doing well. Good sleep and appetite.   Past Psychiatric History: No hospitalizations.  Previous Psychotropic Medications: Yes   Substance Abuse History in the last 12 months:  No.  Consequences of Substance Abuse: Negative  Past Medical History:  Past Medical History:  Diagnosis Date  . Anxiety   . Depression   . Esophageal stricture   . Hyperlipidemia   . Hypertension     Past Surgical History:  Procedure Laterality Date  . PILONIDAL CYST EXCISION    . WISDOM TOOTH EXTRACTION      Family Psychiatric History: see below  Family History:  Family History  Problem Relation Age of Onset  . Cancer Mother   . Anxiety disorder Mother   . Depression Mother   . Cancer Father   . Anxiety disorder Brother   . Depression Brother     Social History:   Social History   Socioeconomic History  . Marital status: Married    Spouse name: joan   . Number of children: 2  . Years of education: None  . Highest education level: Bachelor's degree (e.g., BA, AB, BS)  Social Needs  . Financial resource strain: Not hard at all  . Food insecurity - worry: Never true  . Food insecurity - inability: Never true  . Transportation needs - medical: No  .  Transportation needs - non-medical: No  Occupational History  . None  Tobacco Use  . Smoking status: Current Every Day Smoker    Packs/day: 0.25    Types: Cigarettes    Start date: 01/22/2017  . Smokeless tobacco: Never Used  Substance and Sexual Activity  . Alcohol use: Yes    Alcohol/week: 3.6 oz    Types: 6 Cans of beer per week    Comment: on occasion  . Drug use: No  . Sexual activity: Yes    Birth control/protection: None  Other Topics Concern  . None  Social History Narrative  . None    Additional Social History: Lives with his wife and 2 children.  Allergies:  No Known Allergies  Metabolic Disorder Labs: No results found for: HGBA1C, MPG No results found for: PROLACTIN Lab Results  Component Value Date   CHOL 130 03/07/2017   TRIG 167 (H) 03/07/2017   HDL 55 03/07/2017   CHOLHDL 2.4 03/07/2017   VLDL 13 08/06/2016   LDLCALC 42 03/07/2017   LDLCALC 58 02/13/2016     Current Medications: Current Outpatient Medications  Medication Sig Dispense Refill  . amLODipine (NORVASC) 5 MG tablet Take 1 tablet (5 mg total) by mouth daily. 30 tablet 12  . ARIPiprazole (ABILIFY) 2 MG tablet Take 1 tablet (2 mg total) by mouth daily. 30 tablet 2  . atorvastatin (LIPITOR) 40 MG tablet Take  1 tablet (40 mg total) by mouth daily. 30 tablet 12  . buPROPion (WELLBUTRIN XL) 150 MG 24 hr tablet Take 1 tablet (150 mg total) by mouth every morning. 30 tablet 2  . insulin lispro (HUMALOG) 100 UNIT/ML injection Use as directed in insulin pump. MDD: 90 units. E10.65    . Insulin Pen Needle 29G X 12.7MM MISC 1 each by Does not apply route 3 (three) times daily. 100 each 12  . ondansetron (ZOFRAN ODT) 4 MG disintegrating tablet Take 1 tablet (4 mg total) by mouth every 8 (eight) hours as needed for nausea or vomiting. 30 tablet 1  . pantoprazole (PROTONIX) 40 MG tablet Take 1 tablet (40 mg total) by mouth 2 (two) times daily. 60 tablet 12  . ranitidine (ZANTAC) 150 MG tablet Take 1  tablet (150 mg total) by mouth at bedtime. 30 tablet 12   No current facility-administered medications for this visit.     Neurologic: Headache: No Seizure: No Paresthesias:No  Musculoskeletal: Strength & Muscle Tone: within normal limits Gait & Station: normal Patient leans: N/A  Psychiatric Specialty Exam: ROS  Blood pressure 127/84, pulse (!) 109, weight 93.5 kg (206 lb 3.2 oz).Body mass index is 30.27 kg/m.  General Appearance: Casual  Eye Contact:  Fair  Speech:  Clear and Coherent  Volume:  Normal  Mood: good  Affect:  Congruent  Thought Process:  Coherent  Orientation:  Full (Time, Place, and Person)  Thought Content:  Logical  Suicidal Thoughts:  No  Homicidal Thoughts:  No  Memory:  Immediate;   Fair Recent;   Fair Remote;   Fair  Judgement:  Fair  Insight:  Fair  Psychomotor Activity:  Normal  Concentration:  Concentration: Fair and Attention Span: Fair  Recall:  [employer] of Knowledge:Fair  Language: Fair  Akathisia:  No  Handed:  Right  AIMS (if indicated):    Assets:  Communication Skills Desire for Improvement Financial Resources/Insurance Housing Physical Health Social Support Vocational/Educational  ADL's:  Intact  Cognition: WNL  Sleep:  improved    Treatment Plan Summary:  Major depressive disorder moderate Continue Wellbutrin at 131m daily in the morning.  Continue Abilify to 2 mg to take once daily Labs checked recently, lipid profile within normal limits.  Generalized anxiety disorder Same as above  Return to clinic in 3 months time or call before if needed     HElvin So MD 2/6/201912:16 PM

## 2017-03-19 ENCOUNTER — Encounter: Payer: Self-pay | Admitting: Psychiatry

## 2017-04-10 DIAGNOSIS — E109 Type 1 diabetes mellitus without complications: Secondary | ICD-10-CM | POA: Diagnosis not present

## 2017-07-19 DIAGNOSIS — E109 Type 1 diabetes mellitus without complications: Secondary | ICD-10-CM | POA: Diagnosis not present

## 2017-07-19 DIAGNOSIS — E1069 Type 1 diabetes mellitus with other specified complication: Secondary | ICD-10-CM | POA: Diagnosis not present

## 2017-07-19 DIAGNOSIS — E1159 Type 2 diabetes mellitus with other circulatory complications: Secondary | ICD-10-CM | POA: Diagnosis not present

## 2017-07-22 DIAGNOSIS — E109 Type 1 diabetes mellitus without complications: Secondary | ICD-10-CM | POA: Diagnosis not present

## 2017-08-05 ENCOUNTER — Ambulatory Visit: Payer: 59 | Admitting: Psychiatry

## 2017-08-05 ENCOUNTER — Encounter: Payer: Self-pay | Admitting: Psychiatry

## 2017-08-05 ENCOUNTER — Other Ambulatory Visit: Payer: Self-pay

## 2017-08-05 VITALS — BP 112/69 | HR 96 | Temp 98.8°F | Wt 206.8 lb

## 2017-08-05 DIAGNOSIS — F411 Generalized anxiety disorder: Secondary | ICD-10-CM | POA: Diagnosis not present

## 2017-08-05 DIAGNOSIS — F331 Major depressive disorder, recurrent, moderate: Secondary | ICD-10-CM | POA: Diagnosis not present

## 2017-08-05 MED ORDER — ARIPIPRAZOLE 2 MG PO TABS: 2.0000 mg | ORAL_TABLET | Freq: Every day | ORAL | 2 refills | Status: DC

## 2017-08-05 MED ORDER — BUPROPION HCL ER (XL) 150 MG PO TB24: 150.0000 mg | ORAL_TABLET | ORAL | 2 refills | Status: DC

## 2017-08-05 NOTE — Progress Notes (Signed)
Psychiatric progress note  Patient Identification: Bernard [name] MRN:  [id number] Date of Evaluation:  08/05/2017 Referral Source: Dr.Crissman Chief Complaint:  Doing well Chief Complaint    Follow-up; Medication Refill     Visit Diagnosis:    ICD-10-CM   1. MDD (major depressive disorder), recurrent episode, moderate (HCC) F33.1   2. GAD (generalized anxiety disorder) F41.1     History of Present Illness:  Patient is a 42 year old Caucasian male here for a follow up for depression. Reports doing quite well. States that he has been seeing a therapist and he has been doing quite well. Enjoying his new job. 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 Enjoying the new house. Sleeping well and eating well. Doing well on his medications.  Past Psychiatric History: No hospitalizations.  Previous Psychotropic Medications: Yes   Substance Abuse History in the last 12 months:  No.  Consequences of Substance Abuse: Negative  Past Medical History:  Past Medical History:  Diagnosis Date  . Anxiety   . Depression   . Esophageal stricture   . Hyperlipidemia   . Hypertension     Past Surgical History:  Procedure Laterality Date  . PILONIDAL CYST EXCISION    . WISDOM TOOTH EXTRACTION      Family Psychiatric History: see below  Family History:  Family History  Problem Relation Age of Onset  . Cancer Mother   . Anxiety disorder Mother   . Depression Mother   . Cancer Father   . Anxiety disorder Brother   . Depression Brother     Social History:   Social History   Socioeconomic History  . Marital status: Married    Spouse name: joan   . Number of children: 2  . Years of education: Not on file  . Highest education level: Bachelor's degree (e.g., BA, AB, BS)  Occupational History  . Not on file  Social Needs  . Financial resource strain: Not hard at all  . Food insecurity:    Worry: Never true    Inability: Never true  . Transportation needs:     Medical: No    Non-medical: No  Tobacco Use  . Smoking status: Current Every Day Smoker    Packs/day: 0.25    Types: Cigarettes    Start date: 01/22/2017  . Smokeless tobacco: Never Used  Substance and Sexual Activity  . Alcohol use: Yes    Alcohol/week: 1.2 - 3.6 oz    Types: 2 - 6 Cans of beer per week    Comment: on occasion  . Drug use: No  . Sexual activity: Yes    Birth control/protection: None  Lifestyle  . Physical activity:    Days per week: 0 days    Minutes per session: 0 min  . Stress: Very much  Relationships  . Social connections:    Talks on phone: More than three times a week    Gets together: Twice a week    Attends religious service: More than 4 times per year    Active member of club or organization: Yes    Attends meetings of clubs or organizations: More than 4 times per year    Relationship status: Married  Other Topics Concern  . Not on file  Social History Narrative  . Not on file    Additional Social History: Lives with his wife and 2 children.  Allergies:  No Known Allergies  Metabolic Disorder Labs: No results found for: HGBA1C, MPG No results found for:  PROLACTIN Lab Results  Component Value Date   CHOL 130 03/07/2017   TRIG 167 (H) 03/07/2017   HDL 55 03/07/2017   CHOLHDL 2.4 03/07/2017   VLDL 13 08/06/2016   LDLCALC 42 03/07/2017   LDLCALC 58 02/13/2016     Current Medications: Current Outpatient Medications  Medication Sig Dispense Refill  . amLODipine (NORVASC) 5 MG tablet Take 1 tablet (5 mg total) by mouth daily. 30 tablet 12  . ARIPiprazole (ABILIFY) 2 MG tablet Take 1 tablet (2 mg total) by mouth daily. 30 tablet 2  . atorvastatin (LIPITOR) 40 MG tablet Take 1 tablet (40 mg total) by mouth daily. 30 tablet 12  . buPROPion (WELLBUTRIN XL) 150 MG 24 hr tablet Take 1 tablet (150 mg total) by mouth every morning. 30 tablet 2  . insulin lispro (HUMALOG) 100 UNIT/ML injection Use as directed in insulin pump. MDD: 90 units.  E10.65    . Insulin Pen Needle 29G X 12.7MM MISC 1 each by Does not apply route 3 (three) times daily. 100 each 12  . ondansetron (ZOFRAN ODT) 4 MG disintegrating tablet Take 1 tablet (4 mg total) by mouth every 8 (eight) hours as needed for nausea or vomiting. 30 tablet 1  . pantoprazole (PROTONIX) 40 MG tablet Take 1 tablet (40 mg total) by mouth 2 (two) times daily. 60 tablet 12  . ranitidine (ZANTAC) 150 MG tablet Take 1 tablet (150 mg total) by mouth at bedtime. 30 tablet 12   No current facility-administered medications for this visit.     Neurologic: Headache: No Seizure: No Paresthesias:No  Musculoskeletal: Strength & Muscle Tone: within normal limits Gait & Station: normal Patient leans: N/A  Psychiatric Specialty Exam: ROS  Blood pressure 112/69, pulse 96, temperature 98.8 F (37.1 C), temperature source Oral, weight 93.8 kg (206 lb 12.8 oz).Body mass index is 30.36 kg/m.  General Appearance: Casual  Eye Contact:  Fair  Speech:  Clear and Coherent  Volume:  Normal  Mood: good  Affect:  Congruent  Thought Process:  Coherent  Orientation:  Full (Time, Place, and Person)  Thought Content:  Logical  Suicidal Thoughts:  No  Homicidal Thoughts:  No  Memory:  Immediate;   Fair Recent;   Fair Remote;   Fair  Judgement:  Fair  Insight:  Fair  Psychomotor Activity:  Normal  Concentration:  Concentration: Fair and Attention Span: Fair  Recall:  [employer] of Knowledge:Fair  Language: Fair  Akathisia:  No  Handed:  Right  AIMS (if indicated):    Assets:  Communication Skills Desire for Improvement Financial Resources/Insurance Housing Physical Health Social Support Vocational/Educational  ADL's:  Intact  Cognition: WNL  Sleep:  improved    Treatment Plan Summary:  Major depressive disorder moderate Continue Wellbutrin at 122m daily in the morning.  Continue Abilify to 2 mg to take once daily Labs checked recently, lipid profile within normal  limits.  Generalized anxiety disorder Same as above  Return to clinic in 3 months time or call before if needed     HElvin So MD 7/1/201910:05 AM

## 2017-09-10 ENCOUNTER — Ambulatory Visit: Payer: Self-pay | Admitting: Family Medicine

## 2017-10-15 DIAGNOSIS — E109 Type 1 diabetes mellitus without complications: Secondary | ICD-10-CM | POA: Diagnosis not present

## 2017-10-17 DIAGNOSIS — E109 Type 1 diabetes mellitus without complications: Secondary | ICD-10-CM | POA: Diagnosis not present

## 2017-10-21 ENCOUNTER — Telehealth: Payer: Self-pay | Admitting: Family Medicine

## 2017-10-21 ENCOUNTER — Other Ambulatory Visit: Payer: Self-pay | Admitting: Family Medicine

## 2017-10-21 DIAGNOSIS — I10 Essential (primary) hypertension: Secondary | ICD-10-CM

## 2017-10-21 DIAGNOSIS — E7800 Pure hypercholesterolemia, unspecified: Secondary | ICD-10-CM

## 2017-10-21 MED ORDER — ATORVASTATIN CALCIUM 40 MG PO TABS: 40.0000 mg | ORAL_TABLET | Freq: Every day | ORAL | 1 refills | Status: DC

## 2017-10-21 MED ORDER — PANTOPRAZOLE SODIUM 40 MG PO TBEC: 40.0000 mg | DELAYED_RELEASE_TABLET | Freq: Two times a day (BID) | ORAL | 1 refills | Status: DC

## 2017-10-21 MED ORDER — RANITIDINE HCL 150 MG PO TABS: 150.0000 mg | ORAL_TABLET | Freq: Every day | ORAL | 1 refills | Status: DC

## 2017-10-21 MED ORDER — ONDANSETRON 4 MG PO TBDP: 4.0000 mg | ORAL_TABLET | Freq: Three times a day (TID) | ORAL | 1 refills | Status: DC | PRN

## 2017-10-21 MED ORDER — AMLODIPINE BESYLATE 5 MG PO TABS: 5.0000 mg | ORAL_TABLET | Freq: Every day | ORAL | 1 refills | Status: DC

## 2017-10-21 NOTE — Telephone Encounter (Signed)
ondansetron refill Last Refill:03/07/17 # 30 12 RF Last OV: 03/07/17 PCP: Dr Jeananne Rama Pharmacy:[facility]  Pt wanting 90 day fill

## 2017-10-21 NOTE — Telephone Encounter (Signed)
amlodipine refill Last Refill:03/07/17 # 30 12 RF Last OV: 03/07/17 PCP: Dr Jeananne Rama Pharmacy:[facility] [address]   atorvastatin refill Last Refill:03/07/17 # 30 12 RF Last OV: 03/07/17   pantoprazole refill Last Refill:03/07/17 # 60 12 RF Last OV: 03/07/17  Ranitidine refill Last Refill: 03/07/17  Last OV: 03/07/17  Pt is wanting rx sent to [facility] and changed to 90 days. Fill until January.

## 2017-10-21 NOTE — Telephone Encounter (Signed)
Copied from [place] [id number]. Topic: Quick Communication - Rx Refill/Question >> Oct 21, 2017 12:23 PM Celedonio Savage L wrote: Medication: amLODipine (NORVASC) 5 MG tablet 90 day supply    ARIPiprazole (ABILIFY) 2 MG tablet 90 day supply    atorvastatin (LIPITOR) 40 MG tablet 90 day supply    buPROPion (WELLBUTRIN XL) 150 MG 24 hr tablet90 day supply     ondansetron (ZOFRAN ODT) 4 MG disintegrating tablet 90 day supply    pantoprazole (PROTONIX) 40 MG tablet 90 day supply    ranitidine (ZANTAC) 150 MG tablet 90 day supply    Has the patient contacted their pharmacy? Yes.  And this pharmacy is new to pt (Agent: If no, request that the patient contact the pharmacy for the refill.) (Agent: If yes, when and what did the pharmacy advise?) they advised him to call provider to get a 90 supply on all medicine  Preferred Pharmacy (with phone number or street name):     [place], [place] [phone] (Phone) [phone] (Fax)    Agent: Please be advised that RX refills may take up to 3 business days. We ask that you follow-up with your pharmacy.

## 2017-10-24 ENCOUNTER — Ambulatory Visit: Payer: 59 | Admitting: Psychiatry

## 2017-10-24 ENCOUNTER — Encounter: Payer: Self-pay | Admitting: Psychiatry

## 2017-10-24 ENCOUNTER — Other Ambulatory Visit: Payer: Self-pay

## 2017-10-24 VITALS — BP 126/83 | HR 109 | Temp 98.7°F | Wt 212.0 lb

## 2017-10-24 DIAGNOSIS — F411 Generalized anxiety disorder: Secondary | ICD-10-CM

## 2017-10-24 DIAGNOSIS — F331 Major depressive disorder, recurrent, moderate: Secondary | ICD-10-CM

## 2017-10-24 MED ORDER — BUPROPION HCL ER (SR) 200 MG PO TB12: ORAL_TABLET | ORAL | 1 refills | Status: DC

## 2017-10-24 MED ORDER — ARIPIPRAZOLE 2 MG PO TABS: 2.0000 mg | ORAL_TABLET | Freq: Every day | ORAL | 1 refills | Status: DC

## 2017-10-24 NOTE — Progress Notes (Signed)
Psychiatric progress note  Patient Identification: Bernard [name] MRN:  [id number] Date of Evaluation:  10/24/2017 Referral Source: Dr.Crissman Chief Complaint:  Increased depression Chief Complaint    Follow-up; Medication Refill     Visit Diagnosis:    ICD-10-CM   1. MDD (major depressive disorder), recurrent episode, moderate (HCC) F33.1   2. GAD (generalized anxiety disorder) F41.1     History of Present Illness:  Patient is a 42 year old Caucasian male here for a follow up for depression. Reports that he will be changing jobs soon and requested an earlier appointment so as to get refills on his prescriptions.. States that he has been seeing a therapist . He has started having some bad lows recently. It has happened a number of times over past month.  States that during these times he feels like getting in the bed and not coming out.Denies any sucidal thoughts. States that it gets better towards the end of the day when there are people in the house. Otherwise things are well with his family.Sleeping well and eating well. Doing well on his medications.  Past Psychiatric History: No hospitalizations.  Previous Psychotropic Medications: Yes   Substance Abuse History in the last 12 months:  No.  Consequences of Substance Abuse: Negative  Past Medical History:  Past Medical History:  Diagnosis Date  . Anxiety   . Depression   . Esophageal stricture   . Hyperlipidemia   . Hypertension     Past Surgical History:  Procedure Laterality Date  . PILONIDAL CYST EXCISION    . WISDOM TOOTH EXTRACTION      Family Psychiatric History: see below  Family History:  Family History  Problem Relation Age of Onset  . Cancer Mother   . Anxiety disorder Mother   . Depression Mother   . Cancer Father   . Anxiety disorder Brother   . Depression Brother     Social History:   Social History   Socioeconomic History  . Marital status: Married    Spouse name: joan   . Number  of children: 2  . Years of education: Not on file  . Highest education level: Bachelor's degree (e.g., BA, AB, BS)  Occupational History  . Not on file  Social Needs  . Financial resource strain: Not hard at all  . Food insecurity:    Worry: Never true    Inability: Never true  . Transportation needs:    Medical: No    Non-medical: No  Tobacco Use  . Smoking status: Current Every Day Smoker    Packs/day: 0.25    Types: Cigarettes    Start date: 01/22/2017  . Smokeless tobacco: Never Used  Substance and Sexual Activity  . Alcohol use: Yes    Alcohol/week: 2.0 - 6.0 standard drinks    Types: 2 - 6 Cans of beer per week    Comment: on occasion  . Drug use: No  . Sexual activity: Yes    Birth control/protection: None  Lifestyle  . Physical activity:    Days per week: 0 days    Minutes per session: 0 min  . Stress: Very much  Relationships  . Social connections:    Talks on phone: More than three times a week    Gets together: Twice a week    Attends religious service: More than 4 times per year    Active member of club or organization: Yes    Attends meetings of clubs or organizations: More than 4 times per  year    Relationship status: Married  Other Topics Concern  . Not on file  Social History Narrative  . Not on file    Additional Social History: Lives with his wife and 2 children.  Allergies:  No Known Allergies  Metabolic Disorder Labs: No results found for: HGBA1C, MPG No results found for: PROLACTIN Lab Results  Component Value Date   CHOL 130 03/07/2017   TRIG 167 (H) 03/07/2017   HDL 55 03/07/2017   CHOLHDL 2.4 03/07/2017   VLDL 13 08/06/2016   LDLCALC 42 03/07/2017   LDLCALC 58 02/13/2016     Current Medications: Current Outpatient Medications  Medication Sig Dispense Refill  . amLODipine (NORVASC) 5 MG tablet Take 1 tablet (5 mg total) by mouth daily. 90 tablet 1  . ARIPiprazole (ABILIFY) 2 MG tablet Take 1 tablet (2 mg total) by mouth  daily. 90 tablet 1  . atorvastatin (LIPITOR) 40 MG tablet Take 1 tablet (40 mg total) by mouth daily. 90 tablet 1  . buPROPion (WELLBUTRIN SR) 200 MG 12 hr tablet Take 1 tablet by mouth daily in the morning 90 tablet 1  . insulin lispro (HUMALOG) 100 UNIT/ML injection Use as directed in insulin pump. MDD: 90 units. E10.65    . Insulin Pen Needle 29G X 12.7MM MISC 1 each by Does not apply route 3 (three) times daily. 100 each 12  . ondansetron (ZOFRAN ODT) 4 MG disintegrating tablet Take 1 tablet (4 mg total) by mouth every 8 (eight) hours as needed for nausea or vomiting. 30 tablet 1  . pantoprazole (PROTONIX) 40 MG tablet Take 1 tablet (40 mg total) by mouth 2 (two) times daily. 180 tablet 1  . ranitidine (ZANTAC) 150 MG tablet Take 1 tablet (150 mg total) by mouth at bedtime. 90 tablet 1   No current facility-administered medications for this visit.     Neurologic: Headache: No Seizure: No Paresthesias:No  Musculoskeletal: Strength & Muscle Tone: within normal limits Gait & Station: normal Patient leans: N/A  Psychiatric Specialty Exam: ROS  Blood pressure 126/83, pulse (!) 109, temperature 98.7 F (37.1 C), temperature source Oral, weight 212 lb (96.2 kg).Body mass index is 31.13 kg/m.  General Appearance: Casual  Eye Contact:  Fair  Speech:  Clear and Coherent  Volume:  Normal  Mood: depressed mood  Affect:  Congruent  Thought Process:  Coherent  Orientation:  Full (Time, Place, and Person)  Thought Content:  Logical  Suicidal Thoughts:  No  Homicidal Thoughts:  No  Memory:  Immediate;   Fair Recent;   Fair Remote;   Fair  Judgement:  Fair  Insight:  Fair  Psychomotor Activity:  Normal  Concentration:  Concentration: Fair and Attention Span: Fair  Recall:  [employer] of Knowledge:Fair  Language: Fair  Akathisia:  No  Handed:  Right  AIMS (if indicated):    Assets:  Communication Skills Desire for Improvement Financial Resources/Insurance Housing Physical  Health Social Support Vocational/Educational  ADL's:  Intact  Cognition: WNL  Sleep:  improved    Treatment Plan Summary:  Major depressive disorder moderate Increase Wellbutrin at 260m daily in the morning.  Continue Abilify to 2 mg to take once daily Patient recommended to exercise frequently with 3-4 times a week of cardio.  Generalized anxiety disorder Same as above  Return to clinic in 1 months time or call before if needed     HElvin So MD 9/19/20193:05 PM

## 2017-10-28 ENCOUNTER — Telehealth: Payer: Self-pay

## 2017-10-28 NOTE — Telephone Encounter (Signed)
[facility] for a [facility] and also the bupropion. needs a 90 day supply on both medications. pt last seen on  10-24-17 next apt 01-21-18

## 2017-10-29 NOTE — Telephone Encounter (Signed)
He was given prescriptions for 90 day supply

## 2017-10-30 NOTE — Telephone Encounter (Signed)
Pharmacy notified.

## 2017-11-04 NOTE — Telephone Encounter (Signed)
pt called left message that he needs a rx for the aripiprazole [facility]

## 2017-11-05 NOTE — Telephone Encounter (Signed)
He was given a prescription and he can fax it to them.

## 2017-11-07 ENCOUNTER — Ambulatory Visit: Payer: Self-pay | Admitting: Psychiatry

## 2017-11-18 NOTE — Telephone Encounter (Signed)
pt states he does not have the rx and the pharmacy does not have them can you please resend rx for medications.

## 2018-01-21 ENCOUNTER — Ambulatory Visit: Payer: 59 | Admitting: Psychiatry

## 2018-02-06 ENCOUNTER — Telehealth: Payer: Self-pay

## 2018-02-06 MED ORDER — FAMOTIDINE 40 MG PO TABS: 40.0000 mg | ORAL_TABLET | Freq: Every day | ORAL | 2 refills | Status: DC

## 2018-02-06 NOTE — Telephone Encounter (Signed)
Pepcid sent

## 2018-02-06 NOTE — Telephone Encounter (Signed)
[facility] sent a fax stating that Ranitidine is on a manufacturer backorder. They are requesting an alternative be sent in. Please advise.

## 2018-02-06 NOTE — Telephone Encounter (Signed)
Left message on machine for pt to return call to the office.  

## 2018-02-06 NOTE — Telephone Encounter (Signed)
CRM created. 

## 2018-02-07 NOTE — Telephone Encounter (Signed)
Message relayed to patient. Verbalized understanding and denied questions.   

## 2018-02-10 DIAGNOSIS — E1159 Type 2 diabetes mellitus with other circulatory complications: Secondary | ICD-10-CM | POA: Diagnosis not present

## 2018-02-10 DIAGNOSIS — E109 Type 1 diabetes mellitus without complications: Secondary | ICD-10-CM | POA: Diagnosis not present

## 2018-02-10 DIAGNOSIS — E1069 Type 1 diabetes mellitus with other specified complication: Secondary | ICD-10-CM | POA: Diagnosis not present

## 2018-02-10 DIAGNOSIS — E785 Hyperlipidemia, unspecified: Secondary | ICD-10-CM | POA: Diagnosis not present

## 2018-02-10 DIAGNOSIS — I10 Essential (primary) hypertension: Secondary | ICD-10-CM | POA: Diagnosis not present

## 2018-02-11 DIAGNOSIS — M9902 Segmental and somatic dysfunction of thoracic region: Secondary | ICD-10-CM | POA: Diagnosis not present

## 2018-02-11 DIAGNOSIS — M7542 Impingement syndrome of left shoulder: Secondary | ICD-10-CM | POA: Diagnosis not present

## 2018-02-11 DIAGNOSIS — M9901 Segmental and somatic dysfunction of cervical region: Secondary | ICD-10-CM | POA: Diagnosis not present

## 2018-02-13 DIAGNOSIS — E109 Type 1 diabetes mellitus without complications: Secondary | ICD-10-CM | POA: Diagnosis not present

## 2018-02-17 ENCOUNTER — Telehealth: Payer: Self-pay | Admitting: Psychiatry

## 2018-02-17 ENCOUNTER — Telehealth: Payer: Self-pay

## 2018-02-17 MED ORDER — BUPROPION HCL ER (SR) 200 MG PO TB12: ORAL_TABLET | ORAL | 0 refills | Status: DC

## 2018-02-17 NOTE — Telephone Encounter (Signed)
   received a fax requesting a refill on bupripion hcl er 123m    buPROPion (WELLBUTRIN SR) 200 MG 12 hr tablet  Medication  Date: 10/24/2017 [facility] Ordering/Authorizing: RElvin So MD  Order Providers   Prescribing Provider Encounter Provider  RElvin So MD RElvin So MD  Outpatient Medication Detail    Disp Refills Start End   buPROPion ([facility]) 200 MG 12 hr tablet 90 tablet 1 10/24/2017    Sig: Take 1 tablet by mouth daily in the morning   Class: Print

## 2018-02-17 NOTE — Telephone Encounter (Signed)
  Sent wellbutrin to pharmacy for 30 days. Pt needs appointment

## 2018-02-18 ENCOUNTER — Ambulatory Visit (INDEPENDENT_AMBULATORY_CARE_PROVIDER_SITE_OTHER): Payer: 59 | Admitting: Psychiatry

## 2018-02-18 ENCOUNTER — Encounter: Payer: Self-pay | Admitting: Psychiatry

## 2018-02-18 ENCOUNTER — Other Ambulatory Visit: Payer: Self-pay

## 2018-02-18 VITALS — BP 133/85 | HR 102 | Temp 99.2°F | Wt 214.2 lb

## 2018-02-18 DIAGNOSIS — F17200 Nicotine dependence, unspecified, uncomplicated: Secondary | ICD-10-CM | POA: Diagnosis not present

## 2018-02-18 DIAGNOSIS — F411 Generalized anxiety disorder: Secondary | ICD-10-CM | POA: Diagnosis not present

## 2018-02-18 DIAGNOSIS — F3181 Bipolar II disorder: Secondary | ICD-10-CM

## 2018-02-18 DIAGNOSIS — M9901 Segmental and somatic dysfunction of cervical region: Secondary | ICD-10-CM | POA: Diagnosis not present

## 2018-02-18 DIAGNOSIS — M7542 Impingement syndrome of left shoulder: Secondary | ICD-10-CM | POA: Diagnosis not present

## 2018-02-18 DIAGNOSIS — M9902 Segmental and somatic dysfunction of thoracic region: Secondary | ICD-10-CM | POA: Diagnosis not present

## 2018-02-18 MED ORDER — LAMOTRIGINE 25 MG PO TABS: 25.0000 mg | ORAL_TABLET | Freq: Every day | ORAL | 0 refills | Status: DC

## 2018-02-18 MED ORDER — BUPROPION HCL 100 MG PO TABS: 100.0000 mg | ORAL_TABLET | Freq: Every day | ORAL | 1 refills | Status: DC

## 2018-02-18 MED ORDER — ARIPIPRAZOLE 2 MG PO TABS: 3.0000 mg | ORAL_TABLET | Freq: Every day | ORAL | 0 refills | Status: DC

## 2018-02-18 NOTE — Patient Instructions (Signed)
Lamotrigine tablets What is this medicine? LAMOTRIGINE (la MOE Hendricks Limes) is used to control seizures in adults and children with epilepsy and Lennox-Gastaut syndrome. It is also used in adults to treat bipolar disorder. This medicine may be used for other purposes; ask your health care provider or pharmacist if you have questions. COMMON BRAND NAME(S): Lamictal, Subvenite What should I tell my health care provider before I take this medicine? They need to know if you have any of these conditions: -aseptic meningitis during prior use of lamotrigine -depression -folate deficiency -kidney disease -liver disease -suicidal thoughts, plans, or attempt; a previous suicide attempt by you or a family member -an unusual or allergic reaction to lamotrigine or other seizure medications, other medicines, foods, dyes, or preservatives -pregnant or trying to get pregnant -breast-feeding How should I use this medicine? Take this medicine by mouth with a glass of water. Follow the directions on the prescription label. Do not chew these tablets. If this medicine upsets your stomach, take it with food or milk. Take your doses at regular intervals. Do not take your medicine more often than directed. A special MedGuide will be given to you by the pharmacist with each new prescription and refill. Be sure to read this information carefully each time. Talk to your pediatrician regarding the use of this medicine in children. While this drug may be prescribed for children as young as 2 years for selected conditions, precautions do apply. Overdosage: If you think you have taken too much of this medicine contact a poison control center or emergency room at once. NOTE: This medicine is only for you. Do not share this medicine with others. What if I miss a dose? If you miss a dose, take it as soon as you can. If it is almost time for your next dose, take only that dose. Do not take double or extra doses. What may interact  with this medicine? -atazanavir -carbamazepine -male hormones, including contraceptive or birth control pills -lopinavir -methotrexate -phenobarbital -phenytoin -primidone -pyrimethamine -rifampin -ritonavir -trimethoprim -valproic acid This list may not describe all possible interactions. Give your health care provider a list of all the medicines, herbs, non-prescription drugs, or dietary supplements you use. Also tell them if you smoke, drink alcohol, or use illegal drugs. Some items may interact with your medicine. What should I watch for while using this medicine? Visit your doctor or health care professional for regular checks on your progress. If you take this medicine for seizures, wear a Medic Alert bracelet or necklace. Carry an identification card with information about your condition, medicines, and doctor or health care professional. It is important to take this medicine exactly as directed. When first starting treatment, your dose will need to be adjusted slowly. It may take weeks or months before your dose is stable. You should contact your doctor or health care professional if your seizures get worse or if you have any new types of seizures. Do not stop taking this medicine unless instructed by your doctor or health care professional. Stopping your medicine suddenly can increase your seizures or their severity. Contact your doctor or health care professional right away if you develop a rash while taking this medicine. Rashes may be very severe and sometimes require treatment in the hospital. Deaths from rashes have occurred. Serious rashes occur more often in children than adults taking this medicine. It is more common for these serious rashes to occur during the first 2 months of treatment, but a rash can occur at  any time. You may get drowsy, dizzy, or have blurred vision. Do not drive, use machinery, or do anything that needs mental alertness until you know how this medicine  affects you. To reduce dizzy or fainting spells, do not sit or stand up quickly, especially if you are an older patient. Alcohol can increase drowsiness and dizziness. Avoid alcoholic drinks. If you are taking this medicine for bipolar disorder, it is important to report any changes in your mood to your doctor or health care professional. If your condition gets worse, you get mentally depressed, feel very hyperactive or manic, have difficulty sleeping, or have thoughts of hurting yourself or committing suicide, you need to get help from your health care professional right away. If you are a caregiver for someone taking this medicine for bipolar disorder, you should also report these behavioral changes right away. The use of this medicine may increase the chance of suicidal thoughts or actions. Pay special attention to how you are responding while on this medicine. Your mouth may get dry. Chewing sugarless gum or sucking hard candy, and drinking plenty of water may help. Contact your doctor if the problem does not go away or is severe. Women who become pregnant while using this medicine may enroll in the [place] Pregnancy Registry by calling [phone]. This registry collects information about the safety of antiepileptic drug use during pregnancy. This medicine may cause a decrease in folic acid. You should make sure that you get enough folic acid while you are taking this medicine. Discuss the foods you eat and the vitamins you take with your health care professional. What side effects may I notice from receiving this medicine? Side effects that you should report to your doctor or health care professional as soon as possible: -allergic reactions like skin rash, itching or hives, swelling of the face, lips, or tongue -changes in vision -depressed mood -elevated mood, decreased need for sleep, racing thoughts, impulsive behavior -fever with rash, swollen lymph nodes, or  swelling of the face -loss of balance or coordination -mouth sores -redness, blistering, peeling or loosening of the skin, including inside the mouth -right upper belly pain -seizures -severe muscle pain -signs and symptoms of aseptic meningitis such as stiff neck and sensitivity to light, headache, drowsiness, fever, nausea, vomiting, rash -signs of infection - fever or chills, cough, sore throat, pain or difficulty passing urine -suicidal thoughts or other mood changes -swollen lymph nodes -trouble walking -unusual bruising or bleeding -unusually weak or tired -yellowing of the eyes or skin Side effects that usually do not require medical attention (report to your doctor or health care professional if they continue or are bothersome): -diarrhea -dizziness -dry mouth -stuffy nose -tiredness -tremors -trouble sleeping This list may not describe all possible side effects. Call your doctor for medical advice about side effects. You may report side effects to FDA at 1-800-FDA-1088. Where should I keep my medicine? Keep out of reach of children. Store at room temperature between 15 and 30 degrees C (59 and 86 degrees F). Throw away any unused medicine after the expiration date. NOTE: This sheet is a summary. It may not cover all possible information. If you have questions about this medicine, talk to your doctor, pharmacist, or health care provider.  2019 Elsevier/Gold Standard (2016-09-10 16:07:39) Bipolar 2 Disorder Bipolar 2 disorder is a mental health disorder in which a person has episodes of emotional highs (mania) and lows (depression). Bipolar 2 is different from other bipolar disorders because the  manic episodes are not as high and do not last as long. This is called hypomania. People with bipolar 2 disorder usually go back and forth between hypomanic and depressive episodes. What are the causes? The cause of this condition is not known. What increases the risk? The following  factors may make you more likely to develop this condition:  Having a family member with the disorder.  An imbalance of certain chemicals in the brain (neurotransmitters).  Stress, such as a death, illness, or financial problems.  Certain conditions that affect the brain or spinal cord (neurologic conditions).  Brain injury (trauma).  Having another mental health disorder, such as: ? Obsessive compulsive disorder. ? Schizophrenia. What are the signs or symptoms? Symptoms of hypomania include:  Very high self-esteem or self-confidence.  Decreased need for sleep.  Unusual talkativeness or feeling a need to keep talking. Speech may be very fast. It may seem like you cannot stop talking.  Racing thoughts or constant talking, with quick shifts between topics that may or may not be related (flight of ideas).  Decreased ability to focus or concentrate.  Increased purposeful activity, such as work, studies, or social activity.  Increased nonproductive activity. This could be pacing, squirming and fidgeting, or finger and toe tapping.  Impulsive behavior and poor judgment. This may result in high-risk activities, such as having unprotected sex or spending a lot of money. Symptoms of depression include:  Feeling sad, hopeless, or helpless.  Frequent or uncontrollable crying.  Lack of feeling or caring about anything.  Sleeping too much.  Moving more slowly than usual.  Not being able to enjoy things you used to enjoy.  Desire to be alone all the time.  Feeling guilty or worthless.  Lack of energy or motivation.  Trouble concentrating or remembering.  Trouble making decisions.  Increased appetite.  Thoughts of death or desire to harm yourself. How is this diagnosed? To diagnose bipolar 2 disorder, your health care provider may ask about your:  Emotional episodes.  Medical history.  Alcohol and drug use. This includes prescription medicines. Certain medical  conditions and substances can cause symptoms that seem like bipolar disorder (secondary bipolar disorder). How is this treated? Bipolar 2 disorder is a long-term (chronic) illness. It is best controlled with ongoing (continuous) treatment rather than being treated only when symptoms occur. Treatment may include:  Psychotherapy. Some forms of talk therapy, such as cognitive-behavioral therapy (CBT), can provide support, education, and guidance.  Coping strategies, such as journaling or relaxation exercises. Relaxation exercises include: ? Yoga. ? Meditation. ? Deep breathing.  Lifestyle changes, such as: ? Limiting alcohol and drug use. ? Exercising regularly. ? Getting plenty of sleep. ? Making healthy eating choices.  Medicine. Medicine can be prescribed by a health care provider who specializes in treating mental disorders (psychiatrist). ? Medicines called mood stabilizers are usually prescribed. ? If symptoms occur even while taking a mood stabilizer, other medicines may be added. A combination of medicine, talk therapy, and coping methods is the best way to treat this condition. Follow these instructions at home: Activity  Return to your normal activities as told by your health care provider.  Find activities that you enjoy, and make time to do them.  Exercise regularly as told by your health care provider. Lifestyle  Limit alcohol intake to no more than 1 drink a day for nonpregnant women and 2 drinks a day for men. One drink equals 12 oz of beer, 5 oz of wine, or 1  oz of hard liquor.  Follow a set schedule for eating and sleeping.  Eat a balanced diet that includes fresh fruits and vegetables, whole grains, low-fat dairy, and lean meats.  Get at least 7-8 hours of sleep each night. General instructions  Take over-the-counter and prescription medicines only as told by your health care provider.  Think about joining a support group. Your health care provider may be  able to recommend a support group.  Talk with your family and loved ones about your treatment goals and how they can help.  Keep all follow-up visits as told by your health care provider. This is important. Where to find more information For more information about bipolar 2 disorder, visit the following websites:  [employer] on Mental Illness: www.nami.[place]: https://carter.com/ Contact a health care provider if:  Your symptoms get worse.  You have side effects from your medicine, and they get worse.  You have trouble sleeping.  You have trouble doing daily activities.  You feel unsafe in your surroundings.  You are dealing with substance abuse. Get help right away if:  You have new symptoms.  You have thoughts about harming yourself or others.  You harm yourself. Summary  Bipolar 2 disorder is a mental health disorder in which a person has episodes of hypomania and depression.  Bipolar 2 is best treated through a combination of medicines, talk therapy, and coping strategies.  Talk with your family and loved ones about your treatment goals and how they can help. This information is not intended to replace advice given to you by your health care provider. Make sure you discuss any questions you have with your health care provider. Document Released: 02/28/2016 Document Revised: 02/28/2016 Document Reviewed: 02/28/2016 Elsevier Interactive Patient Education  2019 [employer].

## 2018-02-18 NOTE — Progress Notes (Signed)
[facility] MD OP Progress Note  02/18/2018 5:54 PM Conlee Kamaal Cast  MRN:  [id number]  Chief Complaint:  Chief Complaint    Follow-up; Medication Refill    ' I am here for follow up.'  HPI: Bernard [name] is a 43 year old Caucasian male, employed, lives in [place], has a history of depression and anxiety presented to the clinic today for a follow-up visit.  Patient used to follow-up with Dr. Einar Grad here in clinic.  This is his first visit with Probation officer.  I have reviewed medical records from Dr. Yolande Jolly progress note dated 10/24/2017-' patient at that time was advised to increase the dosage of Wellbutrin and continue the Abilify.  Pt was advised to follow-up in clinic in 1 month.'  Today reports he could not return for an appointment sooner since his health insurance had changed since his job had changed.  Patient today reports he does not think the Wellbutrin increased dosage has been helpful.  He describes his symptoms as being restless, going through phases of high energy, hyperactivity, agitation, irritability, anger issues.  He also reports periods when he feels depressed, appears to be sad, withdrawn and does not talk to anyone for a few hours.  He reports sleep is good.  He reports he can work remotely several times a week.  He hence does not have to follow a routine.  There are days when he wants to sleep through the morning.  He however describes his sleep as restful.  Patient denies any suicidality.  Patient denies any perceptual disturbances.  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  He does have a history of generalized anxiety disorder.  Patient reports Abilify as helpful.  He reports he tried going up on the dosage of Abilify to 5 mg previously but that gave him side effects.  Discussed readjusting his Abilify to 3 mg today.  Also discussed starting a mood stabilizer like Lamictal.  Patient was advised to fill out a mood disorder questionnaire and  he scored very high on the same.   Visit Diagnosis:    ICD-10-CM   1. Bipolar 2 disorder (HCC) F31.81 ARIPiprazole (ABILIFY) 2 MG tablet    lamoTRIgine (LAMICTAL) 25 MG tablet    buPROPion (WELLBUTRIN) 100 MG tablet   mixed episodes  2. GAD (generalized anxiety disorder) F41.1   3. Tobacco use disorder F17.200     Past Psychiatric History: Pt used to follow-up with Dr. Einar Grad here in clinic.  Patient with previous diagnosis of depression and anxiety.  Patient denies inpatient mental health admissions.  Past trials of Abilify, risperidone, Wellbutrin  Past Medical History:  Past Medical History:  Diagnosis Date  . Anxiety   . Depression   . Esophageal stricture   . Hyperlipidemia   . Hypertension     Past Surgical History:  Procedure Laterality Date  . PILONIDAL CYST EXCISION    . WISDOM TOOTH EXTRACTION      Family Psychiatric History: Mother-mental illness ( possible bipolar ), brother-mental illness.  Family History:  Family History  Problem Relation Age of Onset  . Cancer Mother   . Anxiety disorder Mother   . Depression Mother   . Cancer Father   . Anxiety disorder Brother   . Depression Brother     Social History: Patient is married.  He has 2 children.  He is currently employed .  He lives in [place]. Social History   Socioeconomic History  . Marital status: Married  Spouse name: joan   . Number of children: 2  . Years of education: Not on file  . Highest education level: Bachelor's degree (e.g., BA, AB, BS)  Occupational History  . Not on file  Social Needs  . Financial resource strain: Not hard at all  . Food insecurity:    Worry: Never true    Inability: Never true  . Transportation needs:    Medical: No    Non-medical: No  Tobacco Use  . Smoking status: Current Every Day Smoker    Packs/day: 0.25    Types: Cigarettes    Start date: 01/22/2017  . Smokeless tobacco: Never Used  Substance and Sexual Activity  . Alcohol use: Yes     Alcohol/week: 2.0 - 6.0 standard drinks    Types: 2 - 6 Cans of beer per week    Comment: on occasion  . Drug use: No  . Sexual activity: Yes    Birth control/protection: None  Lifestyle  . Physical activity:    Days per week: 0 days    Minutes per session: 0 min  . Stress: Very much  Relationships  . Social connections:    Talks on phone: More than three times a week    Gets together: Twice a week    Attends religious service: More than 4 times per year    Active member of club or organization: Yes    Attends meetings of clubs or organizations: More than 4 times per year    Relationship status: Married  Other Topics Concern  . Not on file  Social History Narrative  . Not on file    Allergies: No Known Allergies  Metabolic Disorder Labs: Lab Results  Component Value Date   HGBA1C 8.8 (H) 07/11/2015   No results found for: PROLACTIN Lab Results  Component Value Date   CHOL 130 03/07/2017   TRIG 167 (H) 03/07/2017   HDL 55 03/07/2017   CHOLHDL 2.4 03/07/2017   VLDL 13 08/06/2016   LDLCALC 42 03/07/2017   LDLCALC 58 02/13/2016   Lab Results  Component Value Date   TSH 2.240 03/07/2017   TSH 1.660 02/13/2016    Therapeutic Level Labs: No results found for: LITHIUM No results found for: VALPROATE No components found for:  CBMZ  Current Medications: Current Outpatient Medications  Medication Sig Dispense Refill  . amLODipine (NORVASC) 5 MG tablet Take 1 tablet (5 mg total) by mouth daily. 90 tablet 1  . ARIPiprazole (ABILIFY) 2 MG tablet Take 1.5 tablets (3 mg total) by mouth daily. 45 tablet 0  . atorvastatin (LIPITOR) 40 MG tablet Take 1 tablet (40 mg total) by mouth daily. 90 tablet 1  . buPROPion (WELLBUTRIN) 100 MG tablet Take 1 tablet (100 mg total) by mouth daily. 30 tablet 1  . Cetirizine HCl 10 MG CAPS Take by mouth.    . colesevelam (WELCHOL) 625 MG tablet Take by mouth.    . famotidine (PEPCID) 40 MG tablet Take 1 tablet (40 mg total) by mouth  daily. 30 tablet 2  . glucose blood (PRECISION QID TEST) test strip Use 1 each (1 strip total) 4 (four) times daily Use as instructed.    . Insulin Glargine (LANTUS SOLOSTAR) 100 UNIT/ML Solostar Pen 50 units daily when off pump    . Insulin Glargine (LANTUS SOLOSTAR) 100 UNIT/ML Solostar Pen Inject into the skin.    [name] insulin lispro (HUMALOG) 100 UNIT/ML injection Use as directed in insulin pump. MDD: 90 units. E10.65    .  insulin lispro (HUMALOG) 100 UNIT/ML injection Up to 100 units daily in pump    . Insulin Pen Needle 29G X 12.7MM MISC 1 each by Does not apply route 3 (three) times daily. 100 each 12  . lamoTRIgine (LAMICTAL) 25 MG tablet Take 1 tablet (25 mg total) by mouth daily. 30 tablet 0  . ondansetron (ZOFRAN ODT) 4 MG disintegrating tablet Take 1 tablet (4 mg total) by mouth every 8 (eight) hours as needed for nausea or vomiting. 30 tablet 1  . pantoprazole (PROTONIX) 40 MG tablet Take 1 tablet (40 mg total) by mouth 2 (two) times daily. 180 tablet 1  . ranitidine (ZANTAC) 150 MG tablet Take 1 tablet (150 mg total) by mouth at bedtime. 90 tablet 1   No current facility-administered medications for this visit.      Musculoskeletal: Strength & Muscle Tone: within normal limits Gait & Station: normal Patient leans: N/A  Psychiatric Specialty Exam: Review of Systems  Psychiatric/Behavioral: Positive for depression. The patient is nervous/anxious.   All other systems reviewed and are negative.   Blood pressure 133/85, pulse (!) 102, temperature 99.2 F (37.3 C), temperature source Oral, weight 214 lb 3.2 oz (97.2 kg).Body mass index is 31.45 kg/m.  General Appearance: Casual  Eye Contact:  Fair  Speech:  Clear and Coherent  Volume:  Normal  Mood:  Anxious  Affect:  Congruent  Thought Process:  Goal Directed and Descriptions of Associations: Intact  Orientation:  Full (Time, Place, and Person)  Thought Content: Logical   Suicidal Thoughts:  No  Homicidal Thoughts:  No   Memory:  Immediate;   Fair Recent;   Fair Remote;   Fair  Judgement:  Fair  Insight:  Fair  Psychomotor Activity:  Normal  Concentration:  Concentration: Fair and Attention Span: Fair  Recall:  [employer] of Knowledge: Fair  Language: Fair  Akathisia:  No  Handed:  Right  AIMS (if indicated): 0  Assets:  Communication Skills Desire for Improvement Social Support  ADL's:  Intact  Cognition: WNL  Sleep:  Fair   Screenings: PHQ2-9     Office Visit from 03/07/2017 in [facility] Office Visit from 06/04/2016 in [facility] Visit from 02/13/2016 in [facility]  PHQ-2 Total Score  0  1  0  PHQ-9 Total Score  -  6  9       Assessment and Plan: Mcarthur is a 43 year old Caucasian male, employed, married, lives in [place], has a history of depression, anxiety, diabetes, hypertension, hyperlipidemia, presented to the clinic today for follow-up visit.  This is patient's first visit with Probation officer.  Patient reports mood lability, irritability and depressive symptoms.  Pt also reports restlessness.  He does not think Wellbutrin is helpful.  Patient denies any suicidality.  Patient is currently in psychotherapy again. Pt meets criteria for bipolar disorder.  Discussed with patient the following plan.  Plan  Bipolar 2 disorder-unstable Increase Abilify to 3 mg p.o. daily. Reduce Wellbutrin to 100 mg p.o. daily in the morning Add Lamictal 25 mg p.o. daily.  For anxiety disorder-unstable Reduce Wellbutrin as noted above. Add Lamictal 25 mg p.o. daily.  Tobacco use disorder- unstable Provided smoking cessation counseling.  Patient filled out a mood disorder questionnaire and scored high-question #1- 9, question #2- yes, question #3-moderate problem.  Patient hence meets criteria for bipolar 2 diagnosis based on clinical evaluation and screening.  I have reviewed medical records in [facility] dated 10/24/2017 per Dr.  Ravi- as noted above at that time his  medications were readjusted to Wellbutrin 200 mg p.o. daily.  Discussed with patient to return to clinic in 2 to 3 weeks or sooner if needed.  I have spent atleast 25 minutes  face to face with patient today. More than 50 % of the time was spent for psychoeducation and supportive psychotherapy and care coordination.  This note was generated in part or whole with voice recognition software. Voice recognition is usually quite accurate but there are transcription errors that can and very often do occur. I apologize for any typographical errors that were not detected and corrected.            Ursula Alert, MD 02/18/2018, 5:54 PM

## 2018-03-04 ENCOUNTER — Ambulatory Visit (INDEPENDENT_AMBULATORY_CARE_PROVIDER_SITE_OTHER): Payer: 59 | Admitting: Psychiatry

## 2018-03-14 ENCOUNTER — Other Ambulatory Visit: Payer: Self-pay

## 2018-03-14 ENCOUNTER — Encounter: Payer: Self-pay | Admitting: Psychiatry

## 2018-03-14 ENCOUNTER — Ambulatory Visit (INDEPENDENT_AMBULATORY_CARE_PROVIDER_SITE_OTHER): Payer: 59 | Admitting: Psychiatry

## 2018-03-14 VITALS — BP 136/80 | HR 99 | Temp 98.1°F | Wt 214.2 lb

## 2018-03-14 DIAGNOSIS — F3181 Bipolar II disorder: Secondary | ICD-10-CM | POA: Diagnosis not present

## 2018-03-14 DIAGNOSIS — F17200 Nicotine dependence, unspecified, uncomplicated: Secondary | ICD-10-CM | POA: Diagnosis not present

## 2018-03-14 DIAGNOSIS — F411 Generalized anxiety disorder: Secondary | ICD-10-CM

## 2018-03-14 MED ORDER — LAMOTRIGINE 25 MG PO TABS: 50.0000 mg | ORAL_TABLET | Freq: Every day | ORAL | 1 refills | Status: DC

## 2018-03-14 NOTE — Progress Notes (Signed)
[facility] MD OP Progress Note  03/14/2018 12:46 PM Bernard [name]  MRN:  [id number]  Chief Complaint: ' I am here for follow up." Chief Complaint    Follow-up     HPI: Ernest is a 43 yr old Caucasian male, employed, lives in [place], has a history of bipolar disorder, anxiety disorder, presented to the clinic today for a follow-up visit.  Patient today returns for an appointment along with his wife Remo Lipps, who provided collateral information.  Patient reports he has been improving on the current medication regimen.  He reports he is currently only on Abilify and Lamictal.  He reports the medications has made a big difference in his mood symptoms.  He denies any significant sadness or mood lability.  He reports he continues to struggle with some anxiety symptoms on and off.  There was one recent episode when he felt extremely sad for a short period of time after his wife said something that he did not agree with.  He reports he was more aware of that and that helped him to snap out of it.  He has been working with his therapist on the same.  Patient reports he may have had possible redness or mild rash on his face initially however he does not know if it was due to him using a razor to shave which could also have triggered it.  He however reports it went away and did not stay.  He denies any side effects other than that to any of the medications.  Patient reports he had to stop the Wellbutrin because his pharmacy did not give it to him and reported it may have been due to having the wrong health insurance information with them.  He reports he went without it for more than 2 weeks and he did not miss it and it did not cause any problems even when he stopped it and he decided to stay off of it.  He reports he does not want to restart it anymore.  Patient denies any other concerns today. Visit Diagnosis:    ICD-10-CM   1. Bipolar 2 disorder (HCC) F31.81 lamoTRIgine (LAMICTAL) 25 MG tablet  2. GAD  (generalized anxiety disorder) F41.1   3. Tobacco use disorder F17.200     Past Psychiatric History: Reviewed past psychiatric history from my progress note on 02/18/2018.  Past trials of Abilify, risperidone, Wellbutrin.  Past Medical History:  Past Medical History:  Diagnosis Date  . Anxiety   . Depression   . Esophageal stricture   . Hyperlipidemia   . Hypertension     Past Surgical History:  Procedure Laterality Date  . PILONIDAL CYST EXCISION    . WISDOM TOOTH EXTRACTION      Family Psychiatric History: Reviewed family psychiatric history from my progress note on 02/18/2018.  Family History:  Family History  Problem Relation Age of Onset  . Cancer Mother   . Anxiety disorder Mother   . Depression Mother   . Cancer Father   . Anxiety disorder Brother   . Depression Brother     Social History: Reviewed social history from my progress note on 02/18/2018. Social History   Socioeconomic History  . Marital status: Married    Spouse name: joan   . Number of children: 2  . Years of education: Not on file  . Highest education level: Bachelor's degree (e.g., BA, AB, BS)  Occupational History  . Not on file  Social Needs  . Financial resource strain: Not  hard at all  . Food insecurity:    Worry: Never true    Inability: Never true  . Transportation needs:    Medical: No    Non-medical: No  Tobacco Use  . Smoking status: Current Every Day Smoker    Packs/day: 0.25    Types: Cigarettes    Start date: 01/22/2017  . Smokeless tobacco: Never Used  Substance and Sexual Activity  . Alcohol use: Yes    Alcohol/week: 2.0 - 6.0 standard drinks    Types: 2 - 6 Cans of beer per week    Comment: on occasion  . Drug use: No  . Sexual activity: Yes    Birth control/protection: None  Lifestyle  . Physical activity:    Days per week: 0 days    Minutes per session: 0 min  . Stress: Very much  Relationships  . Social connections:    Talks on phone: More than three times  a week    Gets together: Twice a week    Attends religious service: More than 4 times per year    Active member of club or organization: Yes    Attends meetings of clubs or organizations: More than 4 times per year    Relationship status: Married  Other Topics Concern  . Not on file  Social History Narrative  . Not on file    Allergies: No Known Allergies  Metabolic Disorder Labs: Lab Results  Component Value Date   HGBA1C 8.8 (H) 07/11/2015   No results found for: PROLACTIN Lab Results  Component Value Date   CHOL 130 03/07/2017   TRIG 167 (H) 03/07/2017   HDL 55 03/07/2017   CHOLHDL 2.4 03/07/2017   VLDL 13 08/06/2016   LDLCALC 42 03/07/2017   LDLCALC 58 02/13/2016   Lab Results  Component Value Date   TSH 2.240 03/07/2017   TSH 1.660 02/13/2016    Therapeutic Level Labs: No results found for: LITHIUM No results found for: VALPROATE No components found for:  CBMZ  Current Medications: Current Outpatient Medications  Medication Sig Dispense Refill  . amLODipine (NORVASC) 5 MG tablet Take 1 tablet (5 mg total) by mouth daily. 90 tablet 1  . ARIPiprazole (ABILIFY) 2 MG tablet Take 1.5 tablets (3 mg total) by mouth daily. 45 tablet 0  . atorvastatin (LIPITOR) 40 MG tablet Take 1 tablet (40 mg total) by mouth daily. 90 tablet 1  . Cetirizine HCl 10 MG CAPS Take by mouth.    . colesevelam (WELCHOL) 625 MG tablet Take by mouth.    . famotidine (PEPCID) 40 MG tablet Take 1 tablet (40 mg total) by mouth daily. 30 tablet 2  . glucose blood (PRECISION QID TEST) test strip Use 1 each (1 strip total) 4 (four) times daily Use as instructed.    . Insulin Glargine (LANTUS SOLOSTAR) 100 UNIT/ML Solostar Pen 50 units daily when off pump    . Insulin Glargine (LANTUS SOLOSTAR) 100 UNIT/ML Solostar Pen Inject into the skin.    [name] insulin lispro (HUMALOG) 100 UNIT/ML injection Use as directed in insulin pump. MDD: 90 units. E10.65    . insulin lispro (HUMALOG) 100 UNIT/ML injection  Up to 100 units daily in pump    . Insulin Pen Needle 29G X 12.7MM MISC 1 each by Does not apply route 3 (three) times daily. 100 each 12  . lamoTRIgine (LAMICTAL) 25 MG tablet Take 2 tablets (50 mg total) by mouth daily. 60 tablet 1  . ondansetron (ZOFRAN ODT)  4 MG disintegrating tablet Take 1 tablet (4 mg total) by mouth every 8 (eight) hours as needed for nausea or vomiting. 30 tablet 1  . pantoprazole (PROTONIX) 40 MG tablet Take 1 tablet (40 mg total) by mouth 2 (two) times daily. 180 tablet 1  . ranitidine (ZANTAC) 150 MG tablet Take 1 tablet (150 mg total) by mouth at bedtime. 90 tablet 1   No current facility-administered medications for this visit.      Musculoskeletal: Strength & Muscle Tone: within normal limits Gait & Station: normal Patient leans: N/A  Psychiatric Specialty Exam: Review of Systems  Psychiatric/Behavioral: The patient is nervous/anxious.   All other systems reviewed and are negative.   Blood pressure 136/80, pulse 99, temperature 98.1 F (36.7 C), temperature source Oral, weight 214 lb 3.2 oz (97.2 kg).Body mass index is 31.45 kg/m.  General Appearance: Casual  Eye Contact:  Fair  Speech:  Clear and Coherent  Volume:  Normal  Mood:  Anxious  Affect:  Congruent  Thought Process:  Goal Directed and Descriptions of Associations: Intact  Orientation:  Full (Time, Place, and Person)  Thought Content: Logical   Suicidal Thoughts:  No  Homicidal Thoughts:  No  Memory:  Immediate;   Fair Recent;   Fair Remote;   Fair  Judgement:  Fair  Insight:  Fair  Psychomotor Activity:  Normal  Concentration:  Concentration: Fair and Attention Span: Fair  Recall:  [employer] of Knowledge: Fair  Language: Fair  Akathisia:  No  Handed:  Right  AIMS (if indicated): denies tremors,rigidty,stiffness  Assets:  Communication Skills Desire for Improvement Social Support  ADL's:  Intact  Cognition: WNL  Sleep:  Fair   Screenings: PHQ2-9     Office Visit from  03/07/2017 in [facility] Office Visit from 06/04/2016 in [facility] Visit from 02/13/2016 in [facility]  PHQ-2 Total Score  0  1  0  PHQ-9 Total Score  -  6  9       Assessment and Plan: Bohden is a 43 year old Caucasian male, employed, married, lives in [place], has a history of depression, anxiety, diabetes, hypertension, hyperlipidemia, presented to the clinic today for a follow-up visit.  Patient returns for a follow-up visit along with his wife who provided collateral information.  Patient is currently making some progress with the current medication.  We will continue to make medication readjustment as noted below.  Plan Bipolar 2 disorder- improving Abilify 3 mg p.o. daily. Increase Lamictal to 50 mg p.o. daily Discontinue Wellbutrin for noncompliance.  Anxiety disorder- improving Continue CBT. Continue Abilify and Lamictal as discussed above.  For tobacco use disorder-unstable Provided smoking cessation counseling.  Collateral information was obtained from wife- Remo Lipps - " Per wife patient is improving on the current medication regimen.  He is usually not very forthcoming about his symptoms when he goes to doctor's office.  She reports he is currently making progress .'  Discussed GeneSight testing with patient-provided him information about the same today.  Follow  up in clinic in 4 weeks or sooner if needed.  I have spent atleast 15 minutes face to face with patient today. More than 50 % of the time was spent for psychoeducation and supportive psychotherapy and care coordination.  This note was generated in part or whole with voice recognition software. Voice recognition is usually quite accurate but there are transcription errors that can and very often do occur. I apologize for any typographical errors that  were not detected and corrected.      Ursula Alert, MD 03/14/2018, 12:46 PM

## 2018-03-24 ENCOUNTER — Other Ambulatory Visit: Payer: Self-pay | Admitting: Psychiatry

## 2018-03-24 DIAGNOSIS — F3181 Bipolar II disorder: Secondary | ICD-10-CM

## 2018-03-31 ENCOUNTER — Other Ambulatory Visit: Payer: Self-pay | Admitting: Psychiatry

## 2018-03-31 ENCOUNTER — Other Ambulatory Visit: Payer: Self-pay | Admitting: Family Medicine

## 2018-03-31 DIAGNOSIS — F3181 Bipolar II disorder: Secondary | ICD-10-CM

## 2018-03-31 NOTE — Telephone Encounter (Signed)
Called pt but no answer at this time. Left message for pt to return call to office to schedule appt.

## 2018-04-08 ENCOUNTER — Other Ambulatory Visit: Payer: Self-pay

## 2018-04-08 ENCOUNTER — Ambulatory Visit (INDEPENDENT_AMBULATORY_CARE_PROVIDER_SITE_OTHER): Payer: 59 | Admitting: Psychiatry

## 2018-04-08 ENCOUNTER — Encounter: Payer: Self-pay | Admitting: Psychiatry

## 2018-04-08 VITALS — BP 127/84 | HR 108 | Temp 98.9°F | Wt 208.0 lb

## 2018-04-08 DIAGNOSIS — F3181 Bipolar II disorder: Secondary | ICD-10-CM | POA: Diagnosis not present

## 2018-04-08 DIAGNOSIS — F17200 Nicotine dependence, unspecified, uncomplicated: Secondary | ICD-10-CM

## 2018-04-08 DIAGNOSIS — F411 Generalized anxiety disorder: Secondary | ICD-10-CM | POA: Diagnosis not present

## 2018-04-08 MED ORDER — ARIPIPRAZOLE 2 MG PO TABS: ORAL_TABLET | ORAL | 2 refills | Status: DC

## 2018-04-08 NOTE — Progress Notes (Signed)
[facility] MD OP Progress Note  04/08/2018 3:52 PM Bernard [name]  MRN:  [id number]  Chief Complaint: ' I am here for follow up." Chief Complaint    Follow-up; Medication Refill     HPI: Bernard [name] is a 43 year old Caucasian male, employed, lives in [place], has a history of bipolar disorder, anxiety disorder, presented to clinic today for a follow-up visit.  Patient today reports he is making a lot of progress on the current medication regimen.  He reports he does not have any significant mood swings on the medications at this time.  He feels more stable.  There are times when he feels depressed however he is able to distract himself and better able to cope than before.  He reports sleep is good.  He denies any suicidality, homicidality or perceptual disturbances.  Patient reports he continues to see his therapist Colleen Can in [place] on a regular basis.  Discussed with patient to sign a release to obtain medical records from his therapist.  Patient denies any other concerns today. Visit Diagnosis:    ICD-10-CM   1. Bipolar 2 disorder (HCC) F31.81 ARIPiprazole (ABILIFY) 2 MG tablet   mixed episodes  2. GAD (generalized anxiety disorder) F41.1   3. Tobacco use disorder F17.200     Past Psychiatric History: Have reviewed past psychiatric history from my progress note on 02/18/2018.  Past trials of Abilify, risperidone, Wellbutrin.  Past Medical History:  Past Medical History:  Diagnosis Date  . Anxiety   . Depression   . Esophageal stricture   . Hyperlipidemia   . Hypertension     Past Surgical History:  Procedure Laterality Date  . PILONIDAL CYST EXCISION    . WISDOM TOOTH EXTRACTION      Family Psychiatric History: I have reviewed family psychiatric history from my progress note on 02/18/2018  Family History:  Family History  Problem Relation Age of Onset  . Cancer Mother   . Anxiety disorder Mother   . Depression Mother   . Cancer Father   . Anxiety disorder  Brother   . Depression Brother     Social History: Reviewed social history from my progress note on 02/18/2018 Social History   Socioeconomic History  . Marital status: Married    Spouse name: joan   . Number of children: 2  . Years of education: Not on file  . Highest education level: Bachelor's degree (e.g., BA, AB, BS)  Occupational History  . Not on file  Social Needs  . Financial resource strain: Not hard at all  . Food insecurity:    Worry: Never true    Inability: Never true  . Transportation needs:    Medical: No    Non-medical: No  Tobacco Use  . Smoking status: Current Every Day Smoker    Packs/day: 0.25    Types: Cigarettes    Start date: 01/22/2017  . Smokeless tobacco: Never Used  Substance and Sexual Activity  . Alcohol use: Yes    Alcohol/week: 2.0 - 6.0 standard drinks    Types: 2 - 6 Cans of beer per week    Comment: on occasion  . Drug use: No  . Sexual activity: Yes    Birth control/protection: None  Lifestyle  . Physical activity:    Days per week: 0 days    Minutes per session: 0 min  . Stress: Very much  Relationships  . Social connections:    Talks on phone: More than three times a week  Gets together: Twice a week    Attends religious service: More than 4 times per year    Active member of club or organization: Yes    Attends meetings of clubs or organizations: More than 4 times per year    Relationship status: Married  Other Topics Concern  . Not on file  Social History Narrative  . Not on file    Allergies: No Known Allergies  Metabolic Disorder Labs: Lab Results  Component Value Date   HGBA1C 8.8 (H) 07/11/2015   No results found for: PROLACTIN Lab Results  Component Value Date   CHOL 130 03/07/2017   TRIG 167 (H) 03/07/2017   HDL 55 03/07/2017   CHOLHDL 2.4 03/07/2017   VLDL 13 08/06/2016   LDLCALC 42 03/07/2017   LDLCALC 58 02/13/2016   Lab Results  Component Value Date   TSH 2.240 03/07/2017   TSH 1.660  02/13/2016    Therapeutic Level Labs: No results found for: LITHIUM No results found for: VALPROATE No components found for:  CBMZ  Current Medications: Current Outpatient Medications  Medication Sig Dispense Refill  . amLODipine (NORVASC) 5 MG tablet Take 1 tablet (5 mg total) by mouth daily. 90 tablet 1  . ARIPiprazole (ABILIFY) 2 MG tablet TAKE 1.5 TABLETS BY MOUTH DAILY 45 tablet 2  . atorvastatin (LIPITOR) 40 MG tablet Take 1 tablet (40 mg total) by mouth daily. 90 tablet 1  . Cetirizine HCl 10 MG CAPS Take by mouth.    . colesevelam (WELCHOL) 625 MG tablet Take by mouth.    . famotidine (PEPCID) 40 MG tablet TAKE ONE TABLET BY MOUTH DAILY. 30 tablet 2  . glucose blood (PRECISION QID TEST) test strip Use 1 each (1 strip total) 4 (four) times daily Use as instructed.    . Insulin Glargine (LANTUS SOLOSTAR) 100 UNIT/ML Solostar Pen 50 units daily when off pump    . Insulin Glargine (LANTUS SOLOSTAR) 100 UNIT/ML Solostar Pen Inject into the skin.    [name] insulin lispro (HUMALOG) 100 UNIT/ML injection Use as directed in insulin pump. MDD: 90 units. E10.65    . insulin lispro (HUMALOG) 100 UNIT/ML injection Up to 100 units daily in pump    . Insulin Pen Needle 29G X 12.7MM MISC 1 each by Does not apply route 3 (three) times daily. 100 each 12  . lamoTRIgine (LAMICTAL) 25 MG tablet Take 2 tablets (50 mg total) by mouth daily. 60 tablet 1  . ondansetron (ZOFRAN ODT) 4 MG disintegrating tablet Take 1 tablet (4 mg total) by mouth every 8 (eight) hours as needed for nausea or vomiting. 30 tablet 1  . pantoprazole (PROTONIX) 40 MG tablet Take 1 tablet (40 mg total) by mouth 2 (two) times daily. 180 tablet 1  . ranitidine (ZANTAC) 150 MG tablet Take 1 tablet (150 mg total) by mouth at bedtime. 90 tablet 1   No current facility-administered medications for this visit.      Musculoskeletal: Strength & Muscle Tone: within normal limits Gait & Station: normal Patient leans: N/A  Psychiatric  Specialty Exam: Review of Systems  Psychiatric/Behavioral: The patient is nervous/anxious (improving).   All other systems reviewed and are negative.   Blood pressure 127/84, pulse (!) 108, temperature 98.9 F (37.2 C), temperature source Oral, weight 208 lb (94.3 kg).Body mass index is 30.54 kg/m.  General Appearance: Casual  Eye Contact:  Fair  Speech:  Normal Rate  Volume:  Normal  Mood:  Euthymic  Affect:  Appropriate  Thought  Process:  Goal Directed and Descriptions of Associations: Intact  Orientation:  Full (Time, Place, and Person)  Thought Content: Logical   Suicidal Thoughts:  No  Homicidal Thoughts:  No  Memory:  Immediate;   Fair Recent;   Fair Remote;   Fair  Judgement:  Fair  Insight:  Fair  Psychomotor Activity:  Normal  Concentration:  Concentration: Fair and Attention Span: Fair  Recall:  [employer] of Knowledge: Fair  Language: Fair  Akathisia:  No  Handed:  Right  AIMS (if indicated): Denies tremors, stiffness, rigidity  Assets:  Communication Skills Desire for Improvement Social Support  ADL's:  Intact  Cognition: WNL  Sleep:  Fair   Screenings: PHQ2-9     Office Visit from 03/07/2017 in [facility] Visit from 06/04/2016 in [facility] Visit from 02/13/2016 in [facility]  PHQ-2 Total Score  0  1  0  PHQ-9 Total Score  -  6  9       Assessment and Plan: Dannie is a 43 yr old male, employed, married, lives in [place], has a history of depression, anxiety, diabetes,, hypertension, hyperlipidemia, presented to clinic today for a follow-up visit.  Patient today reports making progress on the current medication regimen.  Will continue plan as noted below.  Plan Bipolar 2 disorder-improving Abilify 3 mg p.o. daily Lamotrigine 50 mg p.o. daily  For anxiety disorder-improving Continue CBT with Ms. Colleen Can in [place].  Discussed with patient to sign a release to obtain medical records from his  therapist. Continue Abilify and Lamictal.  For tobacco use disorder-unstable Provided smoking cessation counseling.    Pt was provided information for GeneSight testing.  Follow-up in clinic in 1 month or sooner if needed.  I have spent atleast 15 minutes face to face with patient today. More than 50 % of the time was spent for psychoeducation and supportive psychotherapy and care coordination.  This note was generated in part or whole with voice recognition software. Voice recognition is usually quite accurate but there are transcription errors that can and very often do occur. I apologize for any typographical errors that were not detected and corrected.        Ursula Alert, MD 04/08/2018, 3:52 PM

## 2018-04-17 ENCOUNTER — Other Ambulatory Visit: Payer: Self-pay

## 2018-04-17 ENCOUNTER — Encounter: Payer: Self-pay | Admitting: Family Medicine

## 2018-04-17 ENCOUNTER — Ambulatory Visit (INDEPENDENT_AMBULATORY_CARE_PROVIDER_SITE_OTHER): Payer: 59 | Admitting: Family Medicine

## 2018-04-17 VITALS — BP 110/74 | HR 97 | Temp 98.1°F | Ht 68.66 in | Wt 200.0 lb

## 2018-04-17 DIAGNOSIS — F339 Major depressive disorder, recurrent, unspecified: Secondary | ICD-10-CM | POA: Diagnosis not present

## 2018-04-17 DIAGNOSIS — M25512 Pain in left shoulder: Secondary | ICD-10-CM

## 2018-04-17 DIAGNOSIS — I10 Essential (primary) hypertension: Secondary | ICD-10-CM

## 2018-04-17 DIAGNOSIS — E7800 Pure hypercholesterolemia, unspecified: Secondary | ICD-10-CM

## 2018-04-17 DIAGNOSIS — Z23 Encounter for immunization: Secondary | ICD-10-CM

## 2018-04-17 DIAGNOSIS — E1069 Type 1 diabetes mellitus with other specified complication: Secondary | ICD-10-CM

## 2018-04-17 DIAGNOSIS — Z0000 Encounter for general adult medical examination without abnormal findings: Secondary | ICD-10-CM | POA: Diagnosis not present

## 2018-04-17 DIAGNOSIS — E109 Type 1 diabetes mellitus without complications: Secondary | ICD-10-CM | POA: Diagnosis not present

## 2018-04-17 DIAGNOSIS — M720 Palmar fascial fibromatosis [Dupuytren]: Secondary | ICD-10-CM | POA: Insufficient documentation

## 2018-04-17 DIAGNOSIS — E785 Hyperlipidemia, unspecified: Secondary | ICD-10-CM

## 2018-04-17 LAB — URINALYSIS, ROUTINE W REFLEX MICROSCOPIC
Bilirubin, UA: NEGATIVE
Glucose, UA: NEGATIVE
LEUKOCYTES UA: NEGATIVE
Nitrite, UA: NEGATIVE
PROTEIN UA: NEGATIVE
SPEC GRAV UA: 1.025 (ref 1.005–1.030)
Urobilinogen, Ur: 0.2 mg/dL (ref 0.2–1.0)
pH, UA: 5.5 (ref 5.0–7.5)

## 2018-04-17 LAB — MICROSCOPIC EXAMINATION
Bacteria, UA: NONE SEEN
RBC, UA: NONE SEEN /hpf (ref 0–2)
WBC, UA: NONE SEEN /hpf (ref 0–5)

## 2018-04-17 MED ORDER — ATORVASTATIN CALCIUM 40 MG PO TABS: 40.0000 mg | ORAL_TABLET | Freq: Every day | ORAL | 12 refills | Status: DC

## 2018-04-17 MED ORDER — RANITIDINE HCL 150 MG PO TABS: 150.0000 mg | ORAL_TABLET | Freq: Every day | ORAL | 12 refills | Status: DC

## 2018-04-17 MED ORDER — MELOXICAM 15 MG PO TABS: 15.0000 mg | ORAL_TABLET | Freq: Every day | ORAL | 3 refills | Status: DC

## 2018-04-17 MED ORDER — PANTOPRAZOLE SODIUM 40 MG PO TBEC: 40.0000 mg | DELAYED_RELEASE_TABLET | Freq: Two times a day (BID) | ORAL | 12 refills | Status: DC

## 2018-04-17 MED ORDER — AMLODIPINE BESYLATE 5 MG PO TABS: 5.0000 mg | ORAL_TABLET | Freq: Every day | ORAL | 12 refills | Status: DC

## 2018-04-17 NOTE — Assessment & Plan Note (Signed)
The current medical regimen is effective;  continue present plan and medications.  

## 2018-04-17 NOTE — Assessment & Plan Note (Signed)
Followed by psychiatry and stable

## 2018-04-17 NOTE — Progress Notes (Signed)
BP 110/74 (BP Location: Left Arm, Patient Position: Sitting, Cuff Size: Normal)   Pulse 97   Temp 98.1 F (36.7 C)   Ht 5' 8.66" (1.744 m)   Wt 200 lb (90.7 kg)   SpO2 97%   BMI 29.83 kg/m    Subjective:    Patient ID: Bernard [name], male    DOB: 04-28-1975, 43 y.o.   MRN: [id number]  HPI: Bernard [name] is a 43 y.o. male  Chief Complaint  Patient presents with  . Hypertension  . Hyperlipidemia  . Shoulder Pain    left  annual exam Patient all in all doing well with blood pressure cholesterol. Has had some changes in his right hand with some developing some contractures. Working with psychiatry and was placed on Lamictal which is done wonders. Biggest issue is developing some left shoulder pain sometimes at night will wake him especially if he lays on that side.  Was playing basketball when it seemed to develop.  Relevant past medical, surgical, family and social history reviewed and updated as indicated. Interim medical history since our last visit reviewed. Allergies and medications reviewed and updated.  Review of Systems  Constitutional: Negative.   HENT: Negative.   Eyes: Negative.   Respiratory: Negative.   Cardiovascular: Negative.   Gastrointestinal: Negative.   Endocrine: Negative.   Genitourinary: Negative.   Musculoskeletal: Negative.   Skin: Negative.   Allergic/Immunologic: Negative.   Neurological: Negative.   Hematological: Negative.   Psychiatric/Behavioral: Negative.     Per HPI unless specifically indicated above     Objective:    BP 110/74 (BP Location: Left Arm, Patient Position: Sitting, Cuff Size: Normal)   Pulse 97   Temp 98.1 F (36.7 C)   Ht 5' 8.66" (1.744 m)   Wt 200 lb (90.7 kg)   SpO2 97%   BMI 29.83 kg/m   Wt Readings from Last 3 Encounters:  04/17/18 200 lb (90.7 kg)  03/07/17 206 lb 1 oz (93.5 kg)  01/04/17 210 lb (95.3 kg)    Physical Exam Constitutional:      Appearance: He is well-developed.   HENT:     Head: Normocephalic and atraumatic.     Right Ear: External ear normal.     Left Ear: External ear normal.  Eyes:     Conjunctiva/sclera: Conjunctivae normal.     Pupils: Pupils are equal, round, and reactive to light.  Neck:     Musculoskeletal: Normal range of motion and neck supple.  Cardiovascular:     Rate and Rhythm: Normal rate and regular rhythm.     Heart sounds: Normal heart sounds.  Pulmonary:     Effort: Pulmonary effort is normal.     Breath sounds: Normal breath sounds.  Abdominal:     General: Bowel sounds are normal.     Palpations: Abdomen is soft. There is no hepatomegaly or splenomegaly.  Genitourinary:    Penis: Normal.      Prostate: Normal.     Rectum: Normal.  Musculoskeletal: Normal range of motion.  Skin:    Findings: No erythema or rash.  Neurological:     Mental Status: He is alert and oriented to person, place, and time.     Deep Tendon Reflexes: Reflexes are normal and symmetric.  Psychiatric:        Behavior: Behavior normal.        Thought Content: Thought content normal.        Judgment: Judgment normal.  Results for orders placed or performed in visit on 03/07/17  Microscopic Examination  Result Value Ref Range   WBC, UA 0-5 0 - 5 /hpf   RBC, UA 0-2 0 - 2 /hpf   Epithelial Cells (non renal) CANCELED    Bacteria, UA None seen None seen/Few  CBC with Differential/Platelet  Result Value Ref Range   WBC 8.7 3.4 - 10.8 x10E3/uL   RBC 4.75 4.14 - 5.80 x10E6/uL   Hemoglobin 14.6 13.0 - 17.7 g/dL   Hematocrit 42.0 37.5 - 51.0 %   MCV 88 79 - 97 fL   MCH 30.7 26.6 - 33.0 pg   MCHC 34.8 31.5 - 35.7 g/dL   RDW 13.4 12.3 - 15.4 %   Platelets 239 150 - 379 x10E3/uL   Neutrophils 61 Not Estab. %   Lymphs 23 Not Estab. %   Monocytes 6 Not Estab. %   Eos 9 Not Estab. %   Basos 1 Not Estab. %   Neutrophils Absolute 5.3 1.4 - 7.0 x10E3/uL   Lymphocytes Absolute 2.0 0.7 - 3.1 x10E3/uL   Monocytes Absolute 0.5 0.1 - 0.9 x10E3/uL    EOS (ABSOLUTE) 0.8 (H) 0.0 - 0.4 x10E3/uL   Basophils Absolute 0.1 0.0 - 0.2 x10E3/uL   Immature Granulocytes 0 Not Estab. %   Immature Grans (Abs) 0.0 0.0 - 0.1 x10E3/uL  Comprehensive metabolic panel  Result Value Ref Range   Glucose 201 (H) 65 - 99 mg/dL   BUN 8 6 - 24 mg/dL   Creatinine, Ser 0.77 0.76 - 1.27 mg/dL   GFR calc non Af Amer 114 >59 mL/min/1.73   GFR calc Af Amer 131 >59 mL/min/1.73   BUN/Creatinine Ratio 10 9 - 20   Sodium 141 134 - 144 mmol/L   Potassium 4.1 3.5 - 5.2 mmol/L   Chloride 102 96 - 106 mmol/L   CO2 23 20 - 29 mmol/L   Calcium 8.9 8.7 - 10.2 mg/dL   Total Protein 6.3 6.0 - 8.5 g/dL   Albumin 4.4 3.5 - 5.5 g/dL   Globulin, Total 1.9 1.5 - 4.5 g/dL   Albumin/Globulin Ratio 2.3 (H) 1.2 - 2.2   Bilirubin Total 0.4 0.0 - 1.2 mg/dL   Alkaline Phosphatase 65 39 - 117 IU/L   AST 18 0 - 40 IU/L   ALT 27 0 - 44 IU/L  Lipid panel  Result Value Ref Range   Cholesterol, Total 130 100 - 199 mg/dL   Triglycerides 167 (H) 0 - 149 mg/dL   HDL 55 >39 mg/dL   VLDL Cholesterol Cal 33 5 - 40 mg/dL   LDL Calculated 42 0 - 99 mg/dL   Chol/HDL Ratio 2.4 0.0 - 5.0 ratio  PSA  Result Value Ref Range   Prostate Specific Ag, Serum 0.3 0.0 - 4.0 ng/mL  TSH  Result Value Ref Range   TSH 2.240 0.450 - 4.500 uIU/mL  Urinalysis, Routine w reflex microscopic  Result Value Ref Range   Specific Gravity, UA 1.010 1.005 - 1.030   pH, UA 6.5 5.0 - 7.5   Color, UA Straw Yellow   Appearance Ur Clear Clear   Leukocytes, UA Negative Negative   Protein, UA Negative Negative/Trace   Glucose, UA Negative Negative   Ketones, UA Negative Negative   RBC, UA Trace (A) Negative   Bilirubin, UA Negative Negative   Urobilinogen, Ur 0.2 0.2 - 1.0 mg/dL   Nitrite, UA Negative Negative   Microscopic Examination See below:   Microalbumin, Urine Waived  Result Value Ref Range   Microalb, Ur Waived 10 0 - 19 mg/L   Creatinine, Urine Waived 50 10 - 300 mg/dL   Microalb/Creat Ratio <30  <30 mg/g  HIV antibody (with reflex)  Result Value Ref Range   HIV Screen 4th Generation wRfx Non Reactive Non Reactive      Assessment & Plan:   Problem List Items Addressed This Visit      Cardiovascular and Mediastinum   Hypertension    The current medical regimen is effective;  continue present plan and medications.       Relevant Medications   atorvastatin (LIPITOR) 40 MG tablet   amLODipine (NORVASC) 5 MG tablet     Endocrine   DM type 1 (diabetes mellitus, type 1) ([place])    Followed by endocrinology and doing well      Relevant Medications   atorvastatin (LIPITOR) 40 MG tablet   Hyperlipidemia due to type 1 diabetes mellitus (HCC)   Relevant Medications   atorvastatin (LIPITOR) 40 MG tablet   amLODipine (NORVASC) 5 MG tablet     Musculoskeletal and Integument   Dupuytren's contracture of right hand    Discussed Dupuytren's contracture care treatment and observation        Other   Depression, recurrent ([place])    Followed by psychiatry and stable      Hyperlipidemia    The current medical regimen is effective;  continue present plan and medications.       Relevant Medications   atorvastatin (LIPITOR) 40 MG tablet   amLODipine (NORVASC) 5 MG tablet   Shoulder pain, left    Discussed shoulder pain care and treatment will start with meloxicam       Other Visit Diagnoses    Immunization due    -  Primary   Relevant Orders   Tdap vaccine greater than or equal to 7yo IM (Completed)   Pneumococcal polysaccharide vaccine 23-valent greater than or equal to 2yo subcutaneous/IM (Completed)   PE (physical exam), annual       Relevant Orders   Comprehensive metabolic panel   Lipid panel   CBC with Differential/Platelet   TSH   Urinalysis, Routine w reflex microscopic       Follow up plan: Return in about 6 months (around 10/18/2018) for BMP,  Lipids, ALT, AST.

## 2018-04-17 NOTE — Assessment & Plan Note (Signed)
Followed by endocrinology and doing well

## 2018-04-17 NOTE — Assessment & Plan Note (Signed)
Discussed shoulder pain care and treatment will start with meloxicam

## 2018-04-17 NOTE — Assessment & Plan Note (Signed)
Discussed Dupuytren's contracture care treatment and observation

## 2018-04-18 ENCOUNTER — Encounter: Payer: Self-pay | Admitting: Family Medicine

## 2018-04-18 LAB — LIPID PANEL
CHOLESTEROL TOTAL: 170 mg/dL (ref 100–199)
Chol/HDL Ratio: 3.3 ratio (ref 0.0–5.0)
HDL: 51 mg/dL (ref >39)
LDL Calculated: 102 mg/dL — ABNORMAL HIGH (ref 0–99)
Triglycerides: 87 mg/dL (ref 0–149)
VLDL Cholesterol Cal: 17 mg/dL (ref 5–40)

## 2018-04-18 LAB — COMPREHENSIVE METABOLIC PANEL
ALK PHOS: 72 IU/L (ref 39–117)
ALT: 25 IU/L (ref 0–44)
AST: 18 IU/L (ref 0–40)
Albumin/Globulin Ratio: 2.6 — ABNORMAL HIGH (ref 1.2–2.2)
Albumin: 4.7 g/dL (ref 4.0–5.0)
BUN/Creatinine Ratio: 10 (ref 9–20)
BUN: 10 mg/dL (ref 6–24)
Bilirubin Total: 0.6 mg/dL (ref 0.0–1.2)
CO2: 22 mmol/L (ref 20–29)
Calcium: 9.6 mg/dL (ref 8.7–10.2)
Chloride: 101 mmol/L (ref 96–106)
Creatinine, Ser: 1 mg/dL (ref 0.76–1.27)
GFR calc Af Amer: 107 mL/min/{1.73_m2} (ref >59)
GFR calc non Af Amer: 92 mL/min/{1.73_m2} (ref >59)
Globulin, Total: 1.8 g/dL (ref 1.5–4.5)
Glucose: 134 mg/dL — ABNORMAL HIGH (ref 65–99)
Potassium: 4.3 mmol/L (ref 3.5–5.2)
Sodium: 142 mmol/L (ref 134–144)
Total Protein: 6.5 g/dL (ref 6.0–8.5)

## 2018-04-18 LAB — CBC WITH DIFFERENTIAL/PLATELET
Basophils Absolute: 0.1 10*3/uL (ref 0.0–0.2)
Basos: 1 %
EOS (ABSOLUTE): 0.3 10*3/uL (ref 0.0–0.4)
Eos: 6 %
Hematocrit: 44.1 % (ref 37.5–51.0)
Hemoglobin: 15.4 g/dL (ref 13.0–17.7)
Immature Grans (Abs): 0 10*3/uL (ref 0.0–0.1)
Immature Granulocytes: 1 %
LYMPHS ABS: 1.4 10*3/uL (ref 0.7–3.1)
Lymphs: 24 %
MCH: 30.4 pg (ref 26.6–33.0)
MCHC: 34.9 g/dL (ref 31.5–35.7)
MCV: 87 fL (ref 79–97)
Monocytes Absolute: 0.4 10*3/uL (ref 0.1–0.9)
Monocytes: 8 %
Neutrophils Absolute: 3.4 10*3/uL (ref 1.4–7.0)
Neutrophils: 60 %
Platelets: 252 10*3/uL (ref 150–450)
RBC: 5.06 x10E6/uL (ref 4.14–5.80)
RDW: 13 % (ref 11.6–15.4)
WBC: 5.6 10*3/uL (ref 3.4–10.8)

## 2018-04-18 LAB — TSH: TSH: 2.18 u[IU]/mL (ref 0.450–4.500)

## 2018-04-28 ENCOUNTER — Other Ambulatory Visit: Payer: Self-pay | Admitting: Psychiatry

## 2018-04-28 DIAGNOSIS — F3181 Bipolar II disorder: Secondary | ICD-10-CM

## 2018-05-05 ENCOUNTER — Encounter: Payer: Self-pay | Admitting: Family Medicine

## 2018-05-06 ENCOUNTER — Encounter: Payer: Self-pay | Admitting: Family Medicine

## 2018-05-06 ENCOUNTER — Ambulatory Visit (INDEPENDENT_AMBULATORY_CARE_PROVIDER_SITE_OTHER): Payer: 59 | Admitting: Family Medicine

## 2018-05-06 ENCOUNTER — Other Ambulatory Visit: Payer: Self-pay

## 2018-05-06 DIAGNOSIS — F339 Major depressive disorder, recurrent, unspecified: Secondary | ICD-10-CM | POA: Diagnosis not present

## 2018-05-06 DIAGNOSIS — M25512 Pain in left shoulder: Secondary | ICD-10-CM

## 2018-05-06 DIAGNOSIS — E109 Type 1 diabetes mellitus without complications: Secondary | ICD-10-CM | POA: Diagnosis not present

## 2018-05-06 NOTE — Assessment & Plan Note (Signed)
The current medical regimen is effective;  continue present plan and medications.  

## 2018-05-06 NOTE — Progress Notes (Addendum)
There were no vitals taken for this visit.   Subjective:    Patient ID: Bernard [name], male    DOB: 04-17-1975, 43 y.o.   MRN: [id number]  HPI: Bernard [name] is a 43 y.o. male   Telemedicine using audio and telecommunications for a synchronous communication visit. Today's visit due to COVID-19 isolation precautions I connected with Dorian Furnace and verified that I am speaking with the correct person using two identifiers.   I discussed the limitations, risks, security and privacy concerns of performing an evaluation and management service by telephone and the availability of in person appointments. I also discussed with the patient that there may be a patient responsible charge related to this service. The patient expressed understanding and agreed to proceed. The patient's location is home. I am at home.  Discussed rotator cuff most likely possibility of patient's left shoulder pain.  Discussed risk and benefit of having a steroid shot.  Especially concerned because of patient at high risk for COVID-19 exposure and serious complications because of his type 1 diabetes. Discussed care and treatment of shoulder otherwise.  Patient able to minimize pain by minimize shoulder movement can work at home and do typing keeping his elbow by his side. Meloxicam Tylenol seem to help some. Diabetes seems to be stable. Nerves seem to be doing stable  Relevant past medical, surgical, family and social history reviewed and updated as indicated. Interim medical history since our last visit reviewed. Allergies and medications reviewed and updated.  Review of Systems  Constitutional: Negative.   Respiratory: Negative.   Cardiovascular: Negative.     Per HPI unless specifically indicated above     Objective:    There were no vitals taken for this visit.  Wt Readings from Last 3 Encounters:  04/17/18 200 lb (90.7 kg)  03/07/17 206 lb 1 oz (93.5 kg)  01/04/17 210 lb (95.3  kg)    Physical Exam  none Results for orders placed or performed in visit on 04/17/18  Microscopic Examination  Result Value Ref Range   WBC, UA None seen 0 - 5 /hpf   RBC, UA None seen 0 - 2 /hpf   Epithelial Cells (non renal) 0-10 0 - 10 /hpf   Mucus, UA Present Not Estab.   Bacteria, UA None seen None seen/Few  Comprehensive metabolic panel  Result Value Ref Range   Glucose 134 (H) 65 - 99 mg/dL   BUN 10 6 - 24 mg/dL   Creatinine, Ser 1.00 0.76 - 1.27 mg/dL   GFR calc non Af Amer 92 >59 mL/min/1.73   GFR calc Af Amer 107 >59 mL/min/1.73   BUN/Creatinine Ratio 10 9 - 20   Sodium 142 134 - 144 mmol/L   Potassium 4.3 3.5 - 5.2 mmol/L   Chloride 101 96 - 106 mmol/L   CO2 22 20 - 29 mmol/L   Calcium 9.6 8.7 - 10.2 mg/dL   Total Protein 6.5 6.0 - 8.5 g/dL   Albumin 4.7 4.0 - 5.0 g/dL   Globulin, Total 1.8 1.5 - 4.5 g/dL   Albumin/Globulin Ratio 2.6 (H) 1.2 - 2.2   Bilirubin Total 0.6 0.0 - 1.2 mg/dL   Alkaline Phosphatase 72 39 - 117 IU/L   AST 18 0 - 40 IU/L   ALT 25 0 - 44 IU/L  Lipid panel  Result Value Ref Range   Cholesterol, Total 170 100 - 199 mg/dL   Triglycerides 87 0 - 149 mg/dL   HDL 51 >39  mg/dL   VLDL Cholesterol Cal 17 5 - 40 mg/dL   LDL Calculated 102 (H) 0 - 99 mg/dL   Chol/HDL Ratio 3.3 0.0 - 5.0 ratio  CBC with Differential/Platelet  Result Value Ref Range   WBC 5.6 3.4 - 10.8 x10E3/uL   RBC 5.06 4.14 - 5.80 x10E6/uL   Hemoglobin 15.4 13.0 - 17.7 g/dL   Hematocrit 44.1 37.5 - 51.0 %   MCV 87 79 - 97 fL   MCH 30.4 26.6 - 33.0 pg   MCHC 34.9 31.5 - 35.7 g/dL   RDW 13.0 11.6 - 15.4 %   Platelets 252 150 - 450 x10E3/uL   Neutrophils 60 Not Estab. %   Lymphs 24 Not Estab. %   Monocytes 8 Not Estab. %   Eos 6 Not Estab. %   Basos 1 Not Estab. %   Neutrophils Absolute 3.4 1.4 - 7.0 x10E3/uL   Lymphocytes Absolute 1.4 0.7 - 3.1 x10E3/uL   Monocytes Absolute 0.4 0.1 - 0.9 x10E3/uL   EOS (ABSOLUTE) 0.3 0.0 - 0.4 x10E3/uL   Basophils Absolute 0.1 0.0  - 0.2 x10E3/uL   Immature Granulocytes 1 Not Estab. %   Immature Grans (Abs) 0.0 0.0 - 0.1 x10E3/uL  TSH  Result Value Ref Range   TSH 2.180 0.450 - 4.500 uIU/mL  Urinalysis, Routine w reflex microscopic  Result Value Ref Range   Specific Gravity, UA 1.025 1.005 - 1.030   pH, UA 5.5 5.0 - 7.5   Color, UA Yellow Yellow   Appearance Ur Clear Clear   Leukocytes, UA Negative Negative   Protein, UA Negative Negative/Trace   Glucose, UA Negative Negative   Ketones, UA 2+ (A) Negative   RBC, UA Trace (A) Negative   Bilirubin, UA Negative Negative   Urobilinogen, Ur 0.2 0.2 - 1.0 mg/dL   Nitrite, UA Negative Negative   Microscopic Examination See below:       Assessment & Plan:   Problem List Items Addressed This Visit      Endocrine   DM type 1 (diabetes mellitus, type 1) (HCC)    The current medical regimen is effective;  continue present plan and medications.         Other   Depression, recurrent ([place])    The current medical regimen is effective;  continue present plan and medications.       Shoulder pain, left    Most likely torn rotator cuff will maintain with Tylenol and meloxicam for now we will go ahead and make referral with orthopedics in May. Discussed COVID-19 cautions and restrictions      Relevant Orders   Ambulatory referral to Orthopedic Surgery     I discussed the assessment and treatment plan with the patient. The patient was provided an opportunity to ask questions and all were answered. The patient agreed with the plan and demonstrated an understanding of the instructions.   The patient was advised to call back or seek an in-person evaluation if the symptoms worsen or if the condition fails to improve as anticipated.   I provided 21+ minutes of time during this encounter.  Follow up plan: Return if symptoms worsen or fail to improve, for As scheduled.

## 2018-05-06 NOTE — Assessment & Plan Note (Signed)
Most likely torn rotator cuff will maintain with Tylenol and meloxicam for now we will go ahead and make referral with orthopedics in May. Discussed COVID-19 cautions and restrictions

## 2018-05-08 DIAGNOSIS — M7542 Impingement syndrome of left shoulder: Secondary | ICD-10-CM | POA: Diagnosis not present

## 2018-05-09 ENCOUNTER — Ambulatory Visit: Payer: 59 | Admitting: Psychiatry

## 2018-05-12 ENCOUNTER — Other Ambulatory Visit: Payer: Self-pay | Admitting: Psychiatry

## 2018-05-12 DIAGNOSIS — E109 Type 1 diabetes mellitus without complications: Secondary | ICD-10-CM | POA: Diagnosis not present

## 2018-05-12 DIAGNOSIS — M7542 Impingement syndrome of left shoulder: Secondary | ICD-10-CM | POA: Diagnosis not present

## 2018-05-12 DIAGNOSIS — F3181 Bipolar II disorder: Secondary | ICD-10-CM

## 2018-05-19 DIAGNOSIS — M25612 Stiffness of left shoulder, not elsewhere classified: Secondary | ICD-10-CM | POA: Diagnosis not present

## 2018-05-19 DIAGNOSIS — M25512 Pain in left shoulder: Secondary | ICD-10-CM | POA: Diagnosis not present

## 2018-05-23 DIAGNOSIS — M25512 Pain in left shoulder: Secondary | ICD-10-CM | POA: Diagnosis not present

## 2018-05-23 DIAGNOSIS — M25612 Stiffness of left shoulder, not elsewhere classified: Secondary | ICD-10-CM | POA: Diagnosis not present

## 2018-05-29 ENCOUNTER — Ambulatory Visit (INDEPENDENT_AMBULATORY_CARE_PROVIDER_SITE_OTHER): Payer: 59 | Admitting: Psychiatry

## 2018-05-29 ENCOUNTER — Encounter: Payer: Self-pay | Admitting: Psychiatry

## 2018-05-29 ENCOUNTER — Other Ambulatory Visit: Payer: Self-pay

## 2018-05-29 DIAGNOSIS — F3181 Bipolar II disorder: Secondary | ICD-10-CM | POA: Diagnosis not present

## 2018-05-29 DIAGNOSIS — F17200 Nicotine dependence, unspecified, uncomplicated: Secondary | ICD-10-CM | POA: Diagnosis not present

## 2018-05-29 DIAGNOSIS — F411 Generalized anxiety disorder: Secondary | ICD-10-CM | POA: Diagnosis not present

## 2018-05-29 NOTE — Progress Notes (Signed)
Virtual Visit via Telephone Note  I connected with [name] on 05/29/18 at 10:00 AM EDT by telephone and verified that I am speaking with the correct person using two identifiers.   I discussed the limitations, risks, security and privacy concerns of performing an evaluation and management service by telephone and the availability of in person appointments. I also discussed with the patient that there may be a patient responsible charge related to this service. The patient expressed understanding and agreed to proceed.    I discussed the assessment and treatment plan with the patient. The patient was provided an opportunity to ask questions and all were answered. The patient agreed with the plan and demonstrated an understanding of the instructions.   The patient was advised to call back or seek an in-person evaluation if the symptoms worsen or if the condition fails to improve as anticipated.  [facility] MD OP Progress Note  05/29/2018 1:09 PM Henrick Lourdes [name]  MRN:  [id number]  Chief Complaint:  Chief Complaint    Follow-up     HPI: Bernard [name] is a 43 year old Caucasian male, employed, lives in [place], has a history of bipolar disorder, anxiety disorder, tobacco use disorder was evaluated by phone today.  Patient reports he is coping okay with the COVID-19 outbreak.  He reports he has no difficulty staying indoor mostly.  He has been spending time with his family and enjoys it.  He reports he is compliant with his medications as prescribed.  He takes Abilify and lamotrigine.  He is doing well on the current medication regimen.  He denies any ups and downs in his mood.  He denies any significant anxiety symptoms.  He reports sleep and appetite is good.  He continues to see his therapist Colleen Can in [place] every 2 weeks.  He reports therapy is going well.  He denies any other concerns today. Visit Diagnosis:    ICD-10-CM   1. Bipolar 2 disorder (HCC) F31.81    mixed  episodes  2. GAD (generalized anxiety disorder) F41.1   3. Tobacco use disorder F17.200     Past Psychiatric History: Have reviewed past psychiatric history from my progress note on 02/18/2018.  Past trials of Abilify, risperidone, Wellbutrin.  Past Medical History:  Past Medical History:  Diagnosis Date  . Anxiety   . Depression   . Esophageal stricture   . Hyperlipidemia   . Hypertension   . Weight loss     Past Surgical History:  Procedure Laterality Date  . PILONIDAL CYST EXCISION    . WISDOM TOOTH EXTRACTION      Family Psychiatric History: Reviewed family psychiatric history from my progress note on 02/18/2018.  Family History:  Family History  Problem Relation Age of Onset  . Cancer Mother   . Anxiety disorder Mother   . Depression Mother   . Cancer Father   . Anxiety disorder Brother   . Depression Brother     Social History: Reviewed social history from my progress note on 02/18/2018. Social History   Socioeconomic History  . Marital status: Married    Spouse name: joan   . Number of children: 2  . Years of education: Not on file  . Highest education level: Bachelor's degree (e.g., BA, AB, BS)  Occupational History  . Not on file  Social Needs  . Financial resource strain: Not hard at all  . Food insecurity:    Worry: Never true    Inability: Never true  . Transportation  needs:    Medical: No    Non-medical: No  Tobacco Use  . Smoking status: Former Smoker    Packs/day: 0.25    Types: Cigarettes    Start date: 01/22/2017  . Smokeless tobacco: Never Used  Substance and Sexual Activity  . Alcohol use: Yes    Alcohol/week: 2.0 - 6.0 standard drinks    Types: 2 - 6 Cans of beer per week    Comment: on occasion  . Drug use: No  . Sexual activity: Yes    Birth control/protection: None  Lifestyle  . Physical activity:    Days per week: 0 days    Minutes per session: 0 min  . Stress: Very much  Relationships  . Social connections:    Talks on  phone: More than three times a week    Gets together: Twice a week    Attends religious service: More than 4 times per year    Active member of club or organization: Yes    Attends meetings of clubs or organizations: More than 4 times per year    Relationship status: Married  Other Topics Concern  . Not on file  Social History Narrative  . Not on file    Allergies: No Known Allergies  Metabolic Disorder Labs: Lab Results  Component Value Date   HGBA1C 8.8 (H) 07/11/2015   No results found for: PROLACTIN Lab Results  Component Value Date   CHOL 170 04/17/2018   TRIG 87 04/17/2018   HDL 51 04/17/2018   CHOLHDL 3.3 04/17/2018   VLDL 13 08/06/2016   LDLCALC 102 (H) 04/17/2018   LDLCALC 42 03/07/2017   Lab Results  Component Value Date   TSH 2.180 04/17/2018   TSH 2.240 03/07/2017    Therapeutic Level Labs: No results found for: LITHIUM No results found for: VALPROATE No components found for:  CBMZ  Current Medications: Current Outpatient Medications  Medication Sig Dispense Refill  . amLODipine (NORVASC) 5 MG tablet Take 1 tablet (5 mg total) by mouth daily. 30 tablet 12  . ARIPiprazole (ABILIFY) 2 MG tablet TAKE 1 AND 1/2 TABLETS BY MOUTH DAILY. 45 tablet 2  . atorvastatin (LIPITOR) 40 MG tablet Take 1 tablet (40 mg total) by mouth daily. 30 tablet 12  . Cetirizine HCl 10 MG CAPS Take by mouth.    [name] glucose blood (PRECISION QID TEST) test strip Use 1 each (1 strip total) 4 (four) times daily Use as instructed.    . Insulin Glargine (LANTUS SOLOSTAR) 100 UNIT/ML Solostar Pen 50 units daily when off pump    . insulin lispro (HUMALOG) 100 UNIT/ML injection Use as directed in insulin pump. MDD: 90 units. E10.65    . Insulin Pen Needle 29G X 12.7MM MISC 1 each by Does not apply route 3 (three) times daily. 100 each 12  . lamoTRIgine (LAMICTAL) 25 MG tablet TAKE (2) TABLETS BY MOUTH EVERY DAY 60 tablet 1  . meloxicam (MOBIC) 15 MG tablet Take 1 tablet (15 mg total) by  mouth daily. 30 tablet 3  . pantoprazole (PROTONIX) 40 MG tablet Take 1 tablet (40 mg total) by mouth 2 (two) times daily. 60 tablet 12  . ranitidine (ZANTAC) 150 MG tablet Take 1 tablet (150 mg total) by mouth at bedtime. 30 tablet 12   No current facility-administered medications for this visit.      Musculoskeletal: Strength & Muscle Tone: UTA Gait & Station: UTA Patient leans: UTA  Psychiatric Specialty Exam: Review of Systems  Psychiatric/Behavioral: The patient is not nervous/anxious.   All other systems reviewed and are negative.   There were no vitals taken for this visit.There is no height or weight on file to calculate BMI.  General Appearance: UTA  Eye Contact:  UTA  Speech:  Normal Rate  Volume:  Normal  Mood:  Euthymic  Affect:  UTA  Thought Process:  Goal Directed and Descriptions of Associations: Intact  Orientation:  Full (Time, Place, and Person)  Thought Content: Logical   Suicidal Thoughts:  No  Homicidal Thoughts:  No  Memory:  Immediate;   Fair Recent;   Fair Remote;   Fair  Judgement:  Fair  Insight:  Fair  Psychomotor Activity:  UTA  Concentration:  Concentration: Fair and Attention Span: Fair  Recall:  [employer] of Knowledge: Fair  Language: Fair  Akathisia:  No  Handed:  Right  AIMS (if indicated): UTA  Assets:  Communication Skills Desire for Improvement Social Support  ADL's:  Intact  Cognition: WNL  Sleep:  Fair   Screenings: PHQ2-9     Office Visit from 04/17/2018 in [facility] Visit from 03/07/2017 in [facility] Visit from 06/04/2016 in [facility] Office Visit from 02/13/2016 in [facility]  PHQ-2 Total Score  0  0  1  0  PHQ-9 Total Score  -  -  6  9       Assessment and Plan: Lynwood is a 43 year old male, employed, married, lives in [place], has a history of depression, anxiety, diabetes, hypertension, hyperlipidemia was evaluated by phone today.  Patient is  currently making progress on the current medication regimen.  Plan as noted below.  Plan Bipolar 2 disorder-improving Abilify 3 mg p.o. daily Lamotrigine 50 mg p.o. daily  For anxiety disorder-improving Continue CBT with Ms. Colleen Can in [place].     Follow-up in clinic in 6 to 8 weeks or sooner if needed.  I have spent atleast 15 minutes non face to face with patient today. More than 50 % of the time was spent for psychoeducation and supportive psychotherapy and care coordination.  This note was generated in part or whole with voice recognition software. Voice recognition is usually quite accurate but there are transcription errors that can and very often do occur. I apologize for any typographical errors that were not detected and corrected.      Ursula Alert, MD 05/29/2018, 1:09 PM

## 2018-05-29 NOTE — Progress Notes (Signed)
05/29/18 9:50 Spoke with patient. Reviewed medical history, allergies. Medication and pharmacy was reviewed and updated. No vitals taken due to a phone visit.

## 2018-05-30 DIAGNOSIS — M25612 Stiffness of left shoulder, not elsewhere classified: Secondary | ICD-10-CM | POA: Diagnosis not present

## 2018-05-30 DIAGNOSIS — M25512 Pain in left shoulder: Secondary | ICD-10-CM | POA: Diagnosis not present

## 2018-06-06 DIAGNOSIS — M25612 Stiffness of left shoulder, not elsewhere classified: Secondary | ICD-10-CM | POA: Diagnosis not present

## 2018-06-06 DIAGNOSIS — M25512 Pain in left shoulder: Secondary | ICD-10-CM | POA: Diagnosis not present

## 2018-06-11 DIAGNOSIS — M25612 Stiffness of left shoulder, not elsewhere classified: Secondary | ICD-10-CM | POA: Diagnosis not present

## 2018-06-11 DIAGNOSIS — M25512 Pain in left shoulder: Secondary | ICD-10-CM | POA: Diagnosis not present

## 2018-06-16 DIAGNOSIS — E1069 Type 1 diabetes mellitus with other specified complication: Secondary | ICD-10-CM | POA: Diagnosis not present

## 2018-06-16 DIAGNOSIS — E1159 Type 2 diabetes mellitus with other circulatory complications: Secondary | ICD-10-CM | POA: Diagnosis not present

## 2018-06-16 DIAGNOSIS — E109 Type 1 diabetes mellitus without complications: Secondary | ICD-10-CM | POA: Diagnosis not present

## 2018-06-18 DIAGNOSIS — M25512 Pain in left shoulder: Secondary | ICD-10-CM | POA: Diagnosis not present

## 2018-06-18 DIAGNOSIS — M25612 Stiffness of left shoulder, not elsewhere classified: Secondary | ICD-10-CM | POA: Diagnosis not present

## 2018-06-23 DIAGNOSIS — M7502 Adhesive capsulitis of left shoulder: Secondary | ICD-10-CM | POA: Diagnosis not present

## 2018-07-14 ENCOUNTER — Other Ambulatory Visit: Payer: Self-pay | Admitting: Psychiatry

## 2018-07-14 DIAGNOSIS — F3181 Bipolar II disorder: Secondary | ICD-10-CM

## 2018-07-21 NOTE — Telephone Encounter (Signed)
Pt is no longer on medication

## 2018-07-24 ENCOUNTER — Ambulatory Visit (INDEPENDENT_AMBULATORY_CARE_PROVIDER_SITE_OTHER): Payer: 59 | Admitting: Psychiatry

## 2018-07-24 ENCOUNTER — Encounter: Payer: Self-pay | Admitting: Psychiatry

## 2018-07-24 ENCOUNTER — Other Ambulatory Visit: Payer: Self-pay

## 2018-07-24 DIAGNOSIS — F411 Generalized anxiety disorder: Secondary | ICD-10-CM | POA: Diagnosis not present

## 2018-07-24 DIAGNOSIS — F3181 Bipolar II disorder: Secondary | ICD-10-CM

## 2018-07-24 DIAGNOSIS — F17200 Nicotine dependence, unspecified, uncomplicated: Secondary | ICD-10-CM

## 2018-07-24 MED ORDER — ARIPIPRAZOLE 2 MG PO TABS: ORAL_TABLET | ORAL | 2 refills | Status: DC

## 2018-07-24 NOTE — Progress Notes (Signed)
Virtual Visit via Video Note  I connected with Bernard [name] on 07/24/18 at 10:15 AM EDT by a video enabled telemedicine application and verified that I am speaking with the correct person using two identifiers.   I discussed the limitations of evaluation and management by telemedicine and the availability of in person appointments. The patient expressed understanding and agreed to proceed.    I discussed the assessment and treatment plan with the patient. The patient was provided an opportunity to ask questions and all were answered. The patient agreed with the plan and demonstrated an understanding of the instructions.   The patient was advised to call back or seek an in-person evaluation if the symptoms worsen or if the condition fails to improve as anticipated.   [facility] MD/PA/NP OP Progress Note  07/24/2018 6:40 PM Bernard [name]  MRN:  [id number]  Chief Complaint:  Chief Complaint    Follow-up     HPI: Bernard [name] is a 43 year old Caucasian male, employed, lives in [place], has a history of bipolar disorder, tobacco use disorder, anxiety disorder was evaluated by telemedicine today.  Patient reports he continues to work remotely from home.  Starting next week he has to go back to the office at least once every 2 weeks.  He reports work continues to be stressful.  He however reports his mood symptoms are better and he is able to cope with his stressors better than before.  Patient reports sleep is good.  He continues to be compliant with medications as prescribed.  He denies any side effects to the medications.  He denies any other concerns today. Visit Diagnosis:    ICD-10-CM   1. Bipolar 2 disorder (HCC)  F31.81 ARIPiprazole (ABILIFY) 2 MG tablet   mixed episodes  2. GAD (generalized anxiety disorder)  F41.1   3. Tobacco use disorder  F17.200     Past Psychiatric History: Reviewed past psychiatric history from my progress note on 02/18/2018.  Past trials of Abilify,  risperidone, Wellbutrin.  Past Medical History:  Past Medical History:  Diagnosis Date  . Anxiety   . Depression   . Esophageal stricture   . Hyperlipidemia   . Hypertension   . Weight loss     Past Surgical History:  Procedure Laterality Date  . PILONIDAL CYST EXCISION    . WISDOM TOOTH EXTRACTION      Family Psychiatric History: Reviewed family psychiatric history from my progress note on 02/18/2018. Family History:  Family History  Problem Relation Age of Onset  . Cancer Mother   . Anxiety disorder Mother   . Depression Mother   . Cancer Father   . Anxiety disorder Brother   . Depression Brother     Social History: Reviewed social history from my progress note on 02/18/2018. Social History   Socioeconomic History  . Marital status: Married    Spouse name: joan   . Number of children: 2  . Years of education: Not on file  . Highest education level: Bachelor's degree (e.g., BA, AB, BS)  Occupational History  . Not on file  Social Needs  . Financial resource strain: Not hard at all  . Food insecurity    Worry: Never true    Inability: Never true  . Transportation needs    Medical: No    Non-medical: No  Tobacco Use  . Smoking status: Former Smoker    Packs/day: 0.25    Types: Cigarettes    Start date: 01/22/2017  . Smokeless tobacco: Never  Used  Substance and Sexual Activity  . Alcohol use: Yes    Alcohol/week: 2.0 - 6.0 standard drinks    Types: 2 - 6 Cans of beer per week    Comment: on occasion  . Drug use: No  . Sexual activity: Yes    Birth control/protection: None  Lifestyle  . Physical activity    Days per week: 0 days    Minutes per session: 0 min  . Stress: Very much  Relationships  . Social connections    Talks on phone: More than three times a week    Gets together: Twice a week    Attends religious service: More than 4 times per year    Active member of club or organization: Yes    Attends meetings of clubs or organizations: More  than 4 times per year    Relationship status: Married  Other Topics Concern  . Not on file  Social History Narrative  . Not on file    Allergies: No Known Allergies  Metabolic Disorder Labs: Lab Results  Component Value Date   HGBA1C 8.8 (H) 07/11/2015   No results found for: PROLACTIN Lab Results  Component Value Date   CHOL 170 04/17/2018   TRIG 87 04/17/2018   HDL 51 04/17/2018   CHOLHDL 3.3 04/17/2018   VLDL 13 08/06/2016   LDLCALC 102 (H) 04/17/2018   LDLCALC 42 03/07/2017   Lab Results  Component Value Date   TSH 2.180 04/17/2018   TSH 2.240 03/07/2017    Therapeutic Level Labs: No results found for: LITHIUM No results found for: VALPROATE No components found for:  CBMZ  Current Medications: Current Outpatient Medications  Medication Sig Dispense Refill  . amLODipine (NORVASC) 5 MG tablet Take 1 tablet (5 mg total) by mouth daily. 30 tablet 12  . ARIPiprazole (ABILIFY) 2 MG tablet TAKE 1 AND 1/2 TABLETS BY MOUTH DAILY. 45 tablet 2  . atorvastatin (LIPITOR) 40 MG tablet Take 1 tablet (40 mg total) by mouth daily. 30 tablet 12  . Cetirizine HCl 10 MG CAPS Take by mouth.    [name] glucose blood (PRECISION QID TEST) test strip Use 1 each (1 strip total) 4 (four) times daily Use as instructed.    . Insulin Glargine (LANTUS SOLOSTAR) 100 UNIT/ML Solostar Pen 50 units daily when off pump    . insulin lispro (HUMALOG) 100 UNIT/ML injection Use as directed in insulin pump. MDD: 90 units. E10.65    . Insulin Pen Needle 29G X 12.7MM MISC 1 each by Does not apply route 3 (three) times daily. 100 each 12  . lamoTRIgine (LAMICTAL) 25 MG tablet TAKE (2) TABLETS BY MOUTH EVERY DAY 60 tablet 1  . meloxicam (MOBIC) 15 MG tablet Take 1 tablet (15 mg total) by mouth daily. 30 tablet 3  . pantoprazole (PROTONIX) 40 MG tablet Take 1 tablet (40 mg total) by mouth 2 (two) times daily. 60 tablet 12  . ranitidine (ZANTAC) 150 MG tablet Take 1 tablet (150 mg total) by mouth at bedtime. 30  tablet 12   No current facility-administered medications for this visit.      Musculoskeletal: Strength & Muscle Tone: within normal limits Gait & Station: normal Patient leans: N/A  Psychiatric Specialty Exam: Review of Systems  Psychiatric/Behavioral: The patient is not nervous/anxious.   All other systems reviewed and are negative.   There were no vitals taken for this visit.There is no height or weight on file to calculate BMI.  General Appearance: Casual  Eye Contact:  Fair  Speech:  Clear and Coherent  Volume:  Normal  Mood:  Euthymic  Affect:  Congruent  Thought Process:  Goal Directed and Descriptions of Associations: Intact  Orientation:  Full (Time, Place, and Person)  Thought Content: Logical   Suicidal Thoughts:  No  Homicidal Thoughts:  No  Memory:  Immediate;   Fair Recent;   Fair Remote;   Fair  Judgement:  Fair  Insight:  Fair  Psychomotor Activity:  Normal  Concentration:  Concentration: Fair and Attention Span: Fair  Recall:  [employer] of Knowledge: Fair  Language: Fair  Akathisia:  No  Handed:  Right  AIMS (if indicated): denies tremors, rigidity  Assets:  Communication Skills Desire for Improvement Social Support  ADL's:  Intact  Cognition: WNL  Sleep:  Fair   Screenings: PHQ2-9     Office Visit from 04/17/2018 in [facility] Visit from 03/07/2017 in [facility] Visit from 06/04/2016 in [facility] Visit from 02/13/2016 in [facility]  PHQ-2 Total Score  0  0  1  0  PHQ-9 Total Score  -  -  6  9       Assessment and Plan: Bernard [name] is a 43 year old male, employed, married, lives in [place], has a history of bipolar disorder, anxiety disorder, diabetes, hypertension, hyperlipidemia was evaluated by telemedicine today.  Patient is currently making progress on the current medication regimen.  Plan as noted below.  Plan Bipolar disorder-stable Abilify 3 mg p.o.  daily Lamotrigine 50 mg p.o. daily  For GAD-stable Continue CBT with Ms. Colleen Can in [place]  Follow-up in clinic in 2 months or sooner if needed.  September 3 at 2 PM  I have spent atleast 15 minutes non face to face with patient today. More than 50 % of the time was spent for psychoeducation and supportive psychotherapy and care coordination.  This note was generated in part or whole with voice recognition software. Voice recognition is usually quite accurate but there are transcription errors that can and very often do occur. I apologize for any typographical errors that were not detected and corrected.        Ursula Alert, MD 07/24/2018, 6:40 PM

## 2018-09-02 LAB — HM DIABETES EYE EXAM

## 2018-09-08 ENCOUNTER — Other Ambulatory Visit: Payer: Self-pay | Admitting: Psychiatry

## 2018-09-08 DIAGNOSIS — F3181 Bipolar II disorder: Secondary | ICD-10-CM

## 2018-09-15 DIAGNOSIS — E10319 Type 1 diabetes mellitus with unspecified diabetic retinopathy without macular edema: Secondary | ICD-10-CM | POA: Insufficient documentation

## 2018-09-16 ENCOUNTER — Other Ambulatory Visit: Payer: Self-pay

## 2018-09-16 MED ORDER — MELOXICAM 15 MG PO TABS: 15.0000 mg | ORAL_TABLET | Freq: Every day | ORAL | 3 refills | Status: DC

## 2018-09-16 NOTE — Telephone Encounter (Signed)
Patient last seen 05/06/18 and has f/up 10/20/18.

## 2018-10-06 ENCOUNTER — Other Ambulatory Visit: Payer: Self-pay | Admitting: Psychiatry

## 2018-10-06 DIAGNOSIS — F3181 Bipolar II disorder: Secondary | ICD-10-CM

## 2018-10-09 ENCOUNTER — Ambulatory Visit (INDEPENDENT_AMBULATORY_CARE_PROVIDER_SITE_OTHER): Payer: 59 | Admitting: Psychiatry

## 2018-10-09 ENCOUNTER — Other Ambulatory Visit: Payer: Self-pay

## 2018-10-09 ENCOUNTER — Encounter: Payer: Self-pay | Admitting: Psychiatry

## 2018-10-09 DIAGNOSIS — F3172 Bipolar disorder, in full remission, most recent episode hypomanic: Secondary | ICD-10-CM | POA: Diagnosis not present

## 2018-10-09 DIAGNOSIS — F17200 Nicotine dependence, unspecified, uncomplicated: Secondary | ICD-10-CM | POA: Diagnosis not present

## 2018-10-09 DIAGNOSIS — F411 Generalized anxiety disorder: Secondary | ICD-10-CM

## 2018-10-09 DIAGNOSIS — F3181 Bipolar II disorder: Secondary | ICD-10-CM | POA: Insufficient documentation

## 2018-10-09 MED ORDER — ARIPIPRAZOLE 2 MG PO TABS: ORAL_TABLET | ORAL | 3 refills | Status: DC

## 2018-10-09 NOTE — Progress Notes (Signed)
Virtual Visit via Video Note  I connected with Bernard [name] on 10/09/18 at  2:00 PM EDT by a video enabled telemedicine application and verified that I am speaking with the correct person using two identifiers.   I discussed the limitations of evaluation and management by telemedicine and the availability of in person appointments. The patient expressed understanding and agreed to proceed.  I discussed the assessment and treatment plan with the patient. The patient was provided an opportunity to ask questions and all were answered. The patient agreed with the plan and demonstrated an understanding of the instructions.   The patient was advised to call back or seek an in-person evaluation if the symptoms worsen or if the condition fails to improve as anticipated.   [facility] MD OP Progress Note  10/09/2018 5:31 PM Bernard [name]  MRN:  [id number]  Chief Complaint:  Chief Complaint    Follow-up     HPI: Bernard [name] is a 43 year old Caucasian male, employed, lives in [place], has a history of bipolar disorder, tobacco use disorder, anxiety disorder was evaluated by telemedicine today.  Patient today reports he is currently doing well with regards to his mood.  He does have some mood lability however he is able to cope with it.  The medications are effective.  He is compliant on them.  He denies any side effects.  Patient reports sleep is good.  Patient denies any suicidality, homicidality or perceptual disturbances.  Patient continues to work with his therapist and therapy is effective.  Patient denies any other concerns today. Visit Diagnosis:    ICD-10-CM   1. Bipolar disorder, in full remission, most recent episode hypomanic ([place])  F31.72 ARIPiprazole (ABILIFY) 2 MG tablet   mixed episodes  2. GAD (generalized anxiety disorder)  F41.1   3. Tobacco use disorder  F17.200     Past Psychiatric History: I have reviewed past psychiatric history from my progress note on 02/18/2018.   Past trials of Abilify, risperidone, Wellbutrin  Past Medical History:  Past Medical History:  Diagnosis Date  . Anxiety   . Depression   . Esophageal stricture   . Hyperlipidemia   . Hypertension   . Weight loss     Past Surgical History:  Procedure Laterality Date  . PILONIDAL CYST EXCISION    . WISDOM TOOTH EXTRACTION      Family Psychiatric History: I have reviewed family psychiatric history from my progress note on 02/18/2018  Family History:  Family History  Problem Relation Age of Onset  . Cancer Mother   . Anxiety disorder Mother   . Depression Mother   . Cancer Father   . Anxiety disorder Brother   . Depression Brother     Social History: I have reviewed social history from my progress note on 02/18/2018 Social History   Socioeconomic History  . Marital status: Married    Spouse name: joan   . Number of children: 2  . Years of education: Not on file  . Highest education level: Bachelor's degree (e.g., BA, AB, BS)  Occupational History  . Not on file  Social Needs  . Financial resource strain: Not hard at all  . Food insecurity    Worry: Never true    Inability: Never true  . Transportation needs    Medical: No    Non-medical: No  Tobacco Use  . Smoking status: Former Smoker    Packs/day: 0.25    Types: Cigarettes    Start date: 01/22/2017  .  Smokeless tobacco: Never Used  Substance and Sexual Activity  . Alcohol use: Yes    Alcohol/week: 2.0 - 6.0 standard drinks    Types: 2 - 6 Cans of beer per week    Comment: on occasion  . Drug use: No  . Sexual activity: Yes    Birth control/protection: None  Lifestyle  . Physical activity    Days per week: 0 days    Minutes per session: 0 min  . Stress: Very much  Relationships  . Social connections    Talks on phone: More than three times a week    Gets together: Twice a week    Attends religious service: More than 4 times per year    Active member of club or organization: Yes    Attends  meetings of clubs or organizations: More than 4 times per year    Relationship status: Married  Other Topics Concern  . Not on file  Social History Narrative  . Not on file    Allergies: No Known Allergies  Metabolic Disorder Labs: Lab Results  Component Value Date   HGBA1C 8.8 (H) 07/11/2015   No results found for: PROLACTIN Lab Results  Component Value Date   CHOL 170 04/17/2018   TRIG 87 04/17/2018   HDL 51 04/17/2018   CHOLHDL 3.3 04/17/2018   VLDL 13 08/06/2016   LDLCALC 102 (H) 04/17/2018   LDLCALC 42 03/07/2017   Lab Results  Component Value Date   TSH 2.180 04/17/2018   TSH 2.240 03/07/2017    Therapeutic Level Labs: No results found for: LITHIUM No results found for: VALPROATE No components found for:  CBMZ  Current Medications: Current Outpatient Medications  Medication Sig Dispense Refill  . amLODipine (NORVASC) 5 MG tablet Take 1 tablet (5 mg total) by mouth daily. 30 tablet 12  . ARIPiprazole (ABILIFY) 2 MG tablet TAKE 1 AND 1/2 TABLETS BY MOUTH DAILY. 45 tablet 3  . atorvastatin (LIPITOR) 40 MG tablet Take 1 tablet (40 mg total) by mouth daily. 30 tablet 12  . Cetirizine HCl 10 MG CAPS Take by mouth.    [name] glucose blood (PRECISION QID TEST) test strip Use 1 each (1 strip total) 4 (four) times daily Use as instructed.    . Insulin Glargine (LANTUS SOLOSTAR) 100 UNIT/ML Solostar Pen 50 units daily when off pump    . insulin lispro (HUMALOG) 100 UNIT/ML injection Use as directed in insulin pump. MDD: 90 units. E10.65    . Insulin Pen Needle 29G X 12.7MM MISC 1 each by Does not apply route 3 (three) times daily. 100 each 12  . lamoTRIgine (LAMICTAL) 25 MG tablet TAKE (2) TABLETS BY MOUTH EVERY DAY 60 tablet 1  . meloxicam (MOBIC) 15 MG tablet Take 1 tablet (15 mg total) by mouth daily. 30 tablet 3  . pantoprazole (PROTONIX) 40 MG tablet Take 1 tablet (40 mg total) by mouth 2 (two) times daily. 60 tablet 12  . ranitidine (ZANTAC) 150 MG tablet Take 1 tablet  (150 mg total) by mouth at bedtime. 30 tablet 12   No current facility-administered medications for this visit.      Musculoskeletal: Strength & Muscle Tone: UTA Gait & Station: normal Patient leans: N/A  Psychiatric Specialty Exam: Review of Systems  Psychiatric/Behavioral: The patient is not nervous/anxious.   All other systems reviewed and are negative.   There were no vitals taken for this visit.There is no height or weight on file to calculate BMI.  General Appearance:  Casual  Eye Contact:  Fair  Speech:  Clear and Coherent  Volume:  Normal  Mood:  Euthymic  Affect:  Congruent  Thought Process:  Goal Directed and Descriptions of Associations: Intact  Orientation:  Full (Time, Place, and Person)  Thought Content: Logical   Suicidal Thoughts:  No  Homicidal Thoughts:  No  Memory:  Immediate;   Fair Recent;   Fair Remote;   Fair  Judgement:  Fair  Insight:  Fair  Psychomotor Activity:  Normal  Concentration:  Concentration: Fair and Attention Span: Fair  Recall:  [employer] of Knowledge: Fair  Language: Fair  Akathisia:  No  Handed:  Right  AIMS (if indicated): Denies tremors, rigidity  Assets:  Communication Skills Desire for Improvement Social Support  ADL's:  Intact  Cognition: WNL  Sleep:  Fair   Screenings: PHQ2-9     Office Visit from 04/17/2018 in [facility] Visit from 03/07/2017 in [facility] Visit from 06/04/2016 in [facility] Visit from 02/13/2016 in [facility]  PHQ-2 Total Score  0  0  1  0  PHQ-9 Total Score  -  -  6  9       Assessment and Plan: Jefferey is a 43 year old male, employed, married, lives in [place], has a history of bipolar disorder, anxiety disorder, diabetes, hypertension, hyperlipidemia was evaluated by telemedicine today.  Patient is currently doing well on the current medication regimen.  Plan Bipolar disorder in remission Abilify 3 mg p.o.  daily Lamotrigine 50 mg p.o. daily  GAD-stable Continue CBT with Ms. Colleen Can.  Follow-up in clinic in 3 months or sooner if needed.  December 2 at 4 PM  I have spent atleast 15 minutes non face to face with patient today. More than 50 % of the time was spent for psychoeducation and supportive psychotherapy and care coordination. This note was generated in part or whole with voice recognition software. Voice recognition is usually quite accurate but there are transcription errors that can and very often do occur. I apologize for any typographical errors that were not detected and corrected.       Ursula Alert, MD 10/09/2018, 5:31 PM

## 2018-10-20 ENCOUNTER — Ambulatory Visit: Payer: 59 | Admitting: Family Medicine

## 2018-10-20 ENCOUNTER — Other Ambulatory Visit: Payer: Self-pay

## 2018-10-27 ENCOUNTER — Encounter: Payer: Self-pay | Admitting: Family Medicine

## 2018-10-27 ENCOUNTER — Ambulatory Visit (INDEPENDENT_AMBULATORY_CARE_PROVIDER_SITE_OTHER): Payer: 59 | Admitting: Family Medicine

## 2018-10-27 ENCOUNTER — Other Ambulatory Visit: Payer: Self-pay

## 2018-10-27 DIAGNOSIS — I10 Essential (primary) hypertension: Secondary | ICD-10-CM

## 2018-10-27 DIAGNOSIS — E109 Type 1 diabetes mellitus without complications: Secondary | ICD-10-CM | POA: Diagnosis not present

## 2018-10-27 DIAGNOSIS — E7800 Pure hypercholesterolemia, unspecified: Secondary | ICD-10-CM | POA: Diagnosis not present

## 2018-10-27 DIAGNOSIS — F3181 Bipolar II disorder: Secondary | ICD-10-CM | POA: Diagnosis not present

## 2018-10-27 DIAGNOSIS — K219 Gastro-esophageal reflux disease without esophagitis: Secondary | ICD-10-CM

## 2018-10-27 MED ORDER — DEXLANSOPRAZOLE 60 MG PO CPDR: 60.0000 mg | DELAYED_RELEASE_CAPSULE | Freq: Every day | ORAL | 3 refills | Status: DC

## 2018-10-27 NOTE — Assessment & Plan Note (Signed)
The current medical regimen is effective;  continue present plan and medications.  

## 2018-10-27 NOTE — Assessment & Plan Note (Signed)
Followed by psychiatry and stable

## 2018-10-27 NOTE — Progress Notes (Addendum)
There were no vitals taken for this visit.   Subjective:    Patient ID: Bernard [name], male    DOB: 1975-06-21, 43 y.o.   MRN: [id number]  HPI: Bernard [name] is a 43 y.o. male  Med check Discussed with patient all in all doing well with no complaints good reports from psychiatry and endocrine with good control of bipolar and diabetes. Blood pressure cholesterol also doing well with no complaints. Patient's reflux is been well controlled for years with his bed propped up Zantac and Protonix.  Lately though his reflux is gotten somewhat worse and is bothersome at night.  Relevant past medical, surgical, family and social history reviewed and updated as indicated. Interim medical history since our last visit reviewed. Allergies and medications reviewed and updated.  Review of Systems  Constitutional: Negative.   Respiratory: Negative.   Cardiovascular: Negative.     Per HPI unless specifically indicated above     Objective:    There were no vitals taken for this visit.  Wt Readings from Last 3 Encounters:  04/17/18 200 lb (90.7 kg)  03/07/17 206 lb 1 oz (93.5 kg)  01/04/17 210 lb (95.3 kg)    Physical Exam  Results for orders placed or performed in visit on 04/17/18  Microscopic Examination   URINE  Result Value Ref Range   WBC, UA None seen 0 - 5 /hpf   RBC, UA None seen 0 - 2 /hpf   Epithelial Cells (non renal) 0-10 0 - 10 /hpf   Mucus, UA Present Not Estab.   Bacteria, UA None seen None seen/Few  Comprehensive metabolic panel  Result Value Ref Range   Glucose 134 (H) 65 - 99 mg/dL   BUN 10 6 - 24 mg/dL   Creatinine, Ser 1.00 0.76 - 1.27 mg/dL   GFR calc non Af Amer 92 >59 mL/min/1.73   GFR calc Af Amer 107 >59 mL/min/1.73   BUN/Creatinine Ratio 10 9 - 20   Sodium 142 134 - 144 mmol/L   Potassium 4.3 3.5 - 5.2 mmol/L   Chloride 101 96 - 106 mmol/L   CO2 22 20 - 29 mmol/L   Calcium 9.6 8.7 - 10.2 mg/dL   Total Protein 6.5 6.0 - 8.5 g/dL   Albumin 4.7 4.0 - 5.0 g/dL   Globulin, Total 1.8 1.5 - 4.5 g/dL   Albumin/Globulin Ratio 2.6 (H) 1.2 - 2.2   Bilirubin Total 0.6 0.0 - 1.2 mg/dL   Alkaline Phosphatase 72 39 - 117 IU/L   AST 18 0 - 40 IU/L   ALT 25 0 - 44 IU/L  Lipid panel  Result Value Ref Range   Cholesterol, Total 170 100 - 199 mg/dL   Triglycerides 87 0 - 149 mg/dL   HDL 51 >39 mg/dL   VLDL Cholesterol Cal 17 5 - 40 mg/dL   LDL Calculated 102 (H) 0 - 99 mg/dL   Chol/HDL Ratio 3.3 0.0 - 5.0 ratio  CBC with Differential/Platelet  Result Value Ref Range   WBC 5.6 3.4 - 10.8 x10E3/uL   RBC 5.06 4.14 - 5.80 x10E6/uL   Hemoglobin 15.4 13.0 - 17.7 g/dL   Hematocrit 44.1 37.5 - 51.0 %   MCV 87 79 - 97 fL   MCH 30.4 26.6 - 33.0 pg   MCHC 34.9 31.5 - 35.7 g/dL   RDW 13.0 11.6 - 15.4 %   Platelets 252 150 - 450 x10E3/uL   Neutrophils 60 Not Estab. %   Lymphs 24  Not Estab. %   Monocytes 8 Not Estab. %   Eos 6 Not Estab. %   Basos 1 Not Estab. %   Neutrophils Absolute 3.4 1.4 - 7.0 x10E3/uL   Lymphocytes Absolute 1.4 0.7 - 3.1 x10E3/uL   Monocytes Absolute 0.4 0.1 - 0.9 x10E3/uL   EOS (ABSOLUTE) 0.3 0.0 - 0.4 x10E3/uL   Basophils Absolute 0.1 0.0 - 0.2 x10E3/uL   Immature Granulocytes 1 Not Estab. %   Immature Grans (Abs) 0.0 0.0 - 0.1 x10E3/uL  TSH  Result Value Ref Range   TSH 2.180 0.450 - 4.500 uIU/mL  Urinalysis, Routine w reflex microscopic  Result Value Ref Range   Specific Gravity, UA 1.025 1.005 - 1.030   pH, UA 5.5 5.0 - 7.5   Color, UA Yellow Yellow   Appearance Ur Clear Clear   Leukocytes, UA Negative Negative   Protein, UA Negative Negative/Trace   Glucose, UA Negative Negative   Ketones, UA 2+ (A) Negative   RBC, UA Trace (A) Negative   Bilirubin, UA Negative Negative   Urobilinogen, Ur 0.2 0.2 - 1.0 mg/dL   Nitrite, UA Negative Negative   Microscopic Examination See below:       Assessment & Plan:   Problem List Items Addressed This Visit      Cardiovascular and Mediastinum    Hypertension    The current medical regimen is effective;  continue present plan and medications.         Digestive   Acid reflux    With worsening will try Dexilant and consider GI referral if no response        Endocrine   DM type 1 (diabetes mellitus, type 1) ([place])    Followed by endocrine and stable        Other   Hyperlipidemia    The current medical regimen is effective;  continue present plan and medications.       Bipolar 2 disorder ([place])    Followed by psychiatry and stable          Telemedicine using audio/video telecommunications for a synchronous communication visit. Today's visit due to COVID-19 isolation precautions I connected with and verified that I am speaking with the correct person using two identifiers.   I discussed the limitations, risks, security and privacy concerns of performing an evaluation and management service by telecommunication and the availability of in person appointments. I also discussed with the patient that there may be a patient responsible charge related to this service. The patient expressed understanding and agreed to proceed. The patient's location is home. I am at home.   I discussed the assessment and treatment plan with the patient. The patient was provided an opportunity to ask questions and all were answered. The patient agreed with the plan and demonstrated an understanding of the instructions.   The patient was advised to call back or seek an in-person evaluation if the symptoms worsen or if the condition fails to improve as anticipated.   I provided 21+ minutes of time during this encounter. Follow up plan: Return in about 6 months (around 04/26/2019) for Physical Exam.

## 2018-10-27 NOTE — Assessment & Plan Note (Signed)
Followed by endocrine and stable

## 2018-10-27 NOTE — Assessment & Plan Note (Signed)
With worsening will try Dexilant and consider GI referral if no response

## 2018-11-24 ENCOUNTER — Other Ambulatory Visit: Payer: Self-pay

## 2018-11-24 ENCOUNTER — Ambulatory Visit (INDEPENDENT_AMBULATORY_CARE_PROVIDER_SITE_OTHER): Payer: 59 | Admitting: Family Medicine

## 2018-11-24 ENCOUNTER — Encounter: Payer: Self-pay | Admitting: Family Medicine

## 2018-11-24 VITALS — Temp 98.4°F

## 2018-11-24 DIAGNOSIS — Z20828 Contact with and (suspected) exposure to other viral communicable diseases: Secondary | ICD-10-CM | POA: Diagnosis not present

## 2018-11-24 DIAGNOSIS — Z20822 Contact with and (suspected) exposure to covid-19: Secondary | ICD-10-CM

## 2018-11-24 NOTE — Progress Notes (Signed)
Temp 98.4 F (36.9 C)    Subjective:    Patient ID: Bernard [name], male    DOB: 1975/04/18, 43 y.o.   MRN: [id number]  HPI: Bernard [name] is a 43 y.o. male  Chief Complaint  Patient presents with  . 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 This visit was completed via WebEx due to the restrictions of the COVID-19 pandemic. All issues as above were discussed and addressed. Physical exam was done as above through visual confirmation on WebEx. If it was felt that the patient should be evaluated in the office, they were directed there. The patient verbally consented to this visit. . Location of the patient: home . Location of the provider: work . Those involved with this call:  . Provider: Merrie Roof, PA-C . CMA: Tiffany Reel, CMA . Front Desk/Registration: Jill Side  . Time spent on call: 15 minutes with patient face to face via video conference. More than 50% of this time was spent in counseling and coordination of care. 5 minutes total spent in review of patient's record and preparation of their chart. I verified patient identity using two factors (patient name and date of birth). Patient consents verbally to being seen via telemedicine visit today.   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 Children are asymptomatic, and he has not felt anything out of the ordinary. Wanting guidance on what is appropriate in this situation. Has already consulted Pediatrician regarding the children. Denies fever, chills, cough, congestion, N/V/D.   Relevant past medical, surgical, family and social history reviewed and updated as indicated. Interim medical history since our last visit reviewed. Allergies and medications reviewed and updated.  Review of Systems   Per HPI unless specifically indicated above     Objective:    Temp 98.4 F (36.9 C)   Wt Readings from Last 3 Encounters:  04/17/18 200 lb (90.7 kg)  03/07/17 206 lb 1 oz (93.5 kg)  01/04/17 210 lb (95.3 kg)    Physical Exam Vitals signs and nursing note reviewed.  Constitutional:      General: He is not in acute distress.    Appearance: Normal appearance.  HENT:     Head: Atraumatic.     Right Ear: External ear normal.     Left Ear: External ear normal.     Nose: Nose normal. No congestion.     Mouth/Throat:     Mouth: Mucous membranes are moist.     Pharynx: Oropharynx is clear.  Eyes:     Extraocular Movements: Extraocular movements intact.     Conjunctiva/sclera: Conjunctivae normal.  Neck:     Musculoskeletal: Normal range of motion.  Pulmonary:     Effort: Pulmonary effort is normal. No respiratory distress.  Musculoskeletal: Normal range of motion.  Skin:    General: Skin is dry.     Findings: No erythema or rash.  Neurological:     Mental Status: He is oriented to person, place, and time.  Psychiatric:        Mood and Affect: Mood normal.        Thought Content: Thought content normal.        Judgment: Judgment normal.     Results for orders placed or  performed in visit on 04/17/18  Microscopic Examination   URINE  Result Value Ref Range   WBC, UA None seen 0 - 5 /hpf   RBC, UA None seen 0 - 2 /hpf   Epithelial Cells (non renal) 0-10 0 - 10 /hpf   Mucus, UA Present Not Estab.   Bacteria, UA None seen None seen/Few  Comprehensive metabolic panel  Result Value Ref Range   Glucose 134 (H) 65 - 99 mg/dL   BUN 10 6 - 24 mg/dL   Creatinine, Ser 1.00 0.76 - 1.27 mg/dL   GFR calc non Af Amer 92 >59 mL/min/1.73   GFR calc Af Amer 107 >59 mL/min/1.73   BUN/Creatinine Ratio 10 9 - 20   Sodium 142 134 - 144 mmol/L   Potassium 4.3 3.5 - 5.2 mmol/L   Chloride 101 96 - 106 mmol/L   CO2 22 20 - 29 mmol/L   Calcium 9.6 8.7 - 10.2 mg/dL   Total Protein 6.5  6.0 - 8.5 g/dL   Albumin 4.7 4.0 - 5.0 g/dL   Globulin, Total 1.8 1.5 - 4.5 g/dL   Albumin/Globulin Ratio 2.6 (H) 1.2 - 2.2   Bilirubin Total 0.6 0.0 - 1.2 mg/dL   Alkaline Phosphatase 72 39 - 117 IU/L   AST 18 0 - 40 IU/L   ALT 25 0 - 44 IU/L  Lipid panel  Result Value Ref Range   Cholesterol, Total 170 100 - 199 mg/dL   Triglycerides 87 0 - 149 mg/dL   HDL 51 >39 mg/dL   VLDL Cholesterol Cal 17 5 - 40 mg/dL   LDL Calculated 102 (H) 0 - 99 mg/dL   Chol/HDL Ratio 3.3 0.0 - 5.0 ratio  CBC with Differential/Platelet  Result Value Ref Range   WBC 5.6 3.4 - 10.8 x10E3/uL   RBC 5.06 4.14 - 5.80 x10E6/uL   Hemoglobin 15.4 13.0 - 17.7 g/dL   Hematocrit 44.1 37.5 - 51.0 %   MCV 87 79 - 97 fL   MCH 30.4 26.6 - 33.0 pg   MCHC 34.9 31.5 - 35.7 g/dL   RDW 13.0 11.6 - 15.4 %   Platelets 252 150 - 450 x10E3/uL   Neutrophils 60 Not Estab. %   Lymphs 24 Not Estab. %   Monocytes 8 Not Estab. %   Eos 6 Not Estab. %   Basos 1 Not Estab. %   Neutrophils Absolute 3.4 1.4 - 7.0 x10E3/uL   Lymphocytes Absolute 1.4 0.7 - 3.1 x10E3/uL   Monocytes Absolute 0.4 0.1 - 0.9 x10E3/uL   EOS (ABSOLUTE) 0.3 0.0 - 0.4 x10E3/uL   Basophils Absolute 0.1 0.0 - 0.2 x10E3/uL   Immature Granulocytes 1 Not Estab. %   Immature Grans (Abs) 0.0 0.0 - 0.1 x10E3/uL  TSH  Result Value Ref Range   TSH 2.180 0.450 - 4.500 uIU/mL  Urinalysis, Routine w reflex microscopic  Result Value Ref Range   Specific Gravity, UA 1.025 1.005 - 1.030   pH, UA 5.5 5.0 - 7.5   Color, UA Yellow Yellow   Appearance Ur Clear Clear   Leukocytes, UA Negative Negative   Protein, UA Negative Negative/Trace   Glucose, UA Negative Negative   Ketones, UA 2+ (A) Negative   RBC, UA Trace (A) Negative   Bilirubin, UA Negative Negative   Urobilinogen, Ur 0.2 0.2 - 1.0 mg/dL   Nitrite, UA Negative Negative   Microscopic Examination See below:       Assessment & Plan:   Problem List  Items Addressed This Visit    None    Visit  Diagnoses    Exposure to COVID-19 virus    -  Primary   Patient agreeable to getting tested today or tomorrow and going under quarantine at home until results are in, watching for sxs. Await results   Relevant Orders   Novel Coronavirus, NAA ([facility])       Follow up plan: Return if symptoms worsen or fail to improve.

## 2018-12-08 ENCOUNTER — Other Ambulatory Visit: Payer: Self-pay | Admitting: Psychiatry

## 2018-12-08 DIAGNOSIS — F3181 Bipolar II disorder: Secondary | ICD-10-CM

## 2019-01-05 ENCOUNTER — Other Ambulatory Visit: Payer: Self-pay

## 2019-01-05 ENCOUNTER — Ambulatory Visit (INDEPENDENT_AMBULATORY_CARE_PROVIDER_SITE_OTHER): Payer: 59 | Admitting: Family Medicine

## 2019-01-05 ENCOUNTER — Encounter: Payer: Self-pay | Admitting: Family Medicine

## 2019-01-05 DIAGNOSIS — K219 Gastro-esophageal reflux disease without esophagitis: Secondary | ICD-10-CM

## 2019-01-05 MED ORDER — FAMOTIDINE 20 MG PO TABS: 20.0000 mg | ORAL_TABLET | Freq: Two times a day (BID) | ORAL | 1 refills | Status: DC

## 2019-01-05 MED ORDER — DEXLANSOPRAZOLE 60 MG PO CPDR: 60.0000 mg | DELAYED_RELEASE_CAPSULE | Freq: Every day | ORAL | 1 refills | Status: DC

## 2019-01-05 NOTE — Progress Notes (Signed)
There were no vitals taken for this visit.   Subjective:    Patient ID: Bernard [name], male    DOB: 20-Apr-1975, 43 y.o.   MRN: [id number]  HPI: Bernard [name] is a 43 y.o. male  Chief Complaint  Patient presents with  . Gastroesophageal Reflux    Zantac was dicontinued, needs a new medication   GERD- was doing very well with the protonix and the zantac, he's been off the zantac last night and had some trouble GERD control status: exacerbated  Satisfied with current treatment? no Heartburn frequency:  Medication side effects: no  Medication compliance: worse Dysphagia: no Odynophagia:  no Hematemesis: no Blood in stool: no EGD: no  Relevant past medical, surgical, family and social history reviewed and updated as indicated. Interim medical history since our last visit reviewed. Allergies and medications reviewed and updated.  Review of Systems  Constitutional: Negative.   Respiratory: Negative.   Cardiovascular: Negative.   Gastrointestinal: Negative.  Negative for abdominal distention, abdominal pain, anal bleeding, blood in stool, constipation, diarrhea, nausea, rectal pain and vomiting.  Musculoskeletal: Negative.   Psychiatric/Behavioral: Negative.     Per HPI unless specifically indicated above     Objective:    There were no vitals taken for this visit.  Wt Readings from Last 3 Encounters:  04/17/18 200 lb (90.7 kg)  03/07/17 206 lb 1 oz (93.5 kg)  01/04/17 210 lb (95.3 kg)    Physical Exam Vitals signs and nursing note reviewed.  Constitutional:      General: He is not in acute distress.    Appearance: Normal appearance. He is not ill-appearing, toxic-appearing or diaphoretic.  HENT:     Head: Normocephalic and atraumatic.     Right Ear: External ear normal.     Left Ear: External ear normal.     Nose: Nose normal.     Mouth/Throat:     Mouth: Mucous membranes are moist.     Pharynx: Oropharynx is clear.  Eyes:     General: No  scleral icterus.       Right eye: No discharge.        Left eye: No discharge.     Conjunctiva/sclera: Conjunctivae normal.     Pupils: Pupils are equal, round, and reactive to light.  Neck:     Musculoskeletal: Normal range of motion.  Pulmonary:     Effort: Pulmonary effort is normal. No respiratory distress.     Comments: Speaking in full sentences Musculoskeletal: Normal range of motion.  Skin:    Coloration: Skin is not jaundiced or pale.     Findings: No bruising, erythema, lesion or rash.  Neurological:     Mental Status: He is alert and oriented to person, place, and time. Mental status is at baseline.  Psychiatric:        Mood and Affect: Mood normal.        Behavior: Behavior normal.        Thought Content: Thought content normal.        Judgment: Judgment normal.     Results for orders placed or performed in visit on 04/17/18  Microscopic Examination   URINE  Result Value Ref Range   WBC, UA None seen 0 - 5 /hpf   RBC, UA None seen 0 - 2 /hpf   Epithelial Cells (non renal) 0-10 0 - 10 /hpf   Mucus, UA Present Not Estab.   Bacteria, UA None seen None seen/Few  Comprehensive metabolic panel  Result Value Ref Range   Glucose 134 (H) 65 - 99 mg/dL   BUN 10 6 - 24 mg/dL   Creatinine, Ser 1.00 0.76 - 1.27 mg/dL   GFR calc non Af Amer 92 >59 mL/min/1.73   GFR calc Af Amer 107 >59 mL/min/1.73   BUN/Creatinine Ratio 10 9 - 20   Sodium 142 134 - 144 mmol/L   Potassium 4.3 3.5 - 5.2 mmol/L   Chloride 101 96 - 106 mmol/L   CO2 22 20 - 29 mmol/L   Calcium 9.6 8.7 - 10.2 mg/dL   Total Protein 6.5 6.0 - 8.5 g/dL   Albumin 4.7 4.0 - 5.0 g/dL   Globulin, Total 1.8 1.5 - 4.5 g/dL   Albumin/Globulin Ratio 2.6 (H) 1.2 - 2.2   Bilirubin Total 0.6 0.0 - 1.2 mg/dL   Alkaline Phosphatase 72 39 - 117 IU/L   AST 18 0 - 40 IU/L   ALT 25 0 - 44 IU/L  Lipid panel  Result Value Ref Range   Cholesterol, Total 170 100 - 199 mg/dL   Triglycerides 87 0 - 149 mg/dL   HDL 51 >39  mg/dL   VLDL Cholesterol Cal 17 5 - 40 mg/dL   LDL Calculated 102 (H) 0 - 99 mg/dL   Chol/HDL Ratio 3.3 0.0 - 5.0 ratio  CBC with Differential/Platelet  Result Value Ref Range   WBC 5.6 3.4 - 10.8 x10E3/uL   RBC 5.06 4.14 - 5.80 x10E6/uL   Hemoglobin 15.4 13.0 - 17.7 g/dL   Hematocrit 44.1 37.5 - 51.0 %   MCV 87 79 - 97 fL   MCH 30.4 26.6 - 33.0 pg   MCHC 34.9 31.5 - 35.7 g/dL   RDW 13.0 11.6 - 15.4 %   Platelets 252 150 - 450 x10E3/uL   Neutrophils 60 Not Estab. %   Lymphs 24 Not Estab. %   Monocytes 8 Not Estab. %   Eos 6 Not Estab. %   Basos 1 Not Estab. %   Neutrophils Absolute 3.4 1.4 - 7.0 x10E3/uL   Lymphocytes Absolute 1.4 0.7 - 3.1 x10E3/uL   Monocytes Absolute 0.4 0.1 - 0.9 x10E3/uL   EOS (ABSOLUTE) 0.3 0.0 - 0.4 x10E3/uL   Basophils Absolute 0.1 0.0 - 0.2 x10E3/uL   Immature Granulocytes 1 Not Estab. %   Immature Grans (Abs) 0.0 0.0 - 0.1 x10E3/uL  TSH  Result Value Ref Range   TSH 2.180 0.450 - 4.500 uIU/mL  Urinalysis, Routine w reflex microscopic  Result Value Ref Range   Specific Gravity, UA 1.025 1.005 - 1.030   pH, UA 5.5 5.0 - 7.5   Color, UA Yellow Yellow   Appearance Ur Clear Clear   Leukocytes, UA Negative Negative   Protein, UA Negative Negative/Trace   Glucose, UA Negative Negative   Ketones, UA 2+ (A) Negative   RBC, UA Trace (A) Negative   Bilirubin, UA Negative Negative   Urobilinogen, Ur 0.2 0.2 - 1.0 mg/dL   Nitrite, UA Negative Negative   Microscopic Examination See below:       Assessment & Plan:   Problem List Items Addressed This Visit      Digestive   Gastroesophageal reflux disease without esophagitis - Primary    Doing well on the dexilant, needs to change from the zantac. Will continue dexilant and start pepcid. Follow up at physical in the new year. Call with any concerns.       Relevant Medications   famotidine (PEPCID) 20 MG tablet  dexlansoprazole (DEXILANT) 60 MG capsule       Follow up plan: Return in about 6  weeks (around 02/16/2019) for Physical.   . This visit was completed via Doximity due to the restrictions of the COVID-19 pandemic. All issues as above were discussed and addressed. Physical exam was done as above through visual confirmation on Doximity. If it was felt that the patient should be evaluated in the office, they were directed there. The patient verbally consented to this visit. . Location of the patient: home . Location of the provider: work . Those involved with this call:  . Provider: Park Liter, DO . CMA: Tiffany Reel, CMA . Front Desk/Registration: Don Perking  . Time spent on call: 15 minutes with patient face to face via video conference. More than 50% of this time was spent in counseling and coordination of care. 23 minutes total spent in review of patient's record and preparation of their chart.

## 2019-01-05 NOTE — Assessment & Plan Note (Signed)
Doing well on the dexilant, needs to change from the zantac. Will continue dexilant and start pepcid. Follow up at physical in the new year. Call with any concerns.

## 2019-01-07 ENCOUNTER — Other Ambulatory Visit: Payer: Self-pay

## 2019-01-07 ENCOUNTER — Ambulatory Visit (INDEPENDENT_AMBULATORY_CARE_PROVIDER_SITE_OTHER): Payer: 59 | Admitting: Psychiatry

## 2019-01-07 ENCOUNTER — Encounter: Payer: Self-pay | Admitting: Psychiatry

## 2019-01-07 DIAGNOSIS — F17200 Nicotine dependence, unspecified, uncomplicated: Secondary | ICD-10-CM | POA: Diagnosis not present

## 2019-01-07 DIAGNOSIS — F3172 Bipolar disorder, in full remission, most recent episode hypomanic: Secondary | ICD-10-CM | POA: Diagnosis not present

## 2019-01-07 DIAGNOSIS — F411 Generalized anxiety disorder: Secondary | ICD-10-CM | POA: Diagnosis not present

## 2019-01-07 NOTE — Progress Notes (Signed)
\Virtual Visit via Video Note  I connected with Bernard [name] on 01/07/19 at  4:00 PM EST by a video enabled telemedicine application and verified that I am speaking with the correct person using two identifiers.   I discussed the limitations of evaluation and management by telemedicine and the availability of in person appointments. The patient expressed understanding and agreed to proceed.     I discussed the assessment and treatment plan with the patient. The patient was provided an opportunity to ask questions and all were answered. The patient agreed with the plan and demonstrated an understanding of the instructions.   The patient was advised to call back or seek an in-person evaluation if the symptoms worsen or if the condition fails to improve as anticipated.  [facility] MD OP Progress Note  01/07/2019 4:16 PM Bernard [name]  MRN:  [id number]  Chief Complaint:  Chief Complaint    Follow-up     HPI: Bernard [name] is a 43 year old Caucasian male, employed, lives in [place], has a history of bipolar disorder, tobacco use disorder, anxiety disorder was evaluated by telemedicine today.  Patient today reports he feels excited today since today is a very important day for him.  He reports he was offered a new position at a company which is bigger and better than where he is at right now.  Patient reports his mood symptoms has been stable.  He however reports episodes when he gets kind of 'snappy' with his children.  He however is aware about these emotions and is able to distract himself often.  He wants to continue to work on the same.  He reports sleep is good.  Patient denies any suicidality, homicidality or perceptual disturbances.  Patient reports he continues to smoke cigarettes however is currently working on cutting back.  He had quit smoking for a while however restarted it again.  He has not been to therapy sessions in 2 months or so.  Encouraged patient to restart  psychotherapy sessions.  He agrees to give his therapist a call soon.  Patient denies any other concerns today.   Visit Diagnosis:    ICD-10-CM   1. Bipolar disorder, in full remission, most recent episode hypomanic ([place])  F31.72   2. GAD (generalized anxiety disorder)  F41.1   3. Tobacco use disorder  F17.200     Past Psychiatric History: I have reviewed past psychiatric history from my progress note on 02/18/2018.  Past trials of Abilify, risperidone, Wellbutrin.  Past Medical History:  Past Medical History:  Diagnosis Date  . Anxiety   . Depression   . Esophageal stricture   . Hyperlipidemia   . Hypertension   . Weight loss     Past Surgical History:  Procedure Laterality Date  . PILONIDAL CYST EXCISION    . WISDOM TOOTH EXTRACTION      Family Psychiatric History: I have reviewed family psychiatric history from my progress note on 02/18/2018.  Family History:  Family History  Problem Relation Age of Onset  . Cancer Mother   . Anxiety disorder Mother   . Depression Mother   . Cancer Father   . Anxiety disorder Brother   . Depression Brother     Social History: Reviewed social history from my progress note on 02/18/2018. Social History   Socioeconomic History  . Marital status: Married    Spouse name: joan   . Number of children: 2  . Years of education: Not on file  . Highest education level: Bachelor's  degree (e.g., BA, AB, BS)  Occupational History  . Not on file  Social Needs  . Financial resource strain: Not hard at all  . Food insecurity    Worry: Never true    Inability: Never true  . Transportation needs    Medical: No    Non-medical: No  Tobacco Use  . Smoking status: Current Every Day Smoker    Packs/day: 0.25    Types: Cigarettes    Start date: 01/22/2017  . Smokeless tobacco: Never Used  . Tobacco comment: restarted again- working on cutting back  Substance and Sexual Activity  . Alcohol use: Yes    Alcohol/week: 2.0 - 6.0 standard  drinks    Types: 2 - 6 Cans of beer per week    Comment: on occasion  . Drug use: No  . Sexual activity: Yes    Birth control/protection: None  Lifestyle  . Physical activity    Days per week: 0 days    Minutes per session: 0 min  . Stress: Very much  Relationships  . Social connections    Talks on phone: More than three times a week    Gets together: Twice a week    Attends religious service: More than 4 times per year    Active member of club or organization: Yes    Attends meetings of clubs or organizations: More than 4 times per year    Relationship status: Married  Other Topics Concern  . Not on file  Social History Narrative  . Not on file    Allergies: No Known Allergies  Metabolic Disorder Labs: Lab Results  Component Value Date   HGBA1C 8.8 (H) 07/11/2015   No results found for: PROLACTIN Lab Results  Component Value Date   CHOL 170 04/17/2018   TRIG 87 04/17/2018   HDL 51 04/17/2018   CHOLHDL 3.3 04/17/2018   VLDL 13 08/06/2016   LDLCALC 102 (H) 04/17/2018   LDLCALC 42 03/07/2017   Lab Results  Component Value Date   TSH 2.180 04/17/2018   TSH 2.240 03/07/2017    Therapeutic Level Labs: No results found for: LITHIUM No results found for: VALPROATE No components found for:  CBMZ  Current Medications: Current Outpatient Medications  Medication Sig Dispense Refill  . amLODipine (NORVASC) 5 MG tablet Take 1 tablet (5 mg total) by mouth daily. 30 tablet 12  . ARIPiprazole (ABILIFY) 2 MG tablet TAKE 1 AND 1/2 TABLETS BY MOUTH DAILY. 45 tablet 3  . atorvastatin (LIPITOR) 40 MG tablet Take 1 tablet (40 mg total) by mouth daily. 30 tablet 12  . Cetirizine HCl 10 MG CAPS Take by mouth.    . CONTOUR NEXT TEST test strip     . dexlansoprazole (DEXILANT) 60 MG capsule Take 1 capsule (60 mg total) by mouth daily. 90 capsule 1  . famotidine (PEPCID) 20 MG tablet Take 1 tablet (20 mg total) by mouth 2 (two) times daily. 180 tablet 1  . Insulin Glargine  (LANTUS SOLOSTAR) 100 UNIT/ML Solostar Pen 50 units daily when off pump    . insulin lispro (HUMALOG) 100 UNIT/ML injection Use as directed in insulin pump. MDD: 90 units. E10.65    . Insulin Pen Needle 29G X 12.7MM MISC 1 each by Does not apply route 3 (three) times daily. 100 each 12  . lamoTRIgine (LAMICTAL) 25 MG tablet TAKE (2) TABLETS BY MOUTH EVERY DAY 60 tablet 1  . Lancets (ONETOUCH DELICA PLUS XBMWUX32G) MISC     .  meloxicam (MOBIC) 15 MG tablet Take 1 tablet (15 mg total) by mouth daily. 30 tablet 3   No current facility-administered medications for this visit.      Musculoskeletal: Strength & Muscle Tone: UTA Gait & Station: normal Patient leans: N/A  Psychiatric Specialty Exam: Review of Systems  Psychiatric/Behavioral: Negative for depression, hallucinations, substance abuse and suicidal ideas. The patient is not nervous/anxious and does not have insomnia.   All other systems reviewed and are negative.   There were no vitals taken for this visit.There is no height or weight on file to calculate BMI.  General Appearance: Casual  Eye Contact:  Fair  Speech:  Clear and Coherent  Volume:  Normal  Mood:  Euthymic  Affect:  Congruent  Thought Process:  Goal Directed and Descriptions of Associations: Intact  Orientation:  Full (Time, Place, and Person)  Thought Content: Logical   Suicidal Thoughts:  No  Homicidal Thoughts:  No  Memory:  Immediate;   Fair Recent;   Fair Remote;   Fair  Judgement:  Fair  Insight:  Fair  Psychomotor Activity:  Normal  Concentration:  Concentration: Fair and Attention Span: Fair  Recall:  [employer] of Knowledge: Fair  Language: Fair  Akathisia:  No  Handed:  Right  AIMS (if indicated): denies tremors, rigidity  Assets:  Communication Skills Desire for Improvement Housing Resilience Talents/Skills Transportation Vocational/Educational  ADL's:  Intact  Cognition: WNL  Sleep:  Fair   Screenings: PHQ2-9     Office Visit from  01/05/2019 in [place] Visit from 11/24/2018 in [place] Visit from 04/17/2018 in [place] Visit from 03/07/2017 in [place] Visit from 06/04/2016 in [place]  PHQ-2 Total Score  0  0  0  0  1  PHQ-9 Total Score  0  -  -  -  6       Assessment and Plan: Virgle is a 43 year old male, employed, married, lives in [place], has a history of bipolar disorder, anxiety disorder, diabetes, hypertension, hyperlipidemia was evaluated by telemedicine today.  Patient continues to be stable on current medication regimen.  Plan as noted below.  Plan Bipolar disorder in remission Abilify 3 mg p.o. daily Lamotrigine 50 mg p.o. daily  GAD-stable Continue CBT with Ms. Colleen Can  Tobacco use disorder-unstable Provided smoking cessation counseling.  He is cutting back.  Follow-up in clinic in 3 months or sooner if needed.  March 2 at 4 PM  I have spent atleast 15 minutes non face to face with patient today. More than 50 % of the time was spent for psychoeducation and supportive psychotherapy and care coordination. This note was generated in part or whole with voice recognition software. Voice recognition is usually quite accurate but there are transcription errors that can and very often do occur. I apologize for any typographical errors that were not detected and corrected.       Ursula Alert, MD 01/07/2019, 4:16 PM

## 2019-02-23 ENCOUNTER — Other Ambulatory Visit: Payer: Self-pay | Admitting: Family Medicine

## 2019-02-23 DIAGNOSIS — K219 Gastro-esophageal reflux disease without esophagitis: Secondary | ICD-10-CM

## 2019-02-23 MED ORDER — PANTOPRAZOLE SODIUM 40 MG PO TBEC: 40.0000 mg | DELAYED_RELEASE_TABLET | Freq: Every day | ORAL | 3 refills | Status: DC

## 2019-02-23 NOTE — Telephone Encounter (Signed)
Requested medication (s) are due for refill today: yes  Requested medication (s) are on the active medication list: yes  Last refill:  01/05/19  Future visit scheduled: no  Notes to clinic:  patient requesting generic see note from pharmacy    Requested Prescriptions  Pending Prescriptions Disp Refills   DEXILANT 60 MG capsule [Pharmacy Med Name: DEXILANT 60 MG CAP] 90 capsule 1    Sig: TAKE (1) [place]      Gastroenterology: Proton Pump Inhibitors Passed - 02/23/2019 10:16 AM      Passed - Valid encounter within last 12 months    Recent Outpatient Visits           1 month ago Gastroesophageal reflux disease without esophagitis   [facility] [place], Megan P, DO   3 months ago Exposure to COVID-19 virus   [facility], Lilia Argue, [place]   3 months ago Essential hypertension   [place], Jeannette How, MD   9 months ago Type 1 diabetes mellitus without complication [facility])   [facility] [place], Jeannette How, MD   10 months ago Immunization due   [facility], Jeannette How, MD

## 2019-02-23 NOTE — Telephone Encounter (Signed)
Last regular OV 10/27/18

## 2019-03-09 ENCOUNTER — Other Ambulatory Visit: Payer: Self-pay | Admitting: Psychiatry

## 2019-03-09 DIAGNOSIS — F3181 Bipolar II disorder: Secondary | ICD-10-CM

## 2019-03-17 ENCOUNTER — Telehealth: Payer: Self-pay

## 2019-03-17 NOTE — Telephone Encounter (Signed)
faxed and confirmed the prior auth for aripiprazole

## 2019-03-17 NOTE — Telephone Encounter (Signed)
received a prior auth for pt aripiprazole

## 2019-03-17 NOTE — Telephone Encounter (Signed)
pharmacy was faxed and confirmed the approval notice for medication.

## 2019-03-17 NOTE — Telephone Encounter (Signed)
prior auth was submitted online at [email or web address]. prior auth for the aripiprazole 35m was approved from  2-5--21 to  03-12-2020

## 2019-04-07 ENCOUNTER — Other Ambulatory Visit: Payer: Self-pay

## 2019-04-07 ENCOUNTER — Encounter: Payer: Self-pay | Admitting: Psychiatry

## 2019-04-07 ENCOUNTER — Ambulatory Visit (INDEPENDENT_AMBULATORY_CARE_PROVIDER_SITE_OTHER): Payer: 59 | Admitting: Psychiatry

## 2019-04-07 DIAGNOSIS — F17200 Nicotine dependence, unspecified, uncomplicated: Secondary | ICD-10-CM | POA: Diagnosis not present

## 2019-04-07 DIAGNOSIS — F3172 Bipolar disorder, in full remission, most recent episode hypomanic: Secondary | ICD-10-CM

## 2019-04-07 DIAGNOSIS — F411 Generalized anxiety disorder: Secondary | ICD-10-CM

## 2019-04-07 MED ORDER — ARIPIPRAZOLE 2 MG PO TABS: ORAL_TABLET | ORAL | 3 refills | Status: DC

## 2019-04-07 MED ORDER — VARENICLINE TARTRATE 0.5 MG PO TABS: 0.5000 mg | ORAL_TABLET | Freq: Two times a day (BID) | ORAL | 1 refills | Status: DC

## 2019-04-07 NOTE — Progress Notes (Signed)
Provider Location : [facility] Patient Location : Home  Virtual Visit via Video Note  I connected with Bernard [name] on 04/07/19 at  4:00 PM EST by a video enabled telemedicine application and verified that I am speaking with the correct person using two identifiers.   I discussed the limitations of evaluation and management by telemedicine and the availability of in person appointments. The patient expressed understanding and agreed to proceed.   I discussed the assessment and treatment plan with the patient. The patient was provided an opportunity to ask questions and all were answered. The patient agreed with the plan and demonstrated an understanding of the instructions.   The patient was advised to call back or seek an in-person evaluation if the symptoms worsen or if the condition fails to improve as anticipated.   [place] MD OP Progress Note  04/07/2019 4:15 PM Bernard [name]  MRN:  [id number]  Chief Complaint:  Chief Complaint    Follow-up     HPI: Bernard [name] is a 44 year old Caucasian male, employed, lives in [place], has a history of bipolar disorder, tobacco use disorder, anxiety disorder was evaluated by telemedicine today.  Patient today reports he continues to do well with regards to his mood symptoms.  He does have occasional episodes when he gets irritable especially due to situational stressors however he has been making use of his coping techniques and that is getting easier for him day by day.  He continues to follow-up with his therapist and is hoping to see her more frequently.  He reports sleep as good.  Patient denies any suicidality, homicidality or perceptual disturbances.  He is compliant on his medications as prescribed and denies side effects.  Patient continues to smoke cigarettes and is interested in smoking cessation.  Discussed Chantix.  He reports work is going well.  He is at this new position at work and reports he enjoys it.  Patient denies any  other concerns today.  Visit Diagnosis:    ICD-10-CM   1. Bipolar disorder, in full remission, most recent episode hypomanic ([place])  F31.72 ARIPiprazole (ABILIFY) 2 MG tablet  2. GAD (generalized anxiety disorder)  F41.1   3. Tobacco use disorder  F17.200 varenicline (CHANTIX) 0.5 MG tablet    Past Psychiatric History: I have reviewed past psychiatric history from my progress note on 02/18/2018.  Past trials of Abilify, risperidone, Wellbutrin  Past Medical History:  Past Medical History:  Diagnosis Date  . Anxiety   . Depression   . Esophageal stricture   . Hyperlipidemia   . Hypertension   . Weight loss     Past Surgical History:  Procedure Laterality Date  . PILONIDAL CYST EXCISION    . WISDOM TOOTH EXTRACTION      Family Psychiatric History: I have reviewed family psychiatric history from my progress note on 02/18/2018.  Family History:  Family History  Problem Relation Age of Onset  . Cancer Mother   . Anxiety disorder Mother   . Depression Mother   . Cancer Father   . Anxiety disorder Brother   . Depression Brother     Social History: Reviewed social history from my progress note on 02/18/2018. Social History   Socioeconomic History  . Marital status: Married    Spouse name: joan   . Number of children: 2  . Years of education: Not on file  . Highest education level: Bachelor's degree (e.g., BA, AB, BS)  Occupational History  . Not on file  Tobacco Use  .  Smoking status: Current Every Day Smoker    Packs/day: 0.25    Types: Cigarettes    Start date: 01/22/2017  . Smokeless tobacco: Never Used  . Tobacco comment: restarted again- working on cutting back  Substance and Sexual Activity  . Alcohol use: Yes    Alcohol/week: 2.0 - 6.0 standard drinks    Types: 2 - 6 Cans of beer per week    Comment: on occasion  . Drug use: No  . Sexual activity: Yes    Birth control/protection: None  Other Topics Concern  . Not on file  Social History Narrative  . Not  on file   Social Determinants of Health   Financial Resource Strain:   . Difficulty of Paying Living Expenses: Not on file  Food Insecurity:   . Worried About Charity fundraiser in the Last Year: Not on file  . Ran Out of Food in the Last Year: Not on file  Transportation Needs:   . Lack of Transportation (Medical): Not on file  . Lack of Transportation (Non-Medical): Not on file  Physical Activity:   . Days of Exercise per Week: Not on file  . Minutes of Exercise per Session: Not on file  Stress:   . Feeling of Stress : Not on file  Social Connections:   . Frequency of Communication with Friends and Family: Not on file  . Frequency of Social Gatherings with Friends and Family: Not on file  . Attends Religious Services: Not on file  . Active Member of Clubs or Organizations: Not on file  . Attends Archivist Meetings: Not on file  . Marital Status: Not on file    Allergies: No Known Allergies  Metabolic Disorder Labs: Lab Results  Component Value Date   HGBA1C 8.8 (H) 07/11/2015   No results found for: PROLACTIN Lab Results  Component Value Date   CHOL 170 04/17/2018   TRIG 87 04/17/2018   HDL 51 04/17/2018   CHOLHDL 3.3 04/17/2018   VLDL 13 08/06/2016   LDLCALC 102 (H) 04/17/2018   LDLCALC 42 03/07/2017   Lab Results  Component Value Date   TSH 2.180 04/17/2018   TSH 2.240 03/07/2017    Therapeutic Level Labs: No results found for: LITHIUM No results found for: VALPROATE No components found for:  CBMZ  Current Medications: Current Outpatient Medications  Medication Sig Dispense Refill  . insulin aspart (NOVOLOG) 100 UNIT/ML injection Up to 100 units in pump daily.    [name] amLODipine (NORVASC) 5 MG tablet Take 1 tablet (5 mg total) by mouth daily. 30 tablet 12  . ARIPiprazole (ABILIFY) 2 MG tablet TAKE 1 AND 1/2 TABLETS BY MOUTH DAILY. 45 tablet 3  . atorvastatin (LIPITOR) 40 MG tablet Take 1 tablet (40 mg total) by mouth daily. 30 tablet 12  .  Cetirizine HCl 10 MG CAPS Take by mouth.    . CONTOUR NEXT TEST test strip     . famotidine (PEPCID) 20 MG tablet Take 1 tablet (20 mg total) by mouth 2 (two) times daily. 180 tablet 1  . Insulin Glargine (LANTUS SOLOSTAR) 100 UNIT/ML Solostar Pen 50 units daily when off pump    . insulin lispro (HUMALOG) 100 UNIT/ML injection Use as directed in insulin pump. MDD: 90 units. E10.65    . Insulin Pen Needle 29G X 12.7MM MISC 1 each by Does not apply route 3 (three) times daily. 100 each 12  . lamoTRIgine (LAMICTAL) 25 MG tablet TAKE (2) TABLETS BY  MOUTH EVERY DAY 60 tablet 1  . Lancets (ONETOUCH DELICA PLUS QIONGE95M) MISC     . meloxicam (MOBIC) 15 MG tablet Take 1 tablet (15 mg total) by mouth daily. 30 tablet 3  . NOVOLOG 100 UNIT/ML injection     . pantoprazole (PROTONIX) 40 MG tablet Take 1 tablet (40 mg total) by mouth daily. 90 tablet 3  . varenicline (CHANTIX) 0.5 MG tablet Take 1 tablet (0.5 mg total) by mouth 2 (two) times daily. 60 tablet 1   No current facility-administered medications for this visit.     Musculoskeletal: Strength & Muscle Tone: UTA Gait & Station: normal Patient leans: N/A  Psychiatric Specialty Exam: Review of Systems  Psychiatric/Behavioral: Negative for agitation, behavioral problems, confusion, decreased concentration, dysphoric mood, hallucinations, self-injury, sleep disturbance and suicidal ideas. The patient is not nervous/anxious and is not hyperactive.   All other systems reviewed and are negative.   There were no vitals taken for this visit.There is no height or weight on file to calculate BMI.  General Appearance: Casual  Eye Contact:  Fair  Speech:  Clear and Coherent  Volume:  Normal  Mood:  Euthymic  Affect:  Congruent  Thought Process:  Goal Directed and Descriptions of Associations: Intact  Orientation:  Full (Time, Place, and Person)  Thought Content: Logical   Suicidal Thoughts:  No  Homicidal Thoughts:  No  Memory:  Immediate;    Fair Recent;   Fair Remote;   Fair  Judgement:  Fair  Insight:  Fair  Psychomotor Activity:  Normal  Concentration:  Concentration: Fair and Attention Span: Fair  Recall:  [employer] of Knowledge: Fair  Language: Fair  Akathisia:  No  Handed:  Right  AIMS (if indicated): Denies tremors, rigidity  Assets:  Communication Skills Desire for Improvement Housing Social Support  ADL's:  Intact  Cognition: WNL  Sleep:  Fair   Screenings: PHQ2-9     Office Visit from 01/05/2019 in [facility] Visit from 11/24/2018 in [facility] Visit from 04/17/2018 in [facility] Visit from 03/07/2017 in [facility] Visit from 06/04/2016 in [facility]  PHQ-2 Total Score  0  0  0  0  1  PHQ-9 Total Score  0  --  --  --  6       Assessment and Plan: Bernard [name] is a 44 year old male, employed, married, lives in [place], has a history of bipolar disorder, anxiety disorder, diabetes, hypertension, hyperlipidemia was evaluated by telemedicine today.  Patient is currently doing well on the current medication regimen.  Plan as noted below.  Plan Bipolar disorder in remission Abilify 3 mg p.o. daily Lamotrigine 50 mg p.o. daily  GAD-stable Continue CBT with Ms. Colleen Can  Tobacco use disorder-unstable Provided smoking cessation counseling. Start Chantix 0.5 mg p.o. twice daily.  Provided medication education.  Follow-up in clinic in 3 months or sooner if needed.  I have spent atleast 20 minutes non face to face with patient today. More than 50 % of the time was spent for ordering medications and test ,psychoeducation and supportive psychotherapy and care coordination,as well as documenting clinical information in electronic health record. This note was generated in part or whole with voice recognition software. Voice recognition is usually quite accurate but there are transcription errors that can and very often  do occur. I apologize for any typographical errors that were not detected and corrected.       [place] Dod[place],  MD 04/07/2019, 4:15 PM

## 2019-04-13 ENCOUNTER — Other Ambulatory Visit: Payer: Self-pay

## 2019-04-13 MED ORDER — MELOXICAM 15 MG PO TABS: 15.0000 mg | ORAL_TABLET | Freq: Every day | ORAL | 3 refills | Status: DC

## 2019-04-13 NOTE — Telephone Encounter (Signed)
Fax requesting meloxicam 15 mg tab refill  [facility]

## 2019-04-20 ENCOUNTER — Ambulatory Visit (INDEPENDENT_AMBULATORY_CARE_PROVIDER_SITE_OTHER): Payer: Managed Care, Other (non HMO) | Admitting: Nurse Practitioner

## 2019-04-20 ENCOUNTER — Other Ambulatory Visit: Payer: Self-pay

## 2019-04-20 ENCOUNTER — Encounter: Payer: Self-pay | Admitting: Nurse Practitioner

## 2019-04-20 VITALS — BP 116/74 | HR 96 | Temp 98.3°F | Ht 69.5 in | Wt 204.4 lb

## 2019-04-20 DIAGNOSIS — E1069 Type 1 diabetes mellitus with other specified complication: Secondary | ICD-10-CM | POA: Diagnosis not present

## 2019-04-20 DIAGNOSIS — I10 Essential (primary) hypertension: Secondary | ICD-10-CM

## 2019-04-20 DIAGNOSIS — Z0000 Encounter for general adult medical examination without abnormal findings: Secondary | ICD-10-CM | POA: Diagnosis not present

## 2019-04-20 DIAGNOSIS — F3181 Bipolar II disorder: Secondary | ICD-10-CM

## 2019-04-20 DIAGNOSIS — K219 Gastro-esophageal reflux disease without esophagitis: Secondary | ICD-10-CM

## 2019-04-20 DIAGNOSIS — E109 Type 1 diabetes mellitus without complications: Secondary | ICD-10-CM | POA: Diagnosis not present

## 2019-04-20 DIAGNOSIS — M25512 Pain in left shoulder: Secondary | ICD-10-CM

## 2019-04-20 DIAGNOSIS — E785 Hyperlipidemia, unspecified: Secondary | ICD-10-CM

## 2019-04-20 DIAGNOSIS — F339 Major depressive disorder, recurrent, unspecified: Secondary | ICD-10-CM

## 2019-04-20 DIAGNOSIS — F17200 Nicotine dependence, unspecified, uncomplicated: Secondary | ICD-10-CM

## 2019-04-20 DIAGNOSIS — E10319 Type 1 diabetes mellitus with unspecified diabetic retinopathy without macular edema: Secondary | ICD-10-CM

## 2019-04-20 LAB — MICROALBUMIN, URINE WAIVED
Creatinine, Urine Waived: 50 mg/dL (ref 10–300)
Microalb, Ur Waived: 10 mg/L (ref 0–19)

## 2019-04-20 MED ORDER — DICLOFENAC SODIUM 1 % EX GEL: 2.0000 g | Freq: Four times a day (QID) | CUTANEOUS | 1 refills | Status: DC

## 2019-04-20 NOTE — Assessment & Plan Note (Signed)
Chronic, stable.  Follows with endocrinology.  Last A1c in January 2021 was 6.1%.  Urine Microalbumin checked today.

## 2019-04-20 NOTE — Assessment & Plan Note (Addendum)
Chronic, stable per patient.  Follows with Ophthalmologist; has appointment in July 2021.

## 2019-04-20 NOTE — Assessment & Plan Note (Signed)
Chronic, ongoing.  Patient desiring smoking cessation tools.  Previous poor response to Chantix.  Discussed Wellbutrin, patient desires discussing with Psychiatry prior to starting.  Will let us know if he would like to try this in the future.

## 2019-04-20 NOTE — Assessment & Plan Note (Addendum)
Acute, ongoing.  Patient reports new pain in left shoulder after previously rehabbing left shoulder the past year.  No better with ice or meloxicam.  Will try diclofenac gel, Rx sent to pharmacy.  Referral to orthopedics placed.

## 2019-04-20 NOTE — Assessment & Plan Note (Addendum)
Chronic, stable.  BP well-controlled in office today.  Continue current regimen.  If future adjustment needed, may consider switching to ACE in future for kidney protection.

## 2019-04-20 NOTE — Patient Instructions (Signed)
Nice meeting you today!  We will let you know about your lab results tomorrow.  You will hear from our referral team about the orthopedic referral.  Take care and let me know if you need anything. - Janett Billow

## 2019-04-20 NOTE — Assessment & Plan Note (Signed)
Chronic, stable.  Continue Lipitor 9m.  May consider baby aspirin in future for cardiovascular protection.  Lipids checked today.

## 2019-04-20 NOTE — Assessment & Plan Note (Signed)
Chronic, stable.  Follows with Psychiatry.  Reports mood is stable with current medication regimen.  Continue to follow with Psychiatry.

## 2019-04-20 NOTE — Progress Notes (Signed)
BP 116/74 (BP Location: Left Arm, Patient Position: Sitting, Cuff Size: Normal)   Pulse 96   Temp 98.3 F (36.8 C) (Oral)   Ht 5' 9.5" (1.765 m)   Wt 204 lb 6.4 oz (92.7 kg)   SpO2 98%   BMI 29.75 kg/m    Subjective:    Patient ID: Bernard [name], male    DOB: 1975-03-01, 44 y.o.   MRN: [id number]  HPI: Bernard [name] is a 44 y.o. male presenting on 04/20/2019 for comprehensive medical examination. Current medical complaints include:   SHOULDER PAIN Duration: months - 2 Involved shoulder: left Mechanism of injury: unknown Location: diffuse Onset:sudden Severity: varies between 3/11-11-08  Quality:  sharp Frequency: intermittent Radiation: yes Aggravating factors: lifting, sleep and throwing  Alleviating factors: ice and NSAIDs  Status: worse Treatments attempted: Meloxicam, ice and ibuprofen  Relief with NSAIDs?:  mild Weakness: no Numbness: no Decreased grip strength: no Redness: no Swelling: no Bruising: no Fevers: no  HYPERTENSION Hypertension status: controlled  Satisfied with current treatment? no Duration of hypertension: chronic BP monitoring frequency:  not checking BP medication side effects:  no Medication compliance: excellent compliance Previous BP meds:amlodipine Aspirin: no Recurrent headaches: no Visual changes: no Palpitations: no Dyspnea: no Chest pain: no Lower extremity edema: no Dizzy/lightheaded: no  He currently lives with: wife; foster kittens and 2 children Interim Problems from his last visit: no  Depression Screen done today and results listed below:  Depression screen [facility] 2/9 04/20/2019 04/20/2019 01/05/2019 11/24/2018 04/17/2018  Decreased Interest 0 - 0 0 0  Down, Depressed, Hopeless 1 1 0 0 0  PHQ - 2 Score 1 1 0 0 0  Altered sleeping - - 0 - -  Tired, decreased energy - - 0 - -  Change in appetite - - 0 - -  Feeling bad or failure about yourself  - - 0 - -  Trouble concentrating - - 0 - -  Moving slowly  or fidgety/restless - - 0 - -  Suicidal thoughts - - 0 - -  PHQ-9 Score - - 0 - -  Difficult doing work/chores - - Not difficult at all - -   The patient does not have a history of falls. I did not complete a risk assessment for falls. A plan of care for falls was not documented.   Past Medical History:  Past Medical History:  Diagnosis Date  . Anxiety   . Depression   . Esophageal stricture   . Hyperlipidemia   . Hypertension   . Weight loss     Surgical History:  Past Surgical History:  Procedure Laterality Date  . PILONIDAL CYST EXCISION    . WISDOM TOOTH EXTRACTION      Medications:  Current Outpatient Medications on File Prior to Visit  Medication Sig  . amLODipine (NORVASC) 5 MG tablet Take 1 tablet (5 mg total) by mouth daily.  . ARIPiprazole (ABILIFY) 2 MG tablet TAKE 1 AND 1/2 TABLETS BY MOUTH DAILY.  [name] atorvastatin (LIPITOR) 40 MG tablet Take 1 tablet (40 mg total) by mouth daily.  . Cetirizine HCl 10 MG CAPS Take by mouth.  . CONTOUR NEXT TEST test strip   . famotidine (PEPCID) 20 MG tablet Take 1 tablet (20 mg total) by mouth 2 (two) times daily.  . insulin aspart (NOVOLOG) 100 UNIT/ML injection Up to 100 units in pump daily.  . Insulin Glargine (LANTUS SOLOSTAR) 100 UNIT/ML Solostar Pen 50 units daily when off pump  .  insulin lispro (HUMALOG) 100 UNIT/ML injection Use as directed in insulin pump. MDD: 90 units. E10.65  . Insulin Pen Needle 29G X 12.7MM MISC 1 each by Does not apply route 3 (three) times daily.  [name] lamoTRIgine (LAMICTAL) 25 MG tablet TAKE (2) TABLETS BY MOUTH EVERY DAY  . Lancets (ONETOUCH DELICA PLUS [id number]) MISC   . meloxicam (MOBIC) 15 MG tablet Take 1 tablet (15 mg total) by mouth daily.  . pantoprazole (PROTONIX) 40 MG tablet Take 1 tablet (40 mg total) by mouth daily.  . varenicline (CHANTIX) 0.5 MG tablet Take 1 tablet (0.5 mg total) by mouth 2 (two) times daily. (Patient not taking: Reported on 04/20/2019)   No current  facility-administered medications on file prior to visit.    Allergies:  No Known Allergies  Social History:  Social History   Socioeconomic History  . Marital status: Married    Spouse name: joan   . Number of children: 2  . Years of education: Not on file  . Highest education level: Bachelor's degree (e.g., BA, AB, BS)  Occupational History  . Not on file  Tobacco Use  . Smoking status: Current Every Day Smoker    Packs/day: 0.25    Types: Cigarettes    Start date: 01/22/2017  . Smokeless tobacco: Never Used  . Tobacco comment: restarted again- working on cutting back  Substance and Sexual Activity  . Alcohol use: Yes    Alcohol/week: 2.0 - 6.0 standard drinks    Types: 2 - 6 Cans of beer per week    Comment: on occasion  . Drug use: No  . Sexual activity: Yes    Birth control/protection: None  Other Topics Concern  . Not on file  Social History Narrative  . Not on file   Social Determinants of Health   Financial Resource Strain:   . Difficulty of Paying Living Expenses:   Food Insecurity:   . Worried About Charity fundraiser in the Last Year:   . Arboriculturist in the Last Year:   Transportation Needs:   . Film/video editor (Medical):   [name] Lack of Transportation (Non-Medical):   Physical Activity:   . Days of Exercise per Week:   . Minutes of Exercise per Session:   Stress:   . Feeling of Stress :   Social Connections:   . Frequency of Communication with Friends and Family:   . Frequency of Social Gatherings with Friends and Family:   . Attends Religious Services:   . Active Member of Clubs or Organizations:   . Attends Archivist Meetings:   [name] Marital Status:   Intimate Partner Violence:   . Fear of Current or Ex-Partner:   . Emotionally Abused:   [name] Physically Abused:   . Sexually Abused:    Social History   Tobacco Use  Smoking Status Current Every Day Smoker  . Packs/day: 0.25  . Types: Cigarettes  . Start date: 01/22/2017    Smokeless Tobacco Never Used  Tobacco Comment   restarted again- working on cutting back   Social History   Substance and Sexual Activity  Alcohol Use Yes  . Alcohol/week: 2.0 - 6.0 standard drinks  . Types: 2 - 6 Cans of beer per week   Comment: on occasion    Family History:  Family History  Problem Relation Age of Onset  . Cancer Mother   . Anxiety disorder Mother   . Depression Mother   . Cancer Father   .  Anxiety disorder Brother   . Depression Brother     Past medical history, surgical history, medications, allergies, family history and social history reviewed with patient today and changes made to appropriate areas of the chart.   Review of Systems  Constitutional: Negative.  Negative for chills, diaphoresis and fever.  HENT: Negative.  Negative for hearing loss, sore throat and tinnitus.   Eyes: Negative.  Negative for blurred vision, double vision and photophobia.  Respiratory: Negative.  Negative for cough, shortness of breath and wheezing.   Cardiovascular: Negative for chest pain, palpitations and leg swelling.  Gastrointestinal: Positive for nausea. Negative for abdominal pain, blood in stool, constipation, diarrhea and vomiting.  Genitourinary: Negative.  Negative for dysuria, frequency and urgency.  Musculoskeletal: Positive for myalgias.  Skin: Negative.  Negative for rash.  Neurological: Negative.  Negative for dizziness, weakness and headaches.  Psychiatric/Behavioral: Negative for depression, hallucinations and suicidal ideas. The patient is not nervous/anxious and does not have insomnia.    All other ROS negative except what is listed above and in the HPI.      Objective:    BP 116/74 (BP Location: Left Arm, Patient Position: Sitting, Cuff Size: Normal)   Pulse 96   Temp 98.3 F (36.8 C) (Oral)   Ht 5' 9.5" (1.765 m)   Wt 204 lb 6.4 oz (92.7 kg)   SpO2 98%   BMI 29.75 kg/m   Wt Readings from Last 3 Encounters:  04/20/19 204 lb 6.4 oz (92.7  kg)  04/17/18 200 lb (90.7 kg)  03/07/17 206 lb 1 oz (93.5 kg)    Physical Exam Constitutional:      Appearance: Normal appearance. He is normal weight.  HENT:     Head: Normocephalic.     Right Ear: Tympanic membrane, ear canal and external ear normal.     Left Ear: Tympanic membrane, ear canal and external ear normal.     Nose: Nose normal. No congestion.     Mouth/Throat:     Mouth: Mucous membranes are moist.     Pharynx: Oropharynx is clear. No oropharyngeal exudate or posterior oropharyngeal erythema.  Eyes:     General: No scleral icterus.    Extraocular Movements: Extraocular movements intact.     Pupils: Pupils are equal, round, and reactive to light.  Cardiovascular:     Rate and Rhythm: Normal rate and regular rhythm.     Heart sounds: Normal heart sounds. No murmur. No gallop.   Pulmonary:     Effort: Pulmonary effort is normal. No respiratory distress.     Breath sounds: Normal breath sounds. No wheezing or rhonchi.  Abdominal:     General: Abdomen is flat. Bowel sounds are normal. There is no distension.     Palpations: Abdomen is soft.     Tenderness: There is no abdominal tenderness. There is no right CVA tenderness or left CVA tenderness.  Musculoskeletal:        General: No swelling or tenderness. Normal range of motion.     Cervical back: Normal range of motion and neck supple. No tenderness.     Right lower leg: No edema.     Left lower leg: No edema.  Lymphadenopathy:     Cervical: No cervical adenopathy.  Skin:    General: Skin is warm and dry.     Capillary Refill: Capillary refill takes less than 2 seconds.     Coloration: Skin is not jaundiced.     Findings: No lesion.  Neurological:  General: No focal deficit present.     Mental Status: He is alert and oriented to person, place, and time. Mental status is at baseline.     Motor: No weakness.     Gait: Gait normal.  Psychiatric:        Mood and Affect: Mood normal.        Behavior: Behavior  normal.        Thought Content: Thought content normal.        Judgment: Judgment normal.        Assessment & Plan:   Problem List Items Addressed This Visit      Cardiovascular and Mediastinum   Hypertension    Chronic, stable.  BP well-controlled in office today.  Continue current regimen.  If future adjustment needed, may consider switching to ACE in future for kidney protection.      Relevant Orders   Comprehensive metabolic panel   Microalbumin, Urine Waived     Digestive   Gastroesophageal reflux disease without esophagitis    Chronic, stable.  Well controlled on current regimen.  Continue current regimen.  Patient to call with concerns.       Relevant Orders   CBC with Differential/Platelet     Endocrine   DM type 1 (diabetes mellitus, type 1) (HCC)    Chronic, stable.  Follows with endocrinology.  Last A1c in January 2021 was 6.1%.  Urine Microalbumin checked today.      Relevant Orders   TSH   Comprehensive metabolic panel   Microalbumin, Urine Waived   Hyperlipidemia due to type 1 diabetes mellitus (HCC)    Chronic, stable.  Continue Lipitor 43m.  May consider baby aspirin in future for cardiovascular protection.  Lipids checked today.      Relevant Orders   Lipid Panel w/o Chol/HDL Ratio   Comprehensive metabolic panel   Diabetic retinopathy associated with type 1 diabetes mellitus (HCC)    Chronic, stable per patient.  Follows with Ophthalmologist; has appointment in July 2021.        Other   Depression, recurrent ([place]    Chronic, stable per patient.  Follows with Psychiatry.  PHQ-9 elevated slightly in office today due to shoulder pain per patient.  Reports otherwise, mood is very stable.  Continue to follow with Psychiatry.      Shoulder pain, left    Acute, ongoing.  Patient reports new pain in left shoulder after previously rehabbing left shoulder the past year.  No better with ice or meloxicam.  Will try diclofenac gel, Rx sent to pharmacy.   Referral to orthopedics placed.      Relevant Medications   diclofenac Sodium (VOLTAREN) 1 % GEL   Other Relevant Orders   Ambulatory referral to Orthopedics   Bipolar 2 disorder (HCC)    Chronic, stable.  Follows with Psychiatry.  Reports mood is stable with current medication regimen.  Continue to follow with Psychiatry.      Tobacco use disorder    Chronic, ongoing.  Patient desiring smoking cessation tools.  Previous poor response to Chantix.  Discussed Wellbutrin, patient desires discussing with Psychiatry prior to starting.  Will let [place] if he would like to try this in the future.       Other Visit Diagnoses    Annual physical exam    -  Primary   Relevant Orders   Lipid Panel w/o Chol/HDL Ratio   TSH   Comprehensive metabolic panel   CBC with Differential/Platelet  Microalbumin, Urine Waived        Discussed aspirin prophylaxis for myocardial infarction prevention and decision was it was not indicated  LABORATORY TESTING:  Health maintenance labs ordered today as discussed above.   IMMUNIZATIONS:   - Tdap: Tetanus vaccination status reviewed: last tetanus booster within 10 years. - Influenza: Refused - Pneumovax: Up to date - Prevnar: Not applicable - HPV: Not applicable - Zostavax vaccine: Not applicable  SCREENING: - Colonoscopy: Not applicable  Discussed with patient purpose of the colonoscopy is to detect colon cancer at curable precancerous or early stages   - AAA Screening: Not applicable  -Hearing Test: Not applicable  -Spirometry: Not applicable   PATIENT COUNSELING:    Sexuality: Discussed sexually transmitted diseases, partner selection, use of condoms, avoidance of unintended pregnancy  and contraceptive alternatives.   Advised to avoid cigarette smoking.  I discussed with the patient that most people either abstain from alcohol or drink within safe limits (<=14/week and <=4 drinks/occasion for males, <=7/weeks and <= 3 drinks/occasion for  females) and that the risk for alcohol disorders and other health effects rises proportionally with the number of drinks per week and how often a drinker exceeds daily limits.  Discussed cessation/primary prevention of drug use and availability of treatment for abuse.   Diet: Encouraged to adjust caloric intake to maintain  or achieve ideal body weight, to reduce intake of dietary saturated fat and total fat, to limit sodium intake by avoiding high sodium foods and not adding table salt, and to maintain adequate dietary potassium and calcium preferably from fresh fruits, vegetables, and low-fat dairy products.    stressed the importance of regular exercise  Injury prevention: Discussed safety belts, safety helmets, smoke detector, smoking near bedding or upholstery.   Dental health: Discussed importance of regular tooth brushing, flossing, and dental visits.   Follow up plan: NEXT PREVENTATIVE PHYSICAL DUE IN 1 YEAR. Return in about 6 months (around 10/21/2019) for HTN/HLD follow up.

## 2019-04-20 NOTE — Assessment & Plan Note (Addendum)
Chronic, stable per patient.  Follows with Psychiatry.  PHQ-9 elevated slightly in office today due to shoulder pain per patient.  Reports otherwise, mood is very stable.  Continue to follow with Psychiatry.

## 2019-04-20 NOTE — Assessment & Plan Note (Signed)
Chronic, stable.  Well controlled on current regimen.  Continue current regimen.  Patient to call with concerns.

## 2019-04-21 ENCOUNTER — Telehealth: Payer: Self-pay | Admitting: Nurse Practitioner

## 2019-04-21 DIAGNOSIS — I10 Essential (primary) hypertension: Secondary | ICD-10-CM

## 2019-04-21 LAB — CBC WITH DIFFERENTIAL/PLATELET
Basophils Absolute: 0.1 10*3/uL (ref 0.0–0.2)
Basos: 2 %
EOS (ABSOLUTE): 0.4 10*3/uL (ref 0.0–0.4)
Eos: 7 %
Hematocrit: 42.8 % (ref 37.5–51.0)
Hemoglobin: 15.2 g/dL (ref 13.0–17.7)
Immature Grans (Abs): 0 10*3/uL (ref 0.0–0.1)
Immature Granulocytes: 1 %
Lymphocytes Absolute: 1.1 10*3/uL (ref 0.7–3.1)
Lymphs: 20 %
MCH: 31.2 pg (ref 26.6–33.0)
MCHC: 35.5 g/dL (ref 31.5–35.7)
MCV: 88 fL (ref 79–97)
Monocytes Absolute: 0.4 10*3/uL (ref 0.1–0.9)
Monocytes: 8 %
Neutrophils Absolute: 3.7 10*3/uL (ref 1.4–7.0)
Neutrophils: 62 %
Platelets: 215 10*3/uL (ref 150–450)
RBC: 4.87 x10E6/uL (ref 4.14–5.80)
RDW: 12.4 % (ref 11.6–15.4)
WBC: 5.8 10*3/uL (ref 3.4–10.8)

## 2019-04-21 LAB — COMPREHENSIVE METABOLIC PANEL
ALT: 33 IU/L (ref 0–44)
AST: 27 IU/L (ref 0–40)
Albumin/Globulin Ratio: 2.4 — ABNORMAL HIGH (ref 1.2–2.2)
Albumin: 4.6 g/dL (ref 4.0–5.0)
Alkaline Phosphatase: 64 IU/L (ref 39–117)
BUN/Creatinine Ratio: 9 (ref 9–20)
BUN: 9 mg/dL (ref 6–24)
Bilirubin Total: 0.5 mg/dL (ref 0.0–1.2)
CO2: 24 mmol/L (ref 20–29)
Calcium: 9.1 mg/dL (ref 8.7–10.2)
Chloride: 103 mmol/L (ref 96–106)
Creatinine, Ser: 1.03 mg/dL (ref 0.76–1.27)
GFR calc Af Amer: 102 mL/min/{1.73_m2} (ref >59)
GFR calc non Af Amer: 89 mL/min/{1.73_m2} (ref >59)
Globulin, Total: 1.9 g/dL (ref 1.5–4.5)
Glucose: 136 mg/dL — ABNORMAL HIGH (ref 65–99)
Potassium: 4.2 mmol/L (ref 3.5–5.2)
Sodium: 140 mmol/L (ref 134–144)
Total Protein: 6.5 g/dL (ref 6.0–8.5)

## 2019-04-21 LAB — LIPID PANEL W/O CHOL/HDL RATIO
Cholesterol, Total: 128 mg/dL (ref 100–199)
HDL: 54 mg/dL (ref >39)
LDL Chol Calc (NIH): 61 mg/dL (ref 0–99)
Triglycerides: 61 mg/dL (ref 0–149)
VLDL Cholesterol Cal: 13 mg/dL (ref 5–40)

## 2019-04-21 LAB — TSH: TSH: 1.95 u[IU]/mL (ref 0.450–4.500)

## 2019-04-21 MED ORDER — LISINOPRIL 10 MG PO TABS: 10.0000 mg | ORAL_TABLET | Freq: Every day | ORAL | 0 refills | Status: DC

## 2019-04-21 NOTE — Telephone Encounter (Signed)
HIPAA compliant message left to return call to discuss lab results.

## 2019-04-21 NOTE — Telephone Encounter (Signed)
I called Bernard [name] and we discussed his labs.  All of his labs look very stable.  The urine microalbumin/creatinine ratio was elevated compared to previously.  We discussed options for controlling BP including current regimen with CCB vs. Other alternatives that offer kidney protection like ACE-inhibitors and ARBs.  Patient agreeable to change amlodipine to lisinopril at this time to offer greater kidney protection.  Side effects discussed and all questions answered.  Advised to start monitoring BP at home while switching medication.  New Rx sent into pharmacy.  Will schedule follow up appointment for 4 weeks to check on BP.

## 2019-05-04 ENCOUNTER — Other Ambulatory Visit: Payer: Self-pay

## 2019-05-04 ENCOUNTER — Other Ambulatory Visit: Payer: Self-pay | Admitting: Psychiatry

## 2019-05-04 DIAGNOSIS — F3181 Bipolar II disorder: Secondary | ICD-10-CM

## 2019-05-04 DIAGNOSIS — E7800 Pure hypercholesterolemia, unspecified: Secondary | ICD-10-CM

## 2019-05-04 MED ORDER — ATORVASTATIN CALCIUM 40 MG PO TABS: 40.0000 mg | ORAL_TABLET | Freq: Every day | ORAL | 1 refills | Status: DC

## 2019-05-04 NOTE — Telephone Encounter (Signed)
Refill request for Atorvastatin LOV: 04/20/2019 Next Appt: 05/22/2019 both with [name].

## 2019-05-11 ENCOUNTER — Telehealth: Payer: Self-pay

## 2019-05-11 ENCOUNTER — Other Ambulatory Visit: Payer: Self-pay | Admitting: Nurse Practitioner

## 2019-05-11 MED ORDER — ONDANSETRON 4 MG PO TBDP: 4.0000 mg | ORAL_TABLET | Freq: Three times a day (TID) | ORAL | 0 refills | Status: DC | PRN

## 2019-05-11 NOTE — Telephone Encounter (Signed)
Refill request for Ondansetron 62m dissolvable tablet LOV: 04/20/2019 Next Appt: 05/22/2019 both with JJanett Billow   Not in current medication list, but looks like medication has been prescribed since 02/17/2016 for as needed.  Routing to provider.

## 2019-05-11 NOTE — Telephone Encounter (Signed)
Refill sent in

## 2019-05-12 ENCOUNTER — Encounter: Payer: Self-pay | Admitting: Nurse Practitioner

## 2019-05-12 ENCOUNTER — Other Ambulatory Visit: Payer: Self-pay

## 2019-05-12 ENCOUNTER — Telehealth (INDEPENDENT_AMBULATORY_CARE_PROVIDER_SITE_OTHER): Payer: Managed Care, Other (non HMO) | Admitting: Nurse Practitioner

## 2019-05-12 VITALS — BP 132/95 | HR 87 | Temp 97.9°F | Ht 70.0 in | Wt 210.0 lb

## 2019-05-12 DIAGNOSIS — R109 Unspecified abdominal pain: Secondary | ICD-10-CM | POA: Diagnosis not present

## 2019-05-12 MED ORDER — SIMETHICONE 80 MG PO CHEW: 80.0000 mg | CHEWABLE_TABLET | Freq: Four times a day (QID) | ORAL | 0 refills | Status: DC | PRN

## 2019-05-12 NOTE — Progress Notes (Signed)
BP (!) 132/95 (BP Location: Left Arm, Patient Position: Sitting, Cuff Size: Normal)   Pulse 87   Temp 97.9 F (36.6 C) (Oral)   Ht 5' 10"  (1.778 m)   Wt 210 lb (95.3 kg)   SpO2 99%   BMI 30.13 kg/m    Subjective:    Patient ID: Bernard [name], male    DOB: 1975-06-28, 44 y.o.   MRN: [id number]  HPI: Bernard [name] is a 44 y.o. male presenting for abdominal pain and high blood pressure.  Chief Complaint  Patient presents with  . Abdominal Cramping    Ongoing appx 1 week. Nausea, no diarrhea or vomiting.  [name] Hypertension    Patient states his blood pressure has elevated over the last 2 days.   GASTROENTERITIS Abdominal cramping for about 1 week and worse last 2-3 days.  He states that when he lays down, it gets bad for ~10 minutes, goes away when he sleeps, and cranks back up when he wakes.  Diarrhea: no   Nausea: yes; worse than normal the last couple of days Vomiting: no Abdominal pain: yes; cramping Fever: no Decreased appetite: yes; eats food but not a lot or as much as normal.  Blood sugars: normal for patient Tolerating liquids: no Foreign travel: no Relevant dietary history: none applicable Similar illness in contacts: no Recent antibiotic use: no Status: worse Treatments attempted: zofran, increase water, small meals  HYPERTENSION Started taking Lisinopril on 04/26/2019.  BP ranged in 110s/70s.  Past couple of days, readings have been 130s/90s.  No vision changes, dizziness/lightheadednes, chest pain, shortness of breath, palpitations, or headaches.     No Known Allergies  Outpatient Encounter Medications as of 05/12/2019  Medication Sig Note  . ARIPiprazole (ABILIFY) 2 MG tablet TAKE 1 AND 1/2 TABLETS BY MOUTH DAILY.   [name] atorvastatin (LIPITOR) 40 MG tablet Take 1 tablet (40 mg total) by mouth daily.   . Cetirizine HCl 10 MG CAPS Take by mouth.   . CONTOUR NEXT TEST test strip    . famotidine (PEPCID) 20 MG tablet Take 1 tablet (20 mg total) by  mouth 2 (two) times daily.   [name] glucose blood (AGAMATRIX PRESTO TEST) test strip 1 each.   . insulin aspart (NOVOLOG) 100 UNIT/ML injection Up to 100 units in pump daily.   . Insulin Glargine (LANTUS SOLOSTAR) 100 UNIT/ML Solostar Pen 50 units daily when off pump   . Insulin Pen Needle 29G X 12.7MM MISC 1 each by Does not apply route 3 (three) times daily.   [name] lamoTRIgine (LAMICTAL) 25 MG tablet TAKE (2) TABLETS BY MOUTH EVERY DAY   . Lancets (ONETOUCH DELICA PLUS [id number]) MISC    . lisinopril (ZESTRIL) 10 MG tablet Take 1 tablet (10 mg total) by mouth daily.   . meloxicam (MOBIC) 15 MG tablet Take 1 tablet (15 mg total) by mouth daily.   . ondansetron (ZOFRAN ODT) 4 MG disintegrating tablet Take 1 tablet (4 mg total) by mouth every 8 (eight) hours as needed for nausea or vomiting.   . pantoprazole (PROTONIX) 40 MG tablet Take 1 tablet (40 mg total) by mouth daily.   . simethicone (GAS-X) 80 MG chewable tablet Chew 1 tablet (80 mg total) by mouth every 6 (six) hours as needed for flatulence (or cramping).   . [DISCONTINUED] diclofenac Sodium (VOLTAREN) 1 % GEL Apply 2 g topically 4 (four) times daily. (Patient not taking: Reported on 05/12/2019)   . [DISCONTINUED] insulin lispro (HUMALOG) 100 UNIT/ML injection  Use as directed in insulin pump. MDD: 90 units. E10.65 02/13/2016: Received from: [facility]   No facility-administered encounter medications on file as of 05/12/2019.   Patient Active Problem List   Diagnosis Date Noted  . Abdominal cramping 05/12/2019  . Tobacco use disorder 01/07/2019  . Bipolar disorder, in full remission, most recent episode hypomanic ([place]) 01/07/2019  . Bipolar 2 disorder ([place]) 10/09/2018  . GAD (generalized anxiety disorder) 10/09/2018  . Diabetic retinopathy associated with type 1 diabetes mellitus ([facility]) 09/15/2018  . Dupuytren's contracture of right hand 04/17/2018  . Shoulder pain, left 04/17/2018  . Hyperlipidemia due to type 1 diabetes  mellitus ([place]) 10/29/2016  . Gastroesophageal reflux disease without esophagitis 06/04/2016  . DM type 1 (diabetes mellitus, type 1) ([place]) 12/01/2014  . Depression, recurrent ([place])   . Hyperlipidemia   . Hypertension    Past Medical History:  Diagnosis Date  . Anxiety   . Depression   . Esophageal stricture   . Hyperlipidemia   . Hypertension   . Weight loss    Relevant past medical, surgical, family and social history reviewed and updated as indicated. Interim medical history since our last visit reviewed.  Review of Systems  Constitutional: Positive for appetite change. Negative for activity change, fatigue and fever.  Respiratory: Negative.  Negative for chest tightness and shortness of breath.   Cardiovascular: Negative.  Negative for chest pain and palpitations.  Gastrointestinal: Positive for abdominal pain and nausea. Negative for abdominal distention, blood in stool, constipation, diarrhea and vomiting.  Skin: Negative.  Negative for rash.  Neurological: Negative.  Negative for dizziness, weakness, light-headedness and headaches.  Psychiatric/Behavioral: Negative.  Negative for confusion and sleep disturbance. The patient is not nervous/anxious.     Per HPI unless specifically indicated above     Objective:    BP (!) 132/95 (BP Location: Left Arm, Patient Position: Sitting, Cuff Size: Normal)   Pulse 87   Temp 97.9 F (36.6 C) (Oral)   Ht 5' 10"  (1.778 m)   Wt 210 lb (95.3 kg)   SpO2 99%   BMI 30.13 kg/m   Wt Readings from Last 3 Encounters:  05/12/19 210 lb (95.3 kg)  04/20/19 204 lb 6.4 oz (92.7 kg)  04/17/18 200 lb (90.7 kg)    Physical Exam Vitals and nursing note reviewed.  Constitutional:      General: He is not in acute distress.    Appearance: Normal appearance. He is normal weight. He is not toxic-appearing.  Cardiovascular:     Rate and Rhythm: Normal rate.     Comments: Unable to evaluate heart sounds via telemedicine visit. Pulmonary:      Effort: No respiratory distress.     Comments: Unable to evaluate lung sounds via telemedicine visit. Abdominal:     General: Abdomen is flat. There is no distension.     Comments: Unable to palpate or auscultate via telemedicine visit.  Skin:    Coloration: Skin is not jaundiced or pale.  Neurological:     General: No focal deficit present.     Mental Status: He is alert and oriented to person, place, and time.     Motor: No weakness.  Psychiatric:        Mood and Affect: Mood normal.        Behavior: Behavior normal.        Thought Content: Thought content normal.        Judgment: Judgment normal.  Assessment & Plan:   Problem List Items Addressed This Visit      Other   Abdominal cramping - Primary    Acute, ongoing.  No red flags in history or examination and blood sugars have been normal.  Not likely due to newly-prescribed Lisinopril.  Will treat conservatively with simethicone, increase in water, increase physical activity with walking, and small meals.  Advised to follow up with [place] by end of week, if not better may consider abdominal ultrasound or further imaging.  With any new symptoms like vomiting or diarrhea, return to clinic.  With severe vomiting and unable to keep fluids down, go to ED.      Relevant Medications   simethicone (GAS-X) 80 MG chewable tablet       Follow up plan: Return if symptoms worsen or fail to improve.  Due to the catastrophic nature of the COVID-19 pandemic, this visit was completed via audio and visual contact via Mychart due to the restrictions of the COVID-19 pandemic. All issues as above were discussed and addressed. Physical exam was done as above through visual confirmation on Mychart. If it was felt that the patient should be evaluated in the office, they were directed there. The patient verbally consented to this visit."} . Location of the patient: home . Location of the provider: work . Those involved with this call:   . Provider: [name], DNP . CMA: Merilyn Baba, CMA . Front Desk/Registration: [facility]  . Time spent on call: 15 minutes on the phone discussing health concerns. 20 minutes total spent in review of patient's record and preparation of their chart.  I verified patient identity using two factors (patient name and date of birth). Patient consents verbally to being seen via telemedicine visit today.

## 2019-05-12 NOTE — Patient Instructions (Signed)
Abdominal Pain, Adult Pain in the abdomen (abdominal pain) can be caused by many things. Often, abdominal pain is not serious and it gets better with no treatment or by being treated at home. However, sometimes abdominal pain is serious. Your health care provider will ask questions about your medical history and do a physical exam to try to determine the cause of your abdominal pain. Follow these instructions at home:  Medicines  Take over-the-counter and prescription medicines only as told by your health care provider.  Do not take a laxative unless told by your health care provider. General instructions  Watch your condition for any changes.  Drink enough fluid to keep your urine pale yellow.  Keep all follow-up visits as told by your health care provider. This is important. Contact a health care provider if:  Your abdominal pain changes or gets worse.  You are not hungry or you lose weight without trying.  You are constipated or have diarrhea for more than 2-3 days.  You have pain when you urinate or have a bowel movement.  Your abdominal pain wakes you up at night.  Your pain gets worse with meals, after eating, or with certain foods.  You are vomiting and cannot keep anything down.  You have a fever.  You have blood in your urine. Get help right away if:  Your pain does not go away as soon as your health care provider told you to expect.  You cannot stop vomiting.  Your pain is only in areas of the abdomen, such as the right side or the left lower portion of the abdomen. Pain on the right side could be caused by appendicitis.  You have bloody or black stools, or stools that look like tar.  You have severe pain, cramping, or bloating in your abdomen.  You have signs of dehydration, such as: ? Dark urine, very little urine, or no urine. ? Cracked lips. ? Dry mouth. ? Sunken eyes. ? Sleepiness. ? Weakness.  You have trouble breathing or chest  pain. Summary  Often, abdominal pain is not serious and it gets better with no treatment or by being treated at home. However, sometimes abdominal pain is serious.  Watch your condition for any changes.  Take over-the-counter and prescription medicines only as told by your health care provider.  Contact a health care provider if your abdominal pain changes or gets worse.  Get help right away if you have severe pain, cramping, or bloating in your abdomen. This information is not intended to replace advice given to you by your health care provider. Make sure you discuss any questions you have with your health care provider. Document Revised: 06/02/2018 Document Reviewed: 06/02/2018 Elsevier Patient Education  [place].

## 2019-05-12 NOTE — Assessment & Plan Note (Addendum)
Acute, ongoing.  No red flags in history or examination and blood sugars have been normal.  Not likely due to newly-prescribed Lisinopril.  Will treat conservatively with simethicone, increase in water, increase physical activity with walking, and small meals.  Advised to follow up with [place] by end of week, if not better may consider abdominal ultrasound or further imaging.  With any new symptoms like vomiting or diarrhea, return to clinic.  With severe vomiting and unable to keep fluids down, go to ED.

## 2019-05-18 ENCOUNTER — Other Ambulatory Visit: Payer: Self-pay

## 2019-05-18 DIAGNOSIS — I10 Essential (primary) hypertension: Secondary | ICD-10-CM

## 2019-05-18 MED ORDER — LISINOPRIL 10 MG PO TABS: 10.0000 mg | ORAL_TABLET | Freq: Every day | ORAL | 0 refills | Status: DC

## 2019-05-18 NOTE — Telephone Encounter (Signed)
Patient last seen 04/30/19

## 2019-05-22 ENCOUNTER — Other Ambulatory Visit: Payer: Self-pay

## 2019-05-22 ENCOUNTER — Telehealth (INDEPENDENT_AMBULATORY_CARE_PROVIDER_SITE_OTHER): Payer: Managed Care, Other (non HMO) | Admitting: Nurse Practitioner

## 2019-05-22 ENCOUNTER — Encounter: Payer: Self-pay | Admitting: Nurse Practitioner

## 2019-05-22 VITALS — BP 116/79 | HR 84 | Ht 70.0 in | Wt 210.0 lb

## 2019-05-22 DIAGNOSIS — I10 Essential (primary) hypertension: Secondary | ICD-10-CM

## 2019-05-22 NOTE — Patient Instructions (Signed)
Lisinopril Tablets What is this medicine? LISINOPRIL (lyse IN oh pril) is an ACE inhibitor. It treats high blood pressure and heart failure. It can treat heart damage after a heart attack. This medicine may be used for other purposes; ask your health care provider or pharmacist if you have questions. COMMON BRAND NAME(S): Prinivil, Zestril What should I tell my health care provider before I take this medicine? They need to know if you have any of these conditions:  diabetes  heart or blood vessel disease  kidney disease  low blood pressure  previous swelling of the tongue, face, or lips with difficulty breathing, difficulty swallowing, hoarseness, or tightening of the throat  an unusual or allergic reaction to lisinopril, other ACE inhibitors, insect venom, foods, dyes, or preservatives  pregnant or trying to get pregnant  breast-feeding How should I use this medicine? Take this drug by mouth. Take it as directed on the prescription label at the same time every day. You can take it with or without food. If it upsets your stomach, take it with food. Keep taking it unless your health care provider tells you to stop. Talk to your health care provider about the use of this drug in children. While it may be prescribed for children as young as 6 for selected conditions, precautions do apply. Overdosage: If you think you have taken too much of this medicine contact a poison control center or emergency room at once. NOTE: This medicine is only for you. Do not share this medicine with others. What if I miss a dose? If you miss a dose, take it as soon as you can. If it is almost time for your next dose, take only that dose. Do not take double or extra doses. What may interact with this medicine? Do not take this medicine with any of the following medications:  hymenoptera venom  sacubitril; valsartan This medicines may also interact with the following  medications:  aliskiren  angiotensin receptor blockers, like losartan or valsartan  certain medicines for diabetes  diuretics  everolimus  gold compounds  lithium  NSAIDs, medicines for pain and inflammation, like ibuprofen or naproxen  potassium salts or supplements  salt substitutes  sirolimus  temsirolimus This list may not describe all possible interactions. Give your health care provider a list of all the medicines, herbs, non-prescription drugs, or dietary supplements you use. Also tell them if you smoke, drink alcohol, or use illegal drugs. Some items may interact with your medicine. What should I watch for while using this medicine? Visit your doctor or health care professional for regular check ups. Check your blood pressure as directed. Ask your doctor what your blood pressure should be, and when you should contact him or her. Do not treat yourself for coughs, colds, or pain while you are using this medicine without asking your doctor or health care professional for advice. Some ingredients may increase your blood pressure. Women should inform their doctor if they wish to become pregnant or think they might be pregnant. There is a potential for serious side effects to an unborn child. Talk to your health care professional or pharmacist for more information. Check with your doctor or health care professional if you get an attack of severe diarrhea, nausea and vomiting, or if you sweat a lot. The loss of too much body fluid can make it dangerous for you to take this medicine. You may get drowsy or dizzy. Do not drive, use machinery, or do anything that needs mental  alertness until you know how this drug affects you. Do not stand or sit up quickly, especially if you are an older patient. This reduces the risk of dizzy or fainting spells. Alcohol can make you more drowsy and dizzy. Avoid alcoholic drinks. Avoid salt substitutes unless you are told otherwise by your doctor or  health care professional. What side effects may I notice from receiving this medicine? Side effects that you should report to your doctor or health care professional as soon as possible:  allergic reactions like skin rash, itching or hives, swelling of the hands, feet, face, lips, throat, or tongue  breathing problems  signs and symptoms of kidney injury like trouble passing urine or change in the amount of urine  signs and symptoms of increased potassium like muscle weakness; chest pain; or fast, irregular heartbeat  signs and symptoms of liver injury like dark yellow or brown urine; general ill feeling or flu-like symptoms; light-colored stools; loss of appetite; nausea; right upper belly pain; unusually weak or tired; yellowing of the eyes or skin  signs and symptoms of low blood pressure like dizziness; feeling faint or lightheaded, falls; unusually weak or tired  stomach pain with or without nausea and vomiting Side effects that usually do not require medical attention (report to your doctor or health care professional if they continue or are bothersome):  changes in taste  cough  dizziness  fever  headache  sensitivity to light This list may not describe all possible side effects. Call your doctor for medical advice about side effects. You may report side effects to FDA at 1-800-FDA-1088. Where should I keep my medicine? Keep out of the reach of children and pets. Store at room temperature between 20 and 25 degrees C (68 and 77 degrees F). Protect from moisture. Keep the container tightly closed. Do not freeze. Avoid exposure to extreme heat. Throw away any unused drug after the expiration date. NOTE: This sheet is a summary. It may not cover all possible information. If you have questions about this medicine, talk to your doctor, pharmacist, or health care provider.  2020 Elsevier/Gold Standard (2018-08-27 11:38:35)

## 2019-05-22 NOTE — Assessment & Plan Note (Signed)
Chronic, stable.  BP well controlled at home today via virtual visit.  Continue lisinopril 10 mg daily for kidney protection.  Will recheck BMP to check electrolytes and kidney function.  Advised to call or return to clinic with any new symptoms or concerns, otherwise follow up in about 5 months.

## 2019-05-22 NOTE — Progress Notes (Signed)
BP 116/79 (BP Location: Left Arm, Patient Position: Sitting, Cuff Size: Normal)   Pulse 84   Ht 5' 10"  (1.778 m)   Wt 210 lb (95.3 kg)   SpO2 99%   BMI 30.13 kg/m    Subjective:    Patient ID: Bernard [name], male    DOB: 1975/08/27, 43 y.o.   MRN: [id number]  HPI: Bernard [name] is a 44 y.o. male presenting for BP follow up.  Chief Complaint  Patient presents with  . Hypertension    No complaints   HYPERTENSION Patient currently taking lisinopril 10 mg and reports doing very well on it. Hypertension status: controlled  Satisfied with current treatment? yes Duration of hypertension: chronic BP monitoring frequency:  daily BP range: 110s/70s-80s BP medication side effects:  no Medication compliance: excellent compliance Previous BP meds: amlodipine, switched to lisinopril for kidney protection Aspirin: no Recurrent headaches: no Visual changes: no Palpitations: no Dyspnea: no Chest pain: no Lower extremity edema: no Dizzy/lightheaded: no  No Known Allergies  Outpatient Encounter Medications as of 05/22/2019  Medication Sig  . ARIPiprazole (ABILIFY) 2 MG tablet TAKE 1 AND 1/2 TABLETS BY MOUTH DAILY.  [name] atorvastatin (LIPITOR) 40 MG tablet Take 1 tablet (40 mg total) by mouth daily.  . Cetirizine HCl 10 MG CAPS Take by mouth.  . CONTOUR NEXT TEST test strip   . famotidine (PEPCID) 20 MG tablet Take 1 tablet (20 mg total) by mouth 2 (two) times daily.  [name] glucose blood (AGAMATRIX PRESTO TEST) test strip 1 each.  . insulin aspart (NOVOLOG) 100 UNIT/ML injection Up to 100 units in pump daily.  . Insulin Glargine (LANTUS SOLOSTAR) 100 UNIT/ML Solostar Pen 50 units daily when off pump  . Insulin Pen Needle 29G X 12.7MM MISC 1 each by Does not apply route 3 (three) times daily.  [name] lamoTRIgine (LAMICTAL) 25 MG tablet TAKE (2) TABLETS BY MOUTH EVERY DAY  . Lancets (ONETOUCH DELICA PLUS [id number]) MISC   . lisinopril (ZESTRIL) 10 MG tablet Take 1 tablet (10 mg  total) by mouth daily.  . meloxicam (MOBIC) 15 MG tablet Take 1 tablet (15 mg total) by mouth daily.  . ondansetron (ZOFRAN ODT) 4 MG disintegrating tablet Take 1 tablet (4 mg total) by mouth every 8 (eight) hours as needed for nausea or vomiting.  . pantoprazole (PROTONIX) 40 MG tablet Take 1 tablet (40 mg total) by mouth daily.  . simethicone (GAS-X) 80 MG chewable tablet Chew 1 tablet (80 mg total) by mouth every 6 (six) hours as needed for flatulence (or cramping).   No facility-administered encounter medications on file as of 05/22/2019.   Patient Active Problem List   Diagnosis Date Noted  . Abdominal cramping 05/12/2019  . Tobacco use disorder 01/07/2019  . Bipolar disorder, in full remission, most recent episode hypomanic ([place]) 01/07/2019  . Bipolar 2 disorder ([place]) 10/09/2018  . GAD (generalized anxiety disorder) 10/09/2018  . Diabetic retinopathy associated with type 1 diabetes mellitus ([place]) 09/15/2018  . Dupuytren's contracture of right hand 04/17/2018  . Shoulder pain, left 04/17/2018  . Hyperlipidemia due to type 1 diabetes mellitus ([place]) 10/29/2016  . Gastroesophageal reflux disease without esophagitis 06/04/2016  . DM type 1 (diabetes mellitus, type 1) ([place]) 12/01/2014  . Depression, recurrent ([place])   . Hyperlipidemia   . Hypertension    Past Medical History:  Diagnosis Date  . Anxiety   . Depression   . Esophageal stricture   . Hyperlipidemia   . Hypertension   .  Weight loss    Relevant past medical, surgical, family and social history reviewed and updated as indicated. Interim medical history since our last visit reviewed.  Review of Systems  Constitutional: Negative.  Negative for activity change, appetite change, fatigue and fever.  Eyes: Negative.  Negative for visual disturbance.  Respiratory: Negative.  Negative for shortness of breath.   Cardiovascular: Negative.  Negative for chest pain, palpitations and leg swelling.  Gastrointestinal: Negative.    Skin: Negative.   Neurological: Negative.  Negative for dizziness, weakness, light-headedness and headaches.  Psychiatric/Behavioral: Negative.     Per HPI unless specifically indicated above     Objective:    BP 116/79 (BP Location: Left Arm, Patient Position: Sitting, Cuff Size: Normal)   Pulse 84   Ht 5' 10"  (1.778 m)   Wt 210 lb (95.3 kg)   SpO2 99%   BMI 30.13 kg/m   Wt Readings from Last 3 Encounters:  05/22/19 210 lb (95.3 kg)  05/12/19 210 lb (95.3 kg)  04/20/19 204 lb 6.4 oz (92.7 kg)    Physical Exam Vitals and nursing note reviewed.  Constitutional:      General: He is not in acute distress.    Appearance: Normal appearance.  Eyes:     General: No scleral icterus.    Extraocular Movements: Extraocular movements intact.  Cardiovascular:     Rate and Rhythm: Normal rate.     Comments: Unable to listen to heart sounds due to virtual visit Pulmonary:     Effort: Pulmonary effort is normal. No respiratory distress.     Comments: Unable to auscultate lung sounds due to virtual visit Skin:    Coloration: Skin is not jaundiced or pale.  Neurological:     General: No focal deficit present.     Mental Status: He is alert and oriented to person, place, and time.     Motor: No weakness.  Psychiatric:        Mood and Affect: Mood normal.        Behavior: Behavior normal.        Thought Content: Thought content normal.        Judgment: Judgment normal.    Results for orders placed or performed in visit on 04/20/19  HM DIABETES EYE EXAM  Result Value Ref Range   HM Diabetic Eye Exam Retinopathy (A) No Retinopathy      Assessment & Plan:   Problem List Items Addressed This Visit      Cardiovascular and Mediastinum   Hypertension - Primary    Chronic, stable.  BP well controlled at home today via virtual visit.  Continue lisinopril 10 mg daily for kidney protection.  Will recheck BMP to check electrolytes and kidney function.  Advised to call or return to  clinic with any new symptoms or concerns, otherwise follow up in about 5 months.      Relevant Orders   Basic Metabolic Panel (BMET)       Follow up plan: Return for as scheduled.  Due to the catastrophic nature of the COVID-19 pandemic, this visit was completed via audio and visual contact via Mychart due to the restrictions of the COVID-19 pandemic. All issues as above were discussed and addressed. Physical exam was done as above through visual confirmation on Mychart. If it was felt that the patient should be evaluated in the office, they were directed there. The patient verbally consented to this visit."} . Location of the patient: home . Location of the provider:  work . Those involved with this call:  . Provider: [name], DNP . CMA: Merilyn Baba, CMA . Front Desk/Registration: Jill Side  . Time spent on call: 5 minutes on the phone discussing health concerns. 10 minutes total spent in review of patient's record and preparation of their chart.  I verified patient identity using two factors (patient name and date of birth). Patient consents verbally to being seen via telemedicine visit today.

## 2019-06-17 ENCOUNTER — Other Ambulatory Visit: Payer: Self-pay

## 2019-06-17 DIAGNOSIS — I10 Essential (primary) hypertension: Secondary | ICD-10-CM

## 2019-06-17 MED ORDER — LISINOPRIL 10 MG PO TABS: 10.0000 mg | ORAL_TABLET | Freq: Every day | ORAL | 1 refills | Status: DC

## 2019-06-17 NOTE — Telephone Encounter (Signed)
Refill request for Lisinopril LOV: 05/22/2019 Next Appt: 10/23/2019

## 2019-07-07 ENCOUNTER — Other Ambulatory Visit: Payer: Self-pay | Admitting: Psychiatry

## 2019-07-07 DIAGNOSIS — F3181 Bipolar II disorder: Secondary | ICD-10-CM

## 2019-07-09 ENCOUNTER — Encounter: Payer: Self-pay | Admitting: Psychiatry

## 2019-07-09 ENCOUNTER — Other Ambulatory Visit: Payer: Self-pay

## 2019-07-09 ENCOUNTER — Telehealth (INDEPENDENT_AMBULATORY_CARE_PROVIDER_SITE_OTHER): Payer: 59 | Admitting: Psychiatry

## 2019-07-09 DIAGNOSIS — F411 Generalized anxiety disorder: Secondary | ICD-10-CM

## 2019-07-09 DIAGNOSIS — F3172 Bipolar disorder, in full remission, most recent episode hypomanic: Secondary | ICD-10-CM

## 2019-07-09 DIAGNOSIS — F17200 Nicotine dependence, unspecified, uncomplicated: Secondary | ICD-10-CM | POA: Diagnosis not present

## 2019-07-09 MED ORDER — ARIPIPRAZOLE 2 MG PO TABS: 1.0000 mg | ORAL_TABLET | Freq: Every day | ORAL | 2 refills | Status: DC

## 2019-07-09 NOTE — Progress Notes (Signed)
Provider Location : [facility] Patient Location : Home  Virtual Visit via Video Note  I connected with Bernard [name] on 07/09/19 at  4:00 PM EDT by a video enabled telemedicine application and verified that I am speaking with the correct person using two identifiers.   I discussed the limitations of evaluation and management by telemedicine and the availability of in person appointments. The patient expressed understanding and agreed to proceed.    I discussed the assessment and treatment plan with the patient. The patient was provided an opportunity to ask questions and all were answered. The patient agreed with the plan and demonstrated an understanding of the instructions.   The patient was advised to call back or seek an in-person evaluation if the symptoms worsen or if the condition fails to improve as anticipated.  [facility] MD OP Progress Note  07/09/2019 4:17 PM Saylor Luisantonio Adinolfi  MRN:  [id number]  Chief Complaint:  Chief Complaint    Follow-up     HPI: Jaysun Wessels is a 44 year old Caucasian male, employed, lives in [place], has a history of bipolar disorder, tobacco use disorder, anxiety disorder was evaluated by telemedicine today.  Patient today reports he is currently doing well with regards to his mood swings.  He denies any significant mood lability or irritability.  He reports he feels much better than what he may have felt in a long time.  He reports he is compliant on the medications as prescribed.  Denies side effects.  He reports sleep continues to be good.  Denies suicidality, homicidality or perceptual disturbances.  He reports work is going well.  He reports working from home can be challenging at times however he is doing well overall.  He reports he tried the Chantix to help him with smoking cessation however that made him nauseous.  He does struggle with chronic nausea however with the Chantix it made it worse.  He hence stopped using  it.  Patient reports he is more social and has been interacting with other people more than before.  He has joined a Tour manager and that is going well.  Patient reports he continues to be in therapy which is going well.  He is interested in possibly tapering off of the Abilify since he is doing well.      Visit Diagnosis:    ICD-10-CM   1. Bipolar disorder, in full remission, most recent episode hypomanic ([place])  F31.72 ARIPiprazole (ABILIFY) 2 MG tablet  2. GAD (generalized anxiety disorder)  F41.1   3. Tobacco use disorder  F17.200     Past Psychiatric History: I have reviewed past psychiatric history from my progress note on 02/18/2018.  Past trials of Abilify, risperidone, Wellbutrin  Past Medical History:  Past Medical History:  Diagnosis Date   Anxiety    Depression    Esophageal stricture    Hyperlipidemia    Hypertension    Weight loss     Past Surgical History:  Procedure Laterality Date   PILONIDAL CYST EXCISION     WISDOM TOOTH EXTRACTION      Family Psychiatric History: Reviewed family psychiatric history from my progress note on 02/18/2018  Family History:  Family History  Problem Relation Age of Onset   Cancer Mother    Anxiety disorder Mother    Depression Mother    Cancer Father    Anxiety disorder Brother    Depression Brother     Social History: Reviewed social history from my progress note on  02/18/2018 Social History   Socioeconomic History   Marital status: Married    Spouse name: joan    Number of children: 2   Years of education: Not on file   Highest education level: Bachelor's degree (e.g., BA, AB, BS)  Occupational History   Not on file  Tobacco Use   Smoking status: Current Every Day Smoker    Packs/day: 0.25    Types: Cigarettes    Start date: 01/22/2017   Smokeless tobacco: Never Used   Tobacco comment: restarted again- working on cutting back  Substance and Sexual Activity   Alcohol use: Yes     Alcohol/week: 2.0 - 6.0 standard drinks    Types: 2 - 6 Cans of beer per week    Comment: on occasion   Drug use: No   Sexual activity: Yes    Birth control/protection: None  Other Topics Concern   Not on file  Social History Narrative   Not on file   Social Determinants of Health   Financial Resource Strain:    Difficulty of Paying Living Expenses:   Food Insecurity:    Worried About Charity fundraiser in the Last Year:    Arboriculturist in the Last Year:   Transportation Needs:    Film/video editor (Medical):    Lack of Transportation (Non-Medical):   Physical Activity:    Days of Exercise per Week:    Minutes of Exercise per Session:   Stress:    Feeling of Stress :   Social Connections:    Frequency of Communication with Friends and Family:    Frequency of Social Gatherings with Friends and Family:    Attends Religious Services:    Active Member of Clubs or Organizations:    Attends Archivist Meetings:    Marital Status:     Allergies:  Allergies  Allergen Reactions   Chantix [Varenicline]     nausea    Metabolic Disorder Labs: Lab Results  Component Value Date   HGBA1C 8.8 (H) 07/11/2015   No results found for: PROLACTIN Lab Results  Component Value Date   CHOL 128 04/20/2019   TRIG 61 04/20/2019   HDL 54 04/20/2019   CHOLHDL 3.3 04/17/2018   VLDL 13 08/06/2016   LDLCALC 61 04/20/2019   LDLCALC 102 (H) 04/17/2018   Lab Results  Component Value Date   TSH 1.950 04/20/2019   TSH 2.180 04/17/2018    Therapeutic Level Labs: No results found for: LITHIUM No results found for: VALPROATE No components found for:  CBMZ  Current Medications: Current Outpatient Medications  Medication Sig Dispense Refill   ARIPiprazole (ABILIFY) 2 MG tablet Take 0.5 tablets (1 mg total) by mouth daily. 15 tablet 2   atorvastatin (LIPITOR) 40 MG tablet Take 1 tablet (40 mg total) by mouth daily. 90 tablet 1   Cetirizine HCl 10  MG CAPS Take by mouth.     CONTOUR NEXT TEST test strip      famotidine (PEPCID) 20 MG tablet Take 1 tablet (20 mg total) by mouth 2 (two) times daily. 180 tablet 1   glucose blood (AGAMATRIX PRESTO TEST) test strip 1 each.     insulin aspart (NOVOLOG) 100 UNIT/ML injection Up to 100 units in pump daily.     Insulin Glargine (LANTUS SOLOSTAR) 100 UNIT/ML Solostar Pen 50 units daily when off pump     Insulin Pen Needle 29G X 12.7MM MISC 1 each by Does not apply route 3 (  three) times daily. 100 each 12   lamoTRIgine (LAMICTAL) 25 MG tablet TAKE (2) TABLETS BY MOUTH EVERY DAY 60 tablet 1   Lancets (ONETOUCH DELICA PLUS [id number]) MISC      lisinopril (ZESTRIL) 10 MG tablet Take 1 tablet (10 mg total) by mouth daily. 90 tablet 1   meloxicam (MOBIC) 15 MG tablet Take 1 tablet (15 mg total) by mouth daily. 30 tablet 3   ondansetron (ZOFRAN ODT) 4 MG disintegrating tablet Take 1 tablet (4 mg total) by mouth every 8 (eight) hours as needed for nausea or vomiting. 30 tablet 0   pantoprazole (PROTONIX) 40 MG tablet Take 1 tablet (40 mg total) by mouth daily. 90 tablet 3   simethicone (GAS-X) 80 MG chewable tablet Chew 1 tablet (80 mg total) by mouth every 6 (six) hours as needed for flatulence (or cramping). 30 tablet 0   No current facility-administered medications for this visit.     Musculoskeletal: Strength & Muscle Tone: UTA Gait & Station: normal Patient leans: N/A  Psychiatric Specialty Exam: Review of Systems  Psychiatric/Behavioral: Negative for agitation, behavioral problems, confusion, decreased concentration, dysphoric mood, hallucinations, self-injury, sleep disturbance and suicidal ideas. The patient is not nervous/anxious and is not hyperactive.   All other systems reviewed and are negative.   There were no vitals taken for this visit.There is no height or weight on file to calculate BMI.  General Appearance: Casual  Eye Contact:  Fair  Speech:  Clear and Coherent   Volume:  Normal  Mood:  Euthymic  Affect:  Congruent  Thought Process:  Goal Directed and Descriptions of Associations: Intact  Orientation:  Full (Time, Place, and Person)  Thought Content: Logical   Suicidal Thoughts:  No  Homicidal Thoughts:  No  Memory:  Immediate;   Fair Recent;   Fair Remote;   Fair  Judgement:  Fair  Insight:  Fair  Psychomotor Activity:  Normal  Concentration:  Concentration: Fair and Attention Span: Fair  Recall:  [employer] of Knowledge: Fair  Language: Fair  Akathisia:  No  Handed:  Right  AIMS (if indicated): UTA  Assets:  Communication Skills Desire for Improvement Housing Intimacy Social Support Talents/Skills Transportation Vocational/Educational  ADL's:  Intact  Cognition: WNL  Sleep:  Fair   Screenings: PHQ2-9     Office Visit from 04/20/2019 in [facility] Visit from 01/05/2019 in [facility] Visit from 11/24/2018 in [facility] Visit from 04/17/2018 in [facility] Visit from 03/07/2017 in [facility]  PHQ-2 Total Score  1  0  0  0  0  PHQ-9 Total Score  7  0  --  --  --       Assessment and Plan: Owynn Mosqueda is a 44 year old male, employed, married, lives in [place], has a history of bipolar disorder, anxiety disorder, diabetes, hypertension, hyperlipidemia was evaluated by telemedicine today.  Patient is currently doing well on the current medication regimen.  Plan as noted below.  Plan Bipolar disorder in remission We will reduce Abilify to 1 mg p.o. daily for the next 4 weeks.  Discussed with patient to stop taking Abilify after 4 weeks if he continues to do well.  If however his mood symptoms get worse or if he does not tolerate the tapering process well, advised him to restart taking Abilify at the current dosage. Lamotrigine 50 mg p.o. daily  GAD-stable Continue CBT with Ms. Colleen Can  Tobacco use  disorder-unstable Provided smoking cessation counseling Discontinue Chantix-he did not tolerate it well.   Follow-up in clinic in 2 months or sooner if needed.  I have spent atleast 20 minutes non face to face with patient today. More than 50 % of the time was spent for preparing to see the patient ( e.g., review of test, records ), ordering medications and test ,psychoeducation and supportive psychotherapy and care coordination,as well as documenting clinical information in electronic health record. This note was generated in part or whole with voice recognition software. Voice recognition is usually quite accurate but there are transcription errors that can and very often do occur. I apologize for any typographical errors that were not detected and corrected.       Ursula Alert, MD 07/09/2019, 4:17 PM

## 2019-07-23 ENCOUNTER — Ambulatory Visit: Payer: Self-pay | Admitting: *Deleted

## 2019-07-23 NOTE — Telephone Encounter (Signed)
Fell and cut left shin 4 days ago on a piece of wood. 1"x1/2". He has cleaned the wound and applying antibiotic cream daily. Scabbing over with pink/red around the edges and feels sore. Does not feel warm to touch and no drainage. Care Advice reviewed. Tetanus up to date.   Reason for Disposition . Minor cut or scratch  Additional Information . Negative: [1] Has diabetes (diabetes mellitus) AND [2] minor cut or scratch on foot    Patient has DM but wound on his left shin and is healing appropriately.  Answer Assessment - Initial Assessment Questions 1. APPEARANCE of INJURY: "What does the injury look like?"      Scabbed over, red around the edges but not warm to touch. No drainage. 2. SIZE: "How large is the cut?"      1 inch by 1/2  3. BLEEDING: "Is it bleeding now?" If so, ask: "Is it difficult to stop?"      No, scabbed over. 4. LOCATION: "Where is the injury located?"      Left shin. 5. ONSET: "How long ago did the injury occur?"      4 days ago 6. MECHANISM: "Tell me how it happened."      [place] and hit  7. TETANUS: "When was the last tetanus booster?"     unsure 8. PREGNANCY: "Is there any chance you are pregnant?" "When was your last menstrual period?"     na  Protocols used: [place]

## 2019-08-06 ENCOUNTER — Telehealth: Payer: Self-pay

## 2019-08-06 DIAGNOSIS — F411 Generalized anxiety disorder: Secondary | ICD-10-CM

## 2019-08-06 DIAGNOSIS — F3172 Bipolar disorder, in full remission, most recent episode hypomanic: Secondary | ICD-10-CM

## 2019-08-06 MED ORDER — LAMOTRIGINE 100 MG PO TABS: 100.0000 mg | ORAL_TABLET | Freq: Every day | ORAL | 1 refills | Status: DC

## 2019-08-06 MED ORDER — LAMOTRIGINE 25 MG PO TABS: 75.0000 mg | ORAL_TABLET | Freq: Every day | ORAL | 0 refills | Status: DC

## 2019-08-06 NOTE — Telephone Encounter (Signed)
pt states that the medication changes not working for him he is having some side affect that you warned him about so he was wanted to know if you could change medicaiton. or increase lamictal

## 2019-08-06 NOTE — Telephone Encounter (Signed)
Patient reports since being off of the Abilify he has been feeling more irritable.  Will increase Lamictal to 75 mg p.o. daily for the next 2 weeks.  After 2 weeks he will start taking Lamictal 100 mg p.o. daily.  Discussed with patient to restart Abilify at a low dosage of 1 mg if he continues to struggle too much.  Discussed with patient to let writer know if I need to send Abilify 1 mg to the pharmacy at that point.

## 2019-09-10 ENCOUNTER — Telehealth: Payer: 59 | Admitting: Psychiatry

## 2019-09-25 LAB — HEMOGLOBIN A1C: Hemoglobin A1C: 6.6

## 2019-09-28 ENCOUNTER — Encounter: Payer: Self-pay | Admitting: Psychiatry

## 2019-09-28 ENCOUNTER — Other Ambulatory Visit: Payer: Self-pay | Admitting: Psychiatry

## 2019-09-28 ENCOUNTER — Telehealth (HOSPITAL_BASED_OUTPATIENT_CLINIC_OR_DEPARTMENT_OTHER): Payer: 59 | Admitting: Psychiatry

## 2019-09-28 ENCOUNTER — Other Ambulatory Visit: Payer: Self-pay

## 2019-09-28 DIAGNOSIS — F411 Generalized anxiety disorder: Secondary | ICD-10-CM | POA: Diagnosis not present

## 2019-09-28 DIAGNOSIS — T50905A Adverse effect of unspecified drugs, medicaments and biological substances, initial encounter: Secondary | ICD-10-CM | POA: Diagnosis not present

## 2019-09-28 DIAGNOSIS — F3172 Bipolar disorder, in full remission, most recent episode hypomanic: Secondary | ICD-10-CM | POA: Diagnosis not present

## 2019-09-28 DIAGNOSIS — F17200 Nicotine dependence, unspecified, uncomplicated: Secondary | ICD-10-CM

## 2019-09-28 MED ORDER — LAMOTRIGINE 25 MG PO TABS: 75.0000 mg | ORAL_TABLET | Freq: Every day | ORAL | 2 refills | Status: DC

## 2019-09-28 NOTE — Progress Notes (Signed)
Provider Location : [facility] Patient Location : Home  Participants: Patient , Provider  Virtual Visit via Video Note  I connected with Bernard [name] on 09/28/19 at  4:15 PM EDT by a video enabled telemedicine application and verified that I am speaking with the correct person using two identifiers.   I discussed the limitations of evaluation and management by telemedicine and the availability of in person appointments. The patient expressed understanding and agreed to proceed.    I discussed the assessment and treatment plan with the patient. The patient was provided an opportunity to ask questions and all were answered. The patient agreed with the plan and demonstrated an understanding of the instructions.   The patient was advised to call back or seek an in-person evaluation if the symptoms worsen or if the condition fails to improve as anticipated.  [place] MD OP Progress Note  09/28/2019 5:36 PM Bernard [name]  MRN:  [id number]  Chief Complaint:  Chief Complaint    Follow-up     HPI: Bernard [name] is a 44 year old Caucasian male, employed, lives in [place], has a history of bipolar disorder, tobacco use disorder, anxiety disorder was evaluated by telemedicine today.  Patient today reports he developed side effects to the Lamictal.  Since going up on the Lamictal to 100 mg he started noticing restlessness at night.  He reports this happens only at bedtime and hence this affected his sleep.  He hence stopped the Lamictal last night and he was able to sleep well without any restlessness.  That is when he realized the Lamictal was causing it.  This started only when he went up on the Lamictal to 100 mg.  He did not have this problem when he was on 75 mg p.o. daily.  Patient reports he is currently not on the Abilify.  Patient reports work is going well.  Work is stressful on and off.  He however is coping okay.  He does report since stopping the Lamictal last night he  has noticed some irritability and anxiety today however so far he has been coping well.  He continues to follow-up with his therapist.  He is trying to cut back on smoking.  Patient denies any suicidality, homicidality or perceptual disturbances.  Patient denies any other concerns today.  Visit Diagnosis:    ICD-10-CM   1. Bipolar disorder, in full remission, most recent episode hypomanic ([place])  F31.72 lamoTRIgine (LAMICTAL) 25 MG tablet  2. GAD (generalized anxiety disorder)  F41.1   3. Adverse effect of drug, initial encounter  T50.905A   4. Tobacco use disorder  F17.200     Past Psychiatric History: I have reviewed past psychiatric history from my progress note on 02/18/2018.  Past trials of Abilify, risperidone, Wellbutrin  Past Medical History:  Past Medical History:  Diagnosis Date  . Anxiety   . Depression   . Esophageal stricture   . Hyperlipidemia   . Hypertension   . Weight loss     Past Surgical History:  Procedure Laterality Date  . PILONIDAL CYST EXCISION    . WISDOM TOOTH EXTRACTION      Family Psychiatric History: I have reviewed family psychiatric history from my progress note on 02/18/2018  Family History:  Family History  Problem Relation Age of Onset  . Cancer Mother   . Anxiety disorder Mother   . Depression Mother   . Cancer Father   . Anxiety disorder Brother   . Depression Brother  Social History: Reviewed social history from my progress note on 02/18/2018 Social History   Socioeconomic History  . Marital status: Married    Spouse name: joan   . Number of children: 2  . Years of education: Not on file  . Highest education level: Bachelor's degree (e.g., BA, AB, BS)  Occupational History  . Not on file  Tobacco Use  . Smoking status: Current Every Day Smoker    Packs/day: 0.25    Types: Cigarettes    Start date: 01/22/2017  . Smokeless tobacco: Never Used  . Tobacco comment: restarted again- working on cutting back  Vaping Use   . Vaping Use: Never used  Substance and Sexual Activity  . Alcohol use: Yes    Alcohol/week: 2.0 - 6.0 standard drinks    Types: 2 - 6 Cans of beer per week    Comment: on occasion  . Drug use: No  . Sexual activity: Yes    Birth control/protection: None  Other Topics Concern  . Not on file  Social History Narrative  . Not on file   Social Determinants of Health   Financial Resource Strain:   . Difficulty of Paying Living Expenses: Not on file  Food Insecurity:   . Worried About Charity fundraiser in the Last Year: Not on file  . Ran Out of Food in the Last Year: Not on file  Transportation Needs:   . Lack of Transportation (Medical): Not on file  . Lack of Transportation (Non-Medical): Not on file  Physical Activity:   . Days of Exercise per Week: Not on file  . Minutes of Exercise per Session: Not on file  Stress:   . Feeling of Stress : Not on file  Social Connections:   . Frequency of Communication with Friends and Family: Not on file  . Frequency of Social Gatherings with Friends and Family: Not on file  . Attends Religious Services: Not on file  . Active Member of Clubs or Organizations: Not on file  . Attends Archivist Meetings: Not on file  . Marital Status: Not on file    Allergies:  Allergies  Allergen Reactions  . Chantix [Varenicline]     nausea    Metabolic Disorder Labs: Lab Results  Component Value Date   HGBA1C 8.8 (H) 07/11/2015   No results found for: PROLACTIN Lab Results  Component Value Date   CHOL 128 04/20/2019   TRIG 61 04/20/2019   HDL 54 04/20/2019   CHOLHDL 3.3 04/17/2018   VLDL 13 08/06/2016   LDLCALC 61 04/20/2019   LDLCALC 102 (H) 04/17/2018   Lab Results  Component Value Date   TSH 1.950 04/20/2019   TSH 2.180 04/17/2018    Therapeutic Level Labs: No results found for: LITHIUM No results found for: VALPROATE No components found for:  CBMZ  Current Medications: Current Outpatient Medications   Medication Sig Dispense Refill  . atorvastatin (LIPITOR) 40 MG tablet Take 1 tablet (40 mg total) by mouth daily. 90 tablet 1  . Cetirizine HCl 10 MG CAPS Take by mouth.    . Continuous Blood Gluc Transmit (DEXCOM G6 TRANSMITTER) MISC USE 1 EACH EVERY 3 (THREE) MONTHS    . CONTOUR NEXT TEST test strip     . famotidine (PEPCID) 20 MG tablet Take 1 tablet (20 mg total) by mouth 2 (two) times daily. 180 tablet 1  . glucose blood (AGAMATRIX PRESTO TEST) test strip 1 each.    . insulin aspart (NOVOLOG)  100 UNIT/ML injection Up to 100 units in pump daily.    . Insulin Glargine (LANTUS SOLOSTAR) 100 UNIT/ML Solostar Pen 50 units daily when off pump    . Insulin Pen Needle 29G X 12.7MM MISC 1 each by Does not apply route 3 (three) times daily. 100 each 12  . lamoTRIgine (LAMICTAL) 25 MG tablet Take 3 tablets (75 mg total) by mouth daily. 90 tablet 2  . Lancets (ONETOUCH DELICA PLUS [id number]) MISC     . lisinopril (ZESTRIL) 10 MG tablet Take 1 tablet (10 mg total) by mouth daily. 90 tablet 1  . meloxicam (MOBIC) 15 MG tablet Take 1 tablet (15 mg total) by mouth daily. 30 tablet 3  . ondansetron (ZOFRAN ODT) 4 MG disintegrating tablet Take 1 tablet (4 mg total) by mouth every 8 (eight) hours as needed for nausea or vomiting. 30 tablet 0  . pantoprazole (PROTONIX) 40 MG tablet Take 1 tablet (40 mg total) by mouth daily. 90 tablet 3  . ranitidine (ZANTAC) 150 MG capsule Take by mouth.    . simethicone (GAS-X) 80 MG chewable tablet Chew 1 tablet (80 mg total) by mouth every 6 (six) hours as needed for flatulence (or cramping). 30 tablet 0   No current facility-administered medications for this visit.     Musculoskeletal: Strength & Muscle Tone: UTA Gait & Station: normal Patient leans: N/A  Psychiatric Specialty Exam: Review of Systems  Psychiatric/Behavioral: The patient is nervous/anxious.        Irritable  All other systems reviewed and are negative.   There were no vitals taken for this  visit.There is no height or weight on file to calculate BMI.  General Appearance: Casual  Eye Contact:  Fair  Speech:  Normal Rate  Volume:  Normal  Mood:  Irritable, anxious- coping well  Affect:  Congruent  Thought Process:  Goal Directed and Descriptions of Associations: Intact  Orientation:  Full (Time, Place, and Person)  Thought Content: Logical   Suicidal Thoughts:  No  Homicidal Thoughts:  No  Memory:  Immediate;   Fair Recent;   Fair Remote;   Fair  Judgement:  Fair  Insight:  Fair  Psychomotor Activity:  Normal  Concentration:  Concentration: Fair and Attention Span: Fair  Recall:  [employer] of Knowledge: Fair  Language: Fair  Akathisia:  No  Handed:  Right  AIMS (if indicated): UTA  Assets:  Communication Skills Desire for Improvement Housing Intimacy Social Support Talents/Skills Transportation  ADL's:  Intact  Cognition: WNL  Sleep:  Fair   Screenings: PHQ2-9     Office Visit from 04/20/2019 in [facility] Visit from 01/05/2019 in [facility] Visit from 11/24/2018 in [facility] Visit from 04/17/2018 in [facility] Visit from 03/07/2017 in [place]  PHQ-2 Total Score 1 0 0 0 0  PHQ-9 Total Score 7 0 -- -- --       Assessment and Plan: Bernard [name] is a 44 year old male, employed, married, lives in [place], has a history of bipolar disorder, anxiety disorder, diabetes, hypertension, hyperlipidemia was evaluated by telemedicine today.  Patient is currently struggling with possible side effects to higher dosage of Lamictal.  Discussed plan as noted below.  Plan Bipolar disorder in remission Continue Lamictal however will reduce the dosage to 75 mg p.o. daily  Adverse side effect to medication-unstable Discontinue Lamictal 100 mg p.o. daily Start Lamictal 75 mg p.o. daily.  Discussed with patient  to take the Lamictal in divided dosage of 25 mg 3 times a  day or 50 mg every morning and 25 mg q. noon.  GAD-stable Continue CBT with Ms. Colleen Can  Tobacco use disorder-improving Provided smoking cessation counseling.  Follow-up in clinic in 4 to 5 weeks or sooner if needed.  I have spent atleast 20 minutes face to face with patient today. More than 50 % of the time was spent for preparing to see the patient ( e.g., review of test, records ),  ordering medications and test ,psychoeducation and supportive psychotherapy and care coordination,as well as documenting clinical information in electronic health record. This note was generated in part or whole with voice recognition software. Voice recognition is usually quite accurate but there are transcription errors that can and very often do occur. I apologize for any typographical errors that were not detected and corrected.        Ursula Alert, MD 09/28/2019, 5:36 PM

## 2019-09-29 ENCOUNTER — Other Ambulatory Visit: Payer: Self-pay | Admitting: Psychiatry

## 2019-09-29 DIAGNOSIS — F3172 Bipolar disorder, in full remission, most recent episode hypomanic: Secondary | ICD-10-CM

## 2019-10-05 ENCOUNTER — Ambulatory Visit: Payer: Self-pay

## 2019-10-05 ENCOUNTER — Other Ambulatory Visit: Payer: Self-pay

## 2019-10-05 ENCOUNTER — Ambulatory Visit (INDEPENDENT_AMBULATORY_CARE_PROVIDER_SITE_OTHER): Payer: Managed Care, Other (non HMO) | Admitting: Family Medicine

## 2019-10-05 ENCOUNTER — Encounter: Payer: Self-pay | Admitting: Family Medicine

## 2019-10-05 VITALS — BP 119/83 | HR 92 | Temp 99.0°F | Wt 198.0 lb

## 2019-10-05 DIAGNOSIS — R1033 Periumbilical pain: Secondary | ICD-10-CM | POA: Diagnosis not present

## 2019-10-05 LAB — CBC WITH DIFFERENTIAL/PLATELET
Hematocrit: 44 % (ref 37.5–51.0)
Hemoglobin: 15.3 g/dL (ref 13.0–17.7)
Lymphocytes Absolute: 1.7 10*3/uL (ref 0.7–3.1)
Lymphs: 20 %
MCH: 30.8 pg (ref 26.6–33.0)
MCHC: 34.8 g/dL (ref 31.5–35.7)
MCV: 89 fL (ref 79–97)
MID (Absolute): 0.6 10*3/uL (ref 0.1–1.6)
MID: 7 %
Neutrophils Absolute: 6.3 10*3/uL (ref 1.4–7.0)
Neutrophils: 73 %
Platelets: 201 10*3/uL (ref 150–450)
RBC: 4.96 x10E6/uL (ref 4.14–5.80)
RDW: 13 % (ref 11.6–15.4)
WBC: 8.6 10*3/uL (ref 3.4–10.8)

## 2019-10-05 NOTE — Progress Notes (Signed)
BP 119/83 (BP Location: Right Arm, Patient Position: Sitting, Cuff Size: Normal)   Pulse 92   Temp 99 F (37.2 C) (Oral)   Wt 198 lb (89.8 kg)   SpO2 98%   BMI 28.41 kg/m    Subjective:    Patient ID: Bernard [name], male    DOB: [birth year], 44 y.o.   MRN: [id number]  HPI: Bernard [name] is a 44 y.o. male  Chief Complaint  Patient presents with  . Abdominal Pain   SKIN INFECTION- when he moved his unit, he didn't notice anything abnormal, but didn't look closely Duration: 2 days Location: 4 inches from umbilicus at site of previous insertion for insulin pump History of trauma in area: yes Pain: yes Quality: sharp Severity: 7/10 with movement, very short, no pain usually Redness: yes Swelling: no Oozing: no Pus: no Fevers: yes Nausea/vomiting: yes Status: worse Treatments attempted:none  Tetanus: UTD  Relevant past medical, surgical, family and social history reviewed and updated as indicated. Interim medical history since our last visit reviewed. Allergies and medications reviewed and updated.  Review of Systems  Constitutional: Negative.   Respiratory: Negative.   Cardiovascular: Negative.   Gastrointestinal: Positive for abdominal pain. Negative for abdominal distention, anal bleeding, blood in stool, constipation, diarrhea, nausea, rectal pain and vomiting.  Musculoskeletal: Negative.   Skin: Positive for wound. Negative for color change, pallor and rash.  Neurological: Negative.   Psychiatric/Behavioral: Negative.     Per HPI unless specifically indicated above     Objective:    BP 119/83 (BP Location: Right Arm, Patient Position: Sitting, Cuff Size: Normal)   Pulse 92   Temp 99 F (37.2 C) (Oral)   Wt 198 lb (89.8 kg)   SpO2 98%   BMI 28.41 kg/m   Wt Readings from Last 3 Encounters:  10/05/19 198 lb (89.8 kg)  05/22/19 210 lb (95.3 kg)  05/12/19 210 lb (95.3 kg)    Physical Exam Vitals and nursing note reviewed.    Constitutional:      General: He is not in acute distress.    Appearance: Normal appearance. He is not ill-appearing, toxic-appearing or diaphoretic.  HENT:     Head: Normocephalic and atraumatic.     Right Ear: External ear normal.     Left Ear: External ear normal.     Nose: Nose normal.     Mouth/Throat:     Mouth: Mucous membranes are moist.     Pharynx: Oropharynx is clear.  Eyes:     General: No scleral icterus.       Right eye: No discharge.        Left eye: No discharge.     Extraocular Movements: Extraocular movements intact.     Conjunctiva/sclera: Conjunctivae normal.     Pupils: Pupils are equal, round, and reactive to light.  Cardiovascular:     Rate and Rhythm: Normal rate and regular rhythm.     Pulses: Normal pulses.     Heart sounds: Normal heart sounds. No murmur heard.  No friction rub. No gallop.   Pulmonary:     Effort: Pulmonary effort is normal. No respiratory distress.     Breath sounds: Normal breath sounds. No stridor. No wheezing, rhonchi or rales.  Chest:     Chest wall: No tenderness.  Abdominal:     General: Abdomen is flat. Bowel sounds are normal. There is no distension or abdominal bruit. There are no signs of injury.     Palpations: Abdomen  is soft. There is no shifting dullness, fluid wave, hepatomegaly, splenomegaly, mass or pulsatile mass.     Comments: Small pinpoint of redness on R abdomen about 4 inches to the R and inferior to the umbilicus. Small subcutaneous nodule felt. No heat, no erythema, no pain  Musculoskeletal:        General: Normal range of motion.     Cervical back: Normal range of motion and neck supple.  Skin:    General: Skin is warm and dry.     Capillary Refill: Capillary refill takes less than 2 seconds.     Coloration: Skin is not jaundiced or pale.     Findings: No bruising, erythema, lesion or rash.  Neurological:     General: No focal deficit present.     Mental Status: He is alert and oriented to person,  place, and time. Mental status is at baseline.  Psychiatric:        Mood and Affect: Mood normal.        Behavior: Behavior normal.        Thought Content: Thought content normal.        Judgment: Judgment normal.     Results for orders placed or performed in visit on 10/05/19  Hemoglobin A1c  Result Value Ref Range   Hemoglobin A1C 6.6       Assessment & Plan:   Problem List Items Addressed This Visit    None    Visit Diagnoses    Periumbilical abdominal pain    -  Primary   Concern for air pocket vs infection vs retained part of dexcom. Will check CBC. Warm compresses. If not better by tmrw, will obtain US to r/o retained material   Relevant Orders   CBC With Differential/Platelet       Follow up plan: Return if symptoms worsen or fail to improve.

## 2019-10-05 NOTE — Telephone Encounter (Signed)
Pt. Reports he started having abdominal pain at his previous insertion site for insulin pump. Right side, 4 inches from umbilicus.Started Saturday. Pinpoint area of redness. Hurts with movement or coughing. No swelling. Pain 7/10.No nausea, vomiting, diarrhea.Appointment for today.  Bernard [name]   [facility] Male, 44 y.o., Jul 08, 1975 MRN:  [id number] Phone:  [phone] [name]) PCP:  Guadalupe Maple, MD Primary Cvg:  None Next Appt With Family Medicine 10/23/2019 at 9:20 AM Message from Sharene Skeans sent at 10/05/2019 8:04 AM EDT  Summary: insulin pump    Pt has an insulin pump and pt feels the device has caused an injury to his belly. On the site where pump attaches it is painful and sensitive / Pt cant cough or laugh or bend/ please advise         Call History   Type Contact Phone  10/05/2019 08:02 AM EDT Phone (Incoming) Nikki, [place] [facility] (Self) [phone] (H)  User: [name] E    Reason for Disposition . [1] MODERATE pain (e.g., interferes with normal activities) AND [2] pain comes and goes (cramps) AND [3] present > 24 hours  (Exception: pain with Vomiting or Diarrhea - see that Guideline)  Answer Assessment - Initial Assessment Questions 1. LOCATION: "Where does it hurt?"      Right 4 inches from naval 2. RADIATION: "Does the pain shoot anywhere else?" (e.g., chest, back)     No 3. ONSET: "When did the pain begin?" (Minutes, hours or days ago)      Saturday 4. SUDDEN: "Gradual or sudden onset?"     Gradual 5. PATTERN "Does the pain come and go, or is it constant?"    - If constant: "Is it getting better, staying the same, or worsening?"      (Note: Constant means the pain never goes away completely; most serious pain is constant and it progresses)     - If intermittent: "How long does it last?" "Do you have pain now?"     (Note: Intermittent means the pain goes away completely between bouts)     Hurts with movement, cough 6. SEVERITY: "How bad is the  pain?"  (e.g., Scale 1-10; mild, moderate, or severe)    - MILD (1-3): doesn't interfere with normal activities, abdomen soft and not tender to touch     - MODERATE (4-7): interferes with normal activities or awakens from sleep, tender to touch     - SEVERE (8-10): excruciating pain, doubled over, unable to do any normal activities       7 7. RECURRENT SYMPTOM: "Have you ever had this type of stomach pain before?" If Yes, ask: "When was the last time?" and "What happened that time?"      No 8. CAUSE: "What do you think is causing the stomach pain?"     Unsure 9. RELIEVING/AGGRAVATING FACTORS: "What makes it better or worse?" (e.g., movement, antacids, bowel movement)     Being still 10. OTHER SYMPTOMS: "Has there been any vomiting, diarrhea, constipation, or urine problems?"       No  Protocols used: ABDOMINAL PAIN - MALE-A-AH

## 2019-10-06 ENCOUNTER — Telehealth: Payer: Self-pay | Admitting: Family Medicine

## 2019-10-06 DIAGNOSIS — R1033 Periumbilical pain: Secondary | ICD-10-CM

## 2019-10-06 NOTE — Telephone Encounter (Signed)
Patient would like to proceed with [place] discussed at visit.

## 2019-10-06 NOTE — Telephone Encounter (Signed)
Copied from [place] [phone]. Topic: Referral - Request for Referral >> Oct 06, 2019  2:48 PM Hinda Lenis D wrote:   Has patient seen PCP for this complaint? YES *If NO, is insurance requiring patient see PCP for this issue before PCP can refer them? Referral for which specialty:  Preferred provider/office: [facility]  Reason for referral: [facility]

## 2019-10-06 NOTE — Telephone Encounter (Signed)
Order in.

## 2019-10-07 ENCOUNTER — Other Ambulatory Visit: Payer: Self-pay

## 2019-10-07 MED ORDER — ONDANSETRON 4 MG PO TBDP: 4.0000 mg | ORAL_TABLET | Freq: Three times a day (TID) | ORAL | 0 refills | Status: DC | PRN

## 2019-10-07 NOTE — Telephone Encounter (Signed)
Patient notified that order has been entered.

## 2019-10-08 ENCOUNTER — Other Ambulatory Visit: Payer: Self-pay

## 2019-10-08 ENCOUNTER — Telehealth: Payer: Self-pay | Admitting: Family Medicine

## 2019-10-08 ENCOUNTER — Ambulatory Visit
Admission: RE | Admit: 2019-10-08 | Discharge: 2019-10-08 | Disposition: A | Discharge status: Home / Self Care | Payer: Managed Care, Other (non HMO) | Source: Ambulatory Visit | Attending: Family Medicine | Admitting: Family Medicine

## 2019-10-08 DIAGNOSIS — R1033 Periumbilical pain: Secondary | ICD-10-CM | POA: Insufficient documentation

## 2019-10-08 NOTE — Telephone Encounter (Signed)
Copied from [place] [phone]. Topic: Referral - Request for Referral >> Oct 06, 2019  2:48 PM Hinda Lenis D wrote:   Has patient seen PCP for this complaint? YES *If NO, is insurance requiring patient see PCP for this issue before PCP can refer them? Referral for which specialty:  Preferred provider/office: [facility]  Reason for referral: ULTRASOUND >> Oct 08, 2019 10:56 AM Percell Belt A wrote: Pt called in and would like to see if he could get the ultrasound results before the weekend.  He is still hurting and would like to address this before the long weekend  Best number [phone]

## 2019-10-08 NOTE — Telephone Encounter (Signed)
Order has already been placed- please check on status of [place]

## 2019-10-08 NOTE — Telephone Encounter (Signed)
Patient had [place] this morning according to chart but it doesn't look like it has been read and resulted yet. Routing to provider for results when they are available.

## 2019-10-23 ENCOUNTER — Ambulatory Visit: Payer: Managed Care, Other (non HMO) | Admitting: Family Medicine

## 2019-11-02 ENCOUNTER — Encounter: Payer: Self-pay | Admitting: Family Medicine

## 2019-11-02 ENCOUNTER — Other Ambulatory Visit: Payer: Self-pay

## 2019-11-02 ENCOUNTER — Ambulatory Visit (INDEPENDENT_AMBULATORY_CARE_PROVIDER_SITE_OTHER): Payer: Managed Care, Other (non HMO) | Admitting: Family Medicine

## 2019-11-02 VITALS — BP 131/86 | HR 103 | Temp 98.6°F | Wt 198.0 lb

## 2019-11-02 DIAGNOSIS — Z23 Encounter for immunization: Secondary | ICD-10-CM | POA: Diagnosis not present

## 2019-11-02 DIAGNOSIS — E7800 Pure hypercholesterolemia, unspecified: Secondary | ICD-10-CM | POA: Diagnosis not present

## 2019-11-02 DIAGNOSIS — E1069 Type 1 diabetes mellitus with other specified complication: Secondary | ICD-10-CM

## 2019-11-02 DIAGNOSIS — I10 Essential (primary) hypertension: Secondary | ICD-10-CM | POA: Diagnosis not present

## 2019-11-02 DIAGNOSIS — G2581 Restless legs syndrome: Secondary | ICD-10-CM

## 2019-11-02 DIAGNOSIS — Z1159 Encounter for screening for other viral diseases: Secondary | ICD-10-CM

## 2019-11-02 DIAGNOSIS — E109 Type 1 diabetes mellitus without complications: Secondary | ICD-10-CM

## 2019-11-02 DIAGNOSIS — E785 Hyperlipidemia, unspecified: Secondary | ICD-10-CM

## 2019-11-02 NOTE — Assessment & Plan Note (Signed)
Under good control on current regimen. Continue current regimen. Continue to monitor. Call with any concerns. Refills given. Labs drawn today.   

## 2019-11-02 NOTE — Assessment & Plan Note (Signed)
Continue to follow with endocrinology. Call with any concerns.

## 2019-11-02 NOTE — Progress Notes (Signed)
BP 131/86   Pulse (!) 103   Temp 98.6 F (37 C) (Oral)   Wt 198 lb (89.8 kg)   SpO2 97%   BMI 28.41 kg/m    Subjective:    Patient ID: Bernard [name], male    DOB: October 21, 1975, 44 y.o.   MRN: [id number]  HPI: Bernard [name] is a 44 y.o. male  Chief Complaint  Patient presents with  . Hypertension  . Hyperlipidemia   HYPERTENSION / HYPERLIPIDEMIA Satisfied with current treatment? yes Duration of hypertension: chronic BP monitoring frequency: not checking BP medication side effects: no Past BP meds: lisinopril Duration of hyperlipidemia: chronic Cholesterol medication side effects: no Cholesterol supplements: none Past cholesterol medications: atorvastatin Medication compliance: excellent compliance Aspirin: no Recent stressors: no Recurrent headaches: no Visual changes: no Palpitations: no Dyspnea: no Chest pain: no Lower extremity edema: no Dizzy/lightheaded: no  RESTLESS LEGS Duration: couple of months- around the time his lamictal was increased Discomfort description:  Creeping and crawling Pain: no Location: lower legs Bilateral: yes Symmetric: yes Severity: moderate Onset:  sudden Frequency:  intermittent Symptoms only occur while legs at rest: yes Sudden unintentional leg jerking: no Bed partner bothered by leg movements: no LE numbness: no Decreased sensation: no Weakness: no Insomnia: no Daytime somnolence: no Fatigue: no Status: better   Relevant past medical, surgical, family and social history reviewed and updated as indicated. Interim medical history since our last visit reviewed. Allergies and medications reviewed and updated.  Review of Systems  Constitutional: Negative.   Respiratory: Negative.   Cardiovascular: Negative.   Gastrointestinal: Negative.   Musculoskeletal: Negative.   Neurological: Negative.   Psychiatric/Behavioral: Negative.     Per HPI unless specifically indicated above     Objective:      BP 131/86   Pulse (!) 103   Temp 98.6 F (37 C) (Oral)   Wt 198 lb (89.8 kg)   SpO2 97%   BMI 28.41 kg/m   Wt Readings from Last 3 Encounters:  11/02/19 198 lb (89.8 kg)  10/05/19 198 lb (89.8 kg)  05/22/19 210 lb (95.3 kg)    Physical Exam Vitals and nursing note reviewed.  Constitutional:      General: He is not in acute distress.    Appearance: Normal appearance. He is not ill-appearing, toxic-appearing or diaphoretic.  HENT:     Head: Normocephalic and atraumatic.     Right Ear: External ear normal.     Left Ear: External ear normal.     Nose: Nose normal.     Mouth/Throat:     Mouth: Mucous membranes are moist.     Pharynx: Oropharynx is clear.  Eyes:     General: No scleral icterus.       Right eye: No discharge.        Left eye: No discharge.     Extraocular Movements: Extraocular movements intact.     Conjunctiva/sclera: Conjunctivae normal.     Pupils: Pupils are equal, round, and reactive to light.  Cardiovascular:     Rate and Rhythm: Normal rate and regular rhythm.     Pulses: Normal pulses.     Heart sounds: Normal heart sounds. No murmur heard.  No friction rub. No gallop.   Pulmonary:     Effort: Pulmonary effort is normal. No respiratory distress.     Breath sounds: Normal breath sounds. No stridor. No wheezing, rhonchi or rales.  Chest:     Chest wall: No tenderness.  Musculoskeletal:  General: Normal range of motion.     Cervical back: Normal range of motion and neck supple.  Skin:    General: Skin is warm and dry.     Capillary Refill: Capillary refill takes less than 2 seconds.     Coloration: Skin is not jaundiced or pale.     Findings: No bruising, erythema, lesion or rash.  Neurological:     General: No focal deficit present.     Mental Status: He is alert and oriented to person, place, and time. Mental status is at baseline.  Psychiatric:        Mood and Affect: Mood normal.        Behavior: Behavior normal.        Thought  Content: Thought content normal.        Judgment: Judgment normal.     Results for orders placed or performed in visit on 10/05/19  Hemoglobin A1c  Result Value Ref Range   Hemoglobin A1C 6.6   CBC With Differential/Platelet  Result Value Ref Range   WBC 8.6 3.4 - 10.8 x10E3/uL   RBC 4.96 4.14 - 5.80 x10E6/uL   Hemoglobin 15.3 13.0 - 17.7 g/dL   Hematocrit 44.0 37.5 - 51.0 %   MCV 89 79 - 97 fL   MCH 30.8 26.6 - 33.0 pg   MCHC 34.8 31 - 35 g/dL   RDW 13.0 11.6 - 15.4 %   Platelets 201 150 - 450 x10E3/uL   Neutrophils 73 Not Estab. %   Lymphs 20 Not Estab. %   MID 7 Not Estab. %   Neutrophils Absolute 6.3 1 - 7 x10E3/uL   Lymphocytes Absolute 1.7 0 - 3 x10E3/uL   MID (Absolute) 0.6 0.1 - 1.6 X10E3/uL      Assessment & Plan:   Problem List Items Addressed This Visit      Cardiovascular and Mediastinum   Hypertension - Primary    Under good control on current regimen. Continue current regimen. Continue to monitor. Call with any concerns. Refills given. Labs drawn today.       Relevant Orders   Comprehensive metabolic panel     Endocrine   DM type 1 (diabetes mellitus, type 1) ([place])    Continue to follow with endocrinology. Call with any concerns.       Hyperlipidemia due to type 1 diabetes mellitus ([place])    Under good control on current regimen. Continue current regimen. Continue to monitor. Call with any concerns. Refills given. Labs drawn today.       Relevant Orders   Comprehensive metabolic panel   Lipid Panel w/o Chol/HDL Ratio     Other   Hyperlipidemia    Under good control on current regimen. Continue current regimen. Continue to monitor. Call with any concerns. Refills given. Labs drawn today.       Relevant Orders   Comprehensive metabolic panel   Lipid Panel w/o Chol/HDL Ratio    Other Visit Diagnoses    RLS (restless legs syndrome)       Likely due to lamictal. Will check labs to look for other cause. Continue to monitor. Call with any  concerns.   Relevant Orders   Ferritin   VITAMIN D 25 Hydroxy (Vit-D Deficiency, Fractures)   Need for hepatitis C screening test       Labs drawn today. Await results.    Relevant Orders   Hepatitis C antibody   Flu vaccine need       Flu shot  given today.   Relevant Orders   Flu Vaccine QUAD 36+ mos IM (Completed)       Follow up plan: Return in about 6 months (around 05/01/2020) for physical.

## 2019-11-03 LAB — COMPREHENSIVE METABOLIC PANEL
ALT: 32 IU/L (ref 0–44)
AST: 29 IU/L (ref 0–40)
Albumin/Globulin Ratio: 3.1 — ABNORMAL HIGH (ref 1.2–2.2)
Albumin: 4.6 g/dL (ref 4.0–5.0)
Alkaline Phosphatase: 65 IU/L (ref 44–121)
BUN/Creatinine Ratio: 6 — ABNORMAL LOW (ref 9–20)
BUN: 6 mg/dL (ref 6–24)
Bilirubin Total: 0.4 mg/dL (ref 0.0–1.2)
CO2: 24 mmol/L (ref 20–29)
Calcium: 9.3 mg/dL (ref 8.7–10.2)
Chloride: 104 mmol/L (ref 96–106)
Creatinine, Ser: 0.93 mg/dL (ref 0.76–1.27)
GFR calc Af Amer: 116 mL/min/{1.73_m2} (ref >59)
GFR calc non Af Amer: 100 mL/min/{1.73_m2} (ref >59)
Globulin, Total: 1.5 g/dL (ref 1.5–4.5)
Glucose: 169 mg/dL — ABNORMAL HIGH (ref 65–99)
Potassium: 4.4 mmol/L (ref 3.5–5.2)
Sodium: 142 mmol/L (ref 134–144)
Total Protein: 6.1 g/dL (ref 6.0–8.5)

## 2019-11-03 LAB — HEPATITIS C ANTIBODY: Hep C Virus Ab: <0.1 s/co ratio (ref 0.0–0.9)

## 2019-11-03 LAB — LIPID PANEL W/O CHOL/HDL RATIO
Cholesterol, Total: 122 mg/dL (ref 100–199)
HDL: 50 mg/dL (ref >39)
LDL Chol Calc (NIH): 59 mg/dL (ref 0–99)
Triglycerides: 61 mg/dL (ref 0–149)
VLDL Cholesterol Cal: 13 mg/dL (ref 5–40)

## 2019-11-03 LAB — VITAMIN D 25 HYDROXY (VIT D DEFICIENCY, FRACTURES): Vit D, 25-Hydroxy: 26.8 ng/mL — ABNORMAL LOW (ref 30.0–100.0)

## 2019-11-03 LAB — FERRITIN: Ferritin: 82 ng/mL (ref 30–400)

## 2019-11-09 ENCOUNTER — Other Ambulatory Visit: Payer: Self-pay

## 2019-11-09 DIAGNOSIS — E7800 Pure hypercholesterolemia, unspecified: Secondary | ICD-10-CM

## 2019-11-09 MED ORDER — ATORVASTATIN CALCIUM 40 MG PO TABS: 40.0000 mg | ORAL_TABLET | Freq: Every day | ORAL | 1 refills | Status: DC

## 2019-11-11 ENCOUNTER — Telehealth (INDEPENDENT_AMBULATORY_CARE_PROVIDER_SITE_OTHER): Payer: 59 | Admitting: Psychiatry

## 2019-11-11 ENCOUNTER — Encounter: Payer: Self-pay | Admitting: Psychiatry

## 2019-11-11 ENCOUNTER — Other Ambulatory Visit: Payer: Self-pay

## 2019-11-11 DIAGNOSIS — F3172 Bipolar disorder, in full remission, most recent episode hypomanic: Secondary | ICD-10-CM | POA: Diagnosis not present

## 2019-11-11 DIAGNOSIS — F411 Generalized anxiety disorder: Secondary | ICD-10-CM

## 2019-11-11 DIAGNOSIS — F17200 Nicotine dependence, unspecified, uncomplicated: Secondary | ICD-10-CM

## 2019-11-11 NOTE — Progress Notes (Signed)
Provider Location : [facility] Patient Location : Home  Participants: Patient , Provider  Virtual Visit via Video Note  I connected with Bernard [name] on 11/11/19 at  4:20 PM EDT by a video enabled telemedicine application and verified that I am speaking with the correct person using two identifiers.   I discussed the limitations of evaluation and management by telemedicine and the availability of in person appointments. The patient expressed understanding and agreed to proceed.     I discussed the assessment and treatment plan with the patient. The patient was provided an opportunity to ask questions and all were answered. The patient agreed with the plan and demonstrated an understanding of the instructions.   The patient was advised to call back or seek an in-person evaluation if the symptoms worsen or if the condition fails to improve as anticipated.  [place] MD OP Progress Note  11/11/2019 5:05 PM [name]  MRN:  [id number]  Chief Complaint:  Chief Complaint    Follow-up     HPI: Bernard [name] is a 44 year old Caucasian male, employed, lives in [place], has a history of bipolar disorder in remission, GAD, tobacco use disorder was evaluated by telemedicine today.  Patient reports he recently had an episode/spell when he felt like he could not do anything for 30 minutes or so.  He reports he started feeling weird late Sunday and throughout Monday and then the next day he had an incident where he felt like at the end of the day he could not do anything at all.  He reports he was completely alert and oriented and did not feel confused, did not have any memory changes or other physical complaints during that time.  He however reports this morning he felt fine.  And he currently does not have any physical symptoms and denies any anxiety or depressive symptoms.  He reports he had stopped smoking 1-1/2 weeks ago and also had started taking a vitamin D supplement  recently.  Other than that there has been no significant changes in his medical history.  Patient denies any situational stressors.  He reports work as stressful however it is going well.  Patient is compliant on medications.  He continues to follow-up with his therapist and is currently thinking about tapering it off a little bit since he is fine.  Patient denies any suicidality, homicidality or perceptual disturbances.  Patient denies any other concerns today.  Visit Diagnosis:    ICD-10-CM   1. Bipolar disorder, in full remission, most recent episode hypomanic ([place])  F31.72   2. GAD (generalized anxiety disorder)  F41.1   3. Tobacco use disorder  F17.200    in remission    Past Psychiatric History: I have reviewed family psychiatric history from my progress note on 02/18/2018  Past Medical History:  Past Medical History:  Diagnosis Date  . Anxiety   . Depression   . Esophageal stricture   . Hyperlipidemia   . Hypertension   . Weight loss     Past Surgical History:  Procedure Laterality Date  . PILONIDAL CYST EXCISION    . WISDOM TOOTH EXTRACTION      Family Psychiatric History: Reviewed family psychiatric history from my progress note on 02/18/2018  Family History:  Family History  Problem Relation Age of Onset  . Cancer Mother   . Anxiety disorder Mother   . Depression Mother   . Cancer Father   . Anxiety disorder Brother   . Depression Brother  Social History: Reviewed social history from my progress note on 02/18/2018 Social History   Socioeconomic History  . Marital status: Married    Spouse name: joan   . Number of children: 2  . Years of education: Not on file  . Highest education level: Bachelor's degree (e.g., BA, AB, BS)  Occupational History  . Not on file  Tobacco Use  . Smoking status: Former Smoker    Packs/day: 0.25    Types: Cigarettes    Start date: 01/22/2017    Quit date: 11/03/2019    Years since quitting: 0.0  . Smokeless  tobacco: Never Used  Vaping Use  . Vaping Use: Never used  Substance and Sexual Activity  . Alcohol use: Yes    Alcohol/week: 2.0 - 6.0 standard drinks    Types: 2 - 6 Cans of beer per week    Comment: on occasion  . Drug use: No  . Sexual activity: Yes    Birth control/protection: None  Other Topics Concern  . Not on file  Social History Narrative  . Not on file   Social Determinants of Health   Financial Resource Strain:   . Difficulty of Paying Living Expenses: Not on file  Food Insecurity:   . Worried About Charity fundraiser in the Last Year: Not on file  . Ran Out of Food in the Last Year: Not on file  Transportation Needs:   . Lack of Transportation (Medical): Not on file  . Lack of Transportation (Non-Medical): Not on file  Physical Activity:   . Days of Exercise per Week: Not on file  . Minutes of Exercise per Session: Not on file  Stress:   . Feeling of Stress : Not on file  Social Connections:   . Frequency of Communication with Friends and Family: Not on file  . Frequency of Social Gatherings with Friends and Family: Not on file  . Attends Religious Services: Not on file  . Active Member of Clubs or Organizations: Not on file  . Attends Archivist Meetings: Not on file  . Marital Status: Not on file    Allergies:  Allergies  Allergen Reactions  . Chantix [Varenicline]     nausea    Metabolic Disorder Labs: Lab Results  Component Value Date   HGBA1C 6.6 09/25/2019   No results found for: PROLACTIN Lab Results  Component Value Date   CHOL 122 11/02/2019   TRIG 61 11/02/2019   HDL 50 11/02/2019   CHOLHDL 3.3 04/17/2018   VLDL 13 08/06/2016   LDLCALC 59 11/02/2019   LDLCALC 61 04/20/2019   Lab Results  Component Value Date   TSH 1.950 04/20/2019   TSH 2.180 04/17/2018    Therapeutic Level Labs: No results found for: LITHIUM No results found for: VALPROATE No components found for:  CBMZ  Current Medications: Current  Outpatient Medications  Medication Sig Dispense Refill  . atorvastatin (LIPITOR) 40 MG tablet Take 1 tablet (40 mg total) by mouth daily. 90 tablet 1  . Cetirizine HCl 10 MG CAPS Take by mouth.    . Continuous Blood Gluc Transmit (DEXCOM G6 TRANSMITTER) MISC USE 1 EACH EVERY 3 (THREE) MONTHS    . CONTOUR NEXT TEST test strip     . famotidine (PEPCID) 20 MG tablet Take 1 tablet (20 mg total) by mouth 2 (two) times daily. 180 tablet 1  . Insulin Glargine (LANTUS SOLOSTAR) 100 UNIT/ML Solostar Pen 50 units daily when off pump    .  Insulin Pen Needle 29G X 12.7MM MISC 1 each by Does not apply route 3 (three) times daily. 100 each 12  . lamoTRIgine (LAMICTAL) 25 MG tablet Take 3 tablets (75 mg total) by mouth daily. 90 tablet 2  . Lancets (ONETOUCH DELICA PLUS [id number]) MISC     . lisinopril (ZESTRIL) 10 MG tablet Take 1 tablet (10 mg total) by mouth daily. 90 tablet 1  . NOVOLOG 100 UNIT/ML injection Inject into the skin.    [name] ondansetron (ZOFRAN ODT) 4 MG disintegrating tablet Take 1 tablet (4 mg total) by mouth every 8 (eight) hours as needed for nausea or vomiting. 30 tablet 0  . pantoprazole (PROTONIX) 40 MG tablet Take 1 tablet (40 mg total) by mouth daily. 90 tablet 3  . ranitidine (ZANTAC) 150 MG capsule Take by mouth daily.     . simethicone (GAS-X) 80 MG chewable tablet Chew 1 tablet (80 mg total) by mouth every 6 (six) hours as needed for flatulence (or cramping). 30 tablet 0   No current facility-administered medications for this visit.     Musculoskeletal: Strength & Muscle Tone: UTA Gait & Station: normal Patient leans: N/A  Psychiatric Specialty Exam: Review of Systems  Psychiatric/Behavioral: Negative for agitation, behavioral problems, confusion, decreased concentration, dysphoric mood, hallucinations, self-injury, sleep disturbance and suicidal ideas. The patient is not nervous/anxious and is not hyperactive.   All other systems reviewed and are negative.   There were no  vitals taken for this visit.There is no height or weight on file to calculate BMI.  General Appearance: Casual  Eye Contact:  Fair  Speech:  Clear and Coherent  Volume:  Normal  Mood:  Euthymic  Affect:  Congruent  Thought Process:  Goal Directed and Descriptions of Associations: Intact  Orientation:  Full (Time, Place, and Person)  Thought Content: Logical   Suicidal Thoughts:  No  Homicidal Thoughts:  No  Memory:  Immediate;   Fair Recent;   Fair Remote;   Fair  Judgement:  Fair  Insight:  Fair  Psychomotor Activity:  Normal  Concentration:  Concentration: Fair and Attention Span: Fair  Recall:  [employer] of Knowledge: Fair  Language: Fair  Akathisia:  No  Handed:  Right  AIMS (if indicated):UTA  Assets:  Communication Skills Desire for Improvement Housing Social Support  ADL's:  Intact  Cognition: WNL  Sleep:  Fair   Screenings: PHQ2-9     Office Visit from 10/05/2019 in [facility] Visit from 04/20/2019 in [facility] Visit from 01/05/2019 in [facility] Visit from 11/24/2018 in [facility] Visit from 04/17/2018 in [facility]  PHQ-2 Total Score 0 1 0 0 0  PHQ-9 Total Score 3 7 0 -- --       Assessment and Plan: Margarita Bobrowski is a 44 year old male, employed, married, lives in [place], has a history of bipolar disorder, anxiety disorder, diabetes, hypertension, hyperlipidemia was evaluated by telemedicine today.  Patient is currently doing well with regards to his mood however does report an episode recently where he felt like he could not function for 30 minutes or so.  He currently denies any physical or mood symptoms at this time.  Discussed plan as noted below.  Plan Bipolar disorder in remission Lamictal 75 mg p.o. daily-reduced dosage.  GAD-stable Continue CBT with Ms. Colleen Can  Tobacco use disorder in remission-patient quit smoking a week and a half  ago.  Patient advised to follow-up  with primary care provider also to be evaluated for his recent spell.  Follow-up in clinic in 2 months or sooner if needed.  I have spent atleast 20 minutes face to face by video with patient today. More than 50 % of the time was spent for preparing to see the patient ( e.g., review of test, records ), ordering medications and test ,psychoeducation and supportive psychotherapy and care coordination,as well as documenting clinical information in electronic health record. This note was generated in part or whole with voice recognition software. Voice recognition is usually quite accurate but there are transcription errors that can and very often do occur. I apologize for any typographical errors that were not detected and corrected.        Ursula Alert, MD 11/11/2019, 5:05 PM

## 2019-11-17 ENCOUNTER — Encounter: Payer: Self-pay | Admitting: Psychiatry

## 2019-11-17 ENCOUNTER — Other Ambulatory Visit: Payer: Self-pay

## 2019-11-17 ENCOUNTER — Telehealth (INDEPENDENT_AMBULATORY_CARE_PROVIDER_SITE_OTHER): Payer: 59 | Admitting: Psychiatry

## 2019-11-17 DIAGNOSIS — R4182 Altered mental status, unspecified: Secondary | ICD-10-CM | POA: Diagnosis not present

## 2019-11-17 DIAGNOSIS — F411 Generalized anxiety disorder: Secondary | ICD-10-CM

## 2019-11-17 DIAGNOSIS — F3172 Bipolar disorder, in full remission, most recent episode hypomanic: Secondary | ICD-10-CM

## 2019-11-17 DIAGNOSIS — F17200 Nicotine dependence, unspecified, uncomplicated: Secondary | ICD-10-CM | POA: Diagnosis not present

## 2019-11-17 MED ORDER — BUSPIRONE HCL 5 MG PO TABS: 5.0000 mg | ORAL_TABLET | Freq: Three times a day (TID) | ORAL | 1 refills | Status: DC

## 2019-11-17 NOTE — Patient Instructions (Signed)
Buspirone tablets What is this medicine? BUSPIRONE (byoo SPYE rone) is used to treat anxiety disorders. This medicine may be used for other purposes; ask your health care provider or pharmacist if you have questions. COMMON BRAND NAME(S): BuSpar What should I tell my health care provider before I take this medicine? They need to know if you have any of these conditions:  kidney or liver disease  an unusual or allergic reaction to buspirone, other medicines, foods, dyes, or preservatives  pregnant or trying to get pregnant  breast-feeding How should I use this medicine? Take this medicine by mouth with a glass of water. Follow the directions on the prescription label. You may take this medicine with or without food. To ensure that this medicine always works the same way for you, you should take it either always with or always without food. Take your doses at regular intervals. Do not take your medicine more often than directed. Do not stop taking except on the advice of your doctor or health care professional. Talk to your pediatrician regarding the use of this medicine in children. Special care may be needed. Overdosage: If you think you have taken too much of this medicine contact a poison control center or emergency room at once. NOTE: This medicine is only for you. Do not share this medicine with others. What if I miss a dose? If you miss a dose, take it as soon as you can. If it is almost time for your next dose, take only that dose. Do not take double or extra doses. What may interact with this medicine? Do not take this medicine with any of the following medications:  linezolid  MAOIs like Carbex, Eldepryl, Marplan, Nardil, and Parnate  methylene blue  procarbazine This medicine may also interact with the following medications:  diazepam  digoxin  diltiazem  erythromycin  grapefruit juice  haloperidol  medicines for mental depression or mood problems  medicines  for seizures like carbamazepine, phenobarbital and phenytoin  nefazodone  other medications for anxiety  rifampin  ritonavir  some antifungal medicines like itraconazole, ketoconazole, and voriconazole  verapamil  warfarin This list may not describe all possible interactions. Give your health care provider a list of all the medicines, herbs, non-prescription drugs, or dietary supplements you use. Also tell them if you smoke, drink alcohol, or use illegal drugs. Some items may interact with your medicine. What should I watch for while using this medicine? Visit your doctor or health care professional for regular checks on your progress. It may take 1 to 2 weeks before your anxiety gets better. You may get drowsy or dizzy. Do not drive, use machinery, or do anything that needs mental alertness until you know how this drug affects you. Do not stand or sit up quickly, especially if you are an older patient. This reduces the risk of dizzy or fainting spells. Alcohol can make you more drowsy and dizzy. Avoid alcoholic drinks. What side effects may I notice from receiving this medicine? Side effects that you should report to your doctor or health care professional as soon as possible:  blurred vision or other vision changes  chest pain  confusion  difficulty breathing  feelings of hostility or anger  muscle aches and pains  numbness or tingling in hands or feet  ringing in the ears  skin rash and itching  vomiting  weakness Side effects that usually do not require medical attention (report to your doctor or health care professional if they continue or  are bothersome):  disturbed dreams, nightmares  headache  nausea  restlessness or nervousness  sore throat and nasal congestion  stomach upset This list may not describe all possible side effects. Call your doctor for medical advice about side effects. You may report side effects to FDA at 1-800-FDA-1088. Where should I  keep my medicine? Keep out of the reach of children. Store at room temperature below 30 degrees C (86 degrees F). Protect from light. Keep container tightly closed. Throw away any unused medicine after the expiration date. NOTE: This sheet is a summary. It may not cover all possible information. If you have questions about this medicine, talk to your doctor, pharmacist, or health care provider.  2020 Elsevier/Gold Standard (2009-09-01 18:06:11)

## 2019-11-17 NOTE — Progress Notes (Signed)
Provider Location : [facility] Patient Location : Home  Participants: Patient , Provider  Virtual Visit via Video Note  I connected with Bernard [name] on 11/17/19 at 10:00 AM EDT by a video enabled telemedicine application and verified that I am speaking with the correct person using two identifiers.   I discussed the limitations of evaluation and management by telemedicine and the availability of in person appointments. The patient expressed understanding and agreed to proceed.    I discussed the assessment and treatment plan with the patient. The patient was provided an opportunity to ask questions and all were answered. The patient agreed with the plan and demonstrated an understanding of the instructions.   The patient was advised to call back or seek an in-person evaluation if the symptoms worsen or if the condition fails to improve as anticipated.  Bernard [name] OP Progress Note  11/17/2019 11:36 AM Bernard [name]  MRN:  [id number]  Chief Complaint:  Chief Complaint    Follow-up     HPI: Bernard [name] is a 44 year old Caucasian male, employed, lives in [place], has a history of bipolar disorder, GAD, tobacco use disorder was evaluated by telemedicine today.  Patient was last seen on 11/11/2019.  At that visit patient had described an episode or spell where he felt he could do anything or function for at least 30 minutes and that had happened without any trigger or any anxiety provoking situation.  He has not had an episode like this ever before.  Patient today returns reporting that he had couple of other spells since his last appointment.  Patient reports that around 5 PM Sunday he felt scared for no reason and he needed to sit down.  Patient reports that at that time he was having a conversation with his wife and he could not form words and could only speak in simple sentences.  Patient reports this went away after 10 seconds or so and he felt quite normal after that.   Patient also had another episode yesterday at around 4 PM.  Patient reports he felt fearful all of a sudden and felt like he could not function for a few seconds.  However yesterday he did not have a lot of physical symptoms like he had on Sunday.  Patient currently denies any significant stressors which could be inducing any kind of anxiety attacks.  Patient reports when the spells happen he is physically okay in the sense his blood pressure, heart rate and pulse ox as well as his blood glucose are all within normal limits.  The only change in his medication regimen is a vitamin D supplement that he started taking over-the-counter few weeks ago.  Patient reports otherwise he is doing well at work.  He denies any significant mood swings.  He is compliant on the Lamictal.  Patient denies any suicidality, homicidality or perceptual disturbances.    Visit Diagnosis:    ICD-10-CM   1. Bipolar disorder, in full remission, most recent episode hypomanic ([place])  F31.72   2. GAD (generalized anxiety disorder)  F41.1 busPIRone (BUSPAR) 5 MG tablet  3. Tobacco use disorder  F17.200   4. Altered mental status, unspecified altered mental status type  R41.82     Past Psychiatric History: I have reviewed past psychiatric history from my progress note on 02/18/2018  Past Medical History:  Past Medical History:  Diagnosis Date  . Anxiety   . Depression   . Esophageal stricture   . Hyperlipidemia   .  Hypertension   . Weight loss     Past Surgical History:  Procedure Laterality Date  . PILONIDAL CYST EXCISION    . WISDOM TOOTH EXTRACTION      Family Psychiatric History: I have reviewed family psychiatric history from my progress note from 02/18/2018  Family History:  Family History  Problem Relation Age of Onset  . Cancer Mother   . Anxiety disorder Mother   . Depression Mother   . Cancer Father   . Anxiety disorder Brother   . Depression Brother     Social History: I have reviewed social  history from my progress note on 02/18/2018 Social History   Socioeconomic History  . Marital status: Married    Spouse name: Bernard [name]   . Number of children: 2  . Years of education: Not on file  . Highest education level: Bachelor's degree (e.g., BA, AB, BS)  Occupational History  . Not on file  Tobacco Use  . Smoking status: Current Every Day Smoker    Packs/day: 0.25    Types: Cigarettes    Start date: 01/22/2017  . Smokeless tobacco: Never Used  . Tobacco comment: started back again, 1/3 pack per day- reported 11/17/2019  Vaping Use  . Vaping Use: Never used  Substance and Sexual Activity  . Alcohol use: Yes    Alcohol/week: 2.0 - 6.0 standard drinks    Types: 2 - 6 Cans of beer per week    Comment: on occasion  . Drug use: No  . Sexual activity: Yes    Birth control/protection: None  Other Topics Concern  . Not on file  Social History Narrative  . Not on file   Social Determinants of Health   Financial Resource Strain:   . Difficulty of Paying Living Expenses: Not on file  Food Insecurity:   . Worried About Charity fundraiser in the Last Year: Not on file  . Ran Out of Food in the Last Year: Not on file  Transportation Needs:   . Lack of Transportation (Medical): Not on file  . Lack of Transportation (Non-Medical): Not on file  Physical Activity:   . Days of Exercise per Week: Not on file  . Minutes of Exercise per Session: Not on file  Stress:   . Feeling of Stress : Not on file  Social Connections:   . Frequency of Communication with Friends and Family: Not on file  . Frequency of Social Gatherings with Friends and Family: Not on file  . Attends Religious Services: Not on file  . Active Member of Clubs or Organizations: Not on file  . Attends Archivist Meetings: Not on file  . Marital Status: Not on file    Allergies:  Allergies  Allergen Reactions  . Chantix [Varenicline]     nausea    Metabolic Disorder Labs: Lab Results  Component  Value Date   HGBA1C 6.6 09/25/2019   No results found for: PROLACTIN Lab Results  Component Value Date   CHOL 122 11/02/2019   TRIG 61 11/02/2019   HDL 50 11/02/2019   CHOLHDL 3.3 04/17/2018   VLDL 13 08/06/2016   LDLCALC 59 11/02/2019   LDLCALC 61 04/20/2019   Lab Results  Component Value Date   TSH 1.950 04/20/2019   TSH 2.180 04/17/2018    Therapeutic Level Labs: No results found for: LITHIUM No results found for: VALPROATE No components found for:  CBMZ  Current Medications: Current Outpatient Medications  Medication Sig Dispense Refill  .  atorvastatin (LIPITOR) 40 MG tablet Take 1 tablet (40 mg total) by mouth daily. 90 tablet 1  . busPIRone (BUSPAR) 5 MG tablet Take 1 tablet (5 mg total) by mouth 3 (three) times daily. 90 tablet 1  . Cetirizine HCl 10 MG CAPS Take by mouth.    . Continuous Blood Gluc Transmit (DEXCOM G6 TRANSMITTER) MISC USE 1 EACH EVERY 3 (THREE) MONTHS    . CONTOUR NEXT TEST test strip     . famotidine (PEPCID) 20 MG tablet Take 1 tablet (20 mg total) by mouth 2 (two) times daily. 180 tablet 1  . Insulin Glargine (LANTUS SOLOSTAR) 100 UNIT/ML Solostar Pen 50 units daily when off pump    . Insulin Pen Needle 29G X 12.7MM MISC 1 each by Does not apply route 3 (three) times daily. 100 each 12  . lamoTRIgine (LAMICTAL) 25 MG tablet Take 3 tablets (75 mg total) by mouth daily. 90 tablet 2  . Lancets (ONETOUCH DELICA PLUS [id number]) MISC     . lisinopril (ZESTRIL) 10 MG tablet Take 1 tablet (10 mg total) by mouth daily. 90 tablet 1  . NOVOLOG 100 UNIT/ML injection Inject into the skin.    [name] ondansetron (ZOFRAN ODT) 4 MG disintegrating tablet Take 1 tablet (4 mg total) by mouth every 8 (eight) hours as needed for nausea or vomiting. 30 tablet 0  . pantoprazole (PROTONIX) 40 MG tablet Take 1 tablet (40 mg total) by mouth daily. 90 tablet 3  . ranitidine (ZANTAC) 150 MG capsule Take by mouth daily.     . simethicone (GAS-X) 80 MG chewable tablet Chew 1  tablet (80 mg total) by mouth every 6 (six) hours as needed for flatulence (or cramping). 30 tablet 0   No current facility-administered medications for this visit.     Musculoskeletal: Strength & Muscle Tone: UTA Gait & Station: normal Patient leans: N/A  Psychiatric Specialty Exam: Review of Systems  Neurological:       Episodes of AMS  Psychiatric/Behavioral: Negative for agitation, behavioral problems, confusion, decreased concentration, dysphoric mood, hallucinations, self-injury, sleep disturbance and suicidal ideas. The patient is nervous/anxious. The patient is not hyperactive.   All other systems reviewed and are negative.   There were no vitals taken for this visit.There is no height or weight on file to calculate BMI.  General Appearance: Casual  Eye Contact:  Fair  Speech:  Clear and Coherent  Volume:  Normal  Mood:  Anxious  Affect:  Congruent  Thought Process:  Goal Directed and Descriptions of Associations: Intact  Orientation:  Full (Time, Place, and Person)  Thought Content: Logical   Suicidal Thoughts:  No  Homicidal Thoughts:  No  Memory:  Immediate;   Fair Recent;   Fair Remote;   Fair  Judgement:  Fair  Insight:  Fair  Psychomotor Activity:  Normal  Concentration:  Concentration: Fair and Attention Span: Fair  Recall:  [employer] of Knowledge: Fair  Language: Fair  Akathisia:  No  Handed:  Right  AIMS (if indicated): UTA  Assets:  Communication Skills Desire for Improvement Housing Intimacy Social Support Talents/Skills Transportation  ADL's:  Intact  Cognition: WNL  Sleep:  Fair   Screenings: PHQ2-9     Office Visit from 10/05/2019 in [facility] Visit from 04/20/2019 in [facility] Visit from 01/05/2019 in [facility] Visit from 11/24/2018 in [facility] Visit from 04/17/2018 in [facility]  PHQ-2 Total Score 0 1  0 0 0  PHQ-9 Total Score 3 7 0 --  --       Assessment and Plan: Bernard [name] is a 44 year old male, employed, married, lives in [place], has a history of bipolar disorder, anxiety disorder, diabetes, hypertension, hyperlipidemia was evaluated by telemedicine today.  Patient is currently having episodes or spells of altered mental status as noted above.  He will benefit from a neurology evaluation to which he agrees.  Discussed plan as noted below.  Plan Bipolar disorder in remission Lamictal 75 mg p.o. daily-reduced dosage.  GAD-stable However due to his recent episodes or spells, unknown if this is anxiety induced-we will start BuSpar 5 mg p.o. 3 times daily Patient to continue CBT with Ms. Colleen Can  Tobacco use disorder-unstable Patient started back smoking a week ago. Provided counseling  Altered mental status-patient with episodes or spells as noted above will benefit from a neurology clearance.  Will refer him to neurology.  Follow-up in clinic in 3 to 4 weeks or sooner if needed.  I have spent atleast 20 minutes face to face by video with patient today. More than 50 % of the time was spent for preparing to see the patient ( e.g., review of test, records ), ordering medications and test ,psychoeducation and supportive psychotherapy and care coordination,as well as documenting clinical information in electronic health record. This note was generated in part or whole with voice recognition software. Voice recognition is usually quite accurate but there are transcription errors that can and very often do occur. I apologize for any typographical errors that were not detected and corrected.        Ursula Alert, [name] 11/17/2019, 11:36 AM

## 2019-11-26 DIAGNOSIS — R569 Unspecified convulsions: Secondary | ICD-10-CM | POA: Insufficient documentation

## 2019-12-02 ENCOUNTER — Other Ambulatory Visit: Payer: Self-pay | Admitting: Acute Care

## 2019-12-02 DIAGNOSIS — R4182 Altered mental status, unspecified: Secondary | ICD-10-CM

## 2019-12-02 DIAGNOSIS — R569 Unspecified convulsions: Secondary | ICD-10-CM

## 2019-12-08 ENCOUNTER — Ambulatory Visit (INDEPENDENT_AMBULATORY_CARE_PROVIDER_SITE_OTHER): Payer: Managed Care, Other (non HMO) | Admitting: Family Medicine

## 2019-12-08 ENCOUNTER — Encounter: Payer: Self-pay | Admitting: Family Medicine

## 2019-12-08 VITALS — Temp 98.2°F | Ht 70.0 in

## 2019-12-08 DIAGNOSIS — R059 Cough, unspecified: Secondary | ICD-10-CM

## 2019-12-08 MED ORDER — PREDNISONE 50 MG PO TABS: 50.0000 mg | ORAL_TABLET | Freq: Every day | ORAL | 0 refills | Status: DC

## 2019-12-08 MED ORDER — HYDROCOD POLST-CPM POLST ER 10-8 MG/5ML PO SUER
5.0000 mL | Freq: Two times a day (BID) | ORAL | 0 refills | Status: DC | PRN
Start: 2019-12-08 — End: 2020-03-28

## 2019-12-08 MED ORDER — AZITHROMYCIN 250 MG PO TABS: ORAL_TABLET | ORAL | 0 refills | Status: DC

## 2019-12-08 MED ORDER — BENZONATATE 200 MG PO CAPS: 200.0000 mg | ORAL_CAPSULE | Freq: Two times a day (BID) | ORAL | 0 refills | Status: DC | PRN

## 2019-12-08 NOTE — Progress Notes (Signed)
Temp 98.2 F (36.8 C) (Oral)   Ht 5' 10"  (1.778 m)   SpO2 95%   BMI 28.41 kg/m    Subjective:    Patient ID: Bernard [name], male    DOB: 02/01/1976, 44 y.o.   MRN: [id number]  HPI: [name] is a 44 y.o. male  Chief Complaint  Patient presents with  . Cough    no fever, coughing up clear mucous    UPPER RESPIRATORY TRACT INFECTION- has taken a few covid tests and they have been negative.  Duration: about 9 days Worst symptom: cough Fever: no Cough: yes Shortness of breath: no Wheezing: no Chest pain: no Chest tightness: yes Chest congestion: no Nasal congestion: no Runny nose: no Post nasal drip: no Sneezing: no Sore throat: no Swollen glands: no Sinus pressure: no Headache: no Face pain: no Toothache: no Ear pain: no  Ear pressure: no  Eyes red/itching:no Eye drainage/crusting: no  Vomiting: no Rash: no Fatigue: no Sick contacts: no Strep contacts: no  Context: stable Recurrent sinusitis: no Relief with OTC cold/cough medications: no  Treatments attempted: pseudoephedrine   Relevant past medical, surgical, family and social history reviewed and updated as indicated. Interim medical history since our last visit reviewed. Allergies and medications reviewed and updated.  Review of Systems  Constitutional: Negative for activity change, appetite change, chills, diaphoresis, fatigue, fever and unexpected weight change.  HENT: Negative.   Respiratory: Positive for cough and chest tightness. Negative for apnea, choking, shortness of breath, wheezing and stridor.   Cardiovascular: Negative.   Neurological: Negative.   Psychiatric/Behavioral: Negative.     Per HPI unless specifically indicated above     Objective:    Temp 98.2 F (36.8 C) (Oral)   Ht 5' 10"  (1.778 m)   SpO2 95%   BMI 28.41 kg/m   Wt Readings from Last 3 Encounters:  11/02/19 198 lb (89.8 kg)  10/05/19 198 lb (89.8 kg)  05/22/19 210 lb (95.3 kg)      Physical Exam Vitals and nursing note reviewed.  Pulmonary:     Effort: Pulmonary effort is normal. No respiratory distress.     Comments: Speaking in full sentences Neurological:     Mental Status: He is alert.  Psychiatric:        Mood and Affect: Mood normal.        Behavior: Behavior normal.        Thought Content: Thought content normal.        Judgment: Judgment normal.     Results for orders placed or performed in visit on 11/02/19  Comprehensive metabolic panel  Result Value Ref Range   Glucose 169 (H) 65 - 99 mg/dL   BUN 6 6 - 24 mg/dL   Creatinine, Ser 0.93 0.76 - 1.27 mg/dL   GFR calc non Af Amer 100 >59 mL/min/1.73   GFR calc Af Amer 116 >59 mL/min/1.73   BUN/Creatinine Ratio 6 (L) 9 - 20   Sodium 142 134 - 144 mmol/L   Potassium 4.4 3.5 - 5.2 mmol/L   Chloride 104 96 - 106 mmol/L   CO2 24 20 - 29 mmol/L   Calcium 9.3 8.7 - 10.2 mg/dL   Total Protein 6.1 6.0 - 8.5 g/dL   Albumin 4.6 4.0 - 5.0 g/dL   Globulin, Total 1.5 1.5 - 4.5 g/dL   Albumin/Globulin Ratio 3.1 (H) 1.2 - 2.2   Bilirubin Total 0.4 0.0 - 1.2 mg/dL   Alkaline Phosphatase 65 44 - 121  IU/L   AST 29 0 - 40 IU/L   ALT 32 0 - 44 IU/L  Lipid Panel w/o Chol/HDL Ratio  Result Value Ref Range   Cholesterol, Total 122 100 - 199 mg/dL   Triglycerides 61 0 - 149 mg/dL   HDL 50 >39 mg/dL   VLDL Cholesterol Cal 13 5 - 40 mg/dL   LDL Chol Calc (NIH) 59 0 - 99 mg/dL  Hepatitis C antibody  Result Value Ref Range   Hep C Virus Ab <0.1 0.0 - 0.9 s/co ratio  Ferritin  Result Value Ref Range   Ferritin 82 30.0 - 400.0 ng/mL  VITAMIN D 25 Hydroxy (Vit-D Deficiency, Fractures)  Result Value Ref Range   Vit D, 25-Hydroxy 26.8 (L) 30.0 - 100.0 ng/mL      Assessment & Plan:   Problem List Items Addressed This Visit    None    Visit Diagnoses    Cough    -  Primary   Concern for bronchitis. Will treat with prednisone and azithromycin. Tussionex and tessalon for comfort. Call if not getting better or  getting worse.        Follow up plan: Return if symptoms worsen or fail to improve.   . This visit was completed via telephone due to the restrictions of the COVID-19 pandemic. All issues as above were discussed and addressed but no physical exam was performed. If it was felt that the patient should be evaluated in the office, they were directed there. The patient verbally consented to this visit. Patient was unable to complete an audio/visual visit due to Lack of equipment. Due to the catastrophic nature of the COVID-19 pandemic, this visit was done through audio contact only. . Location of the patient: home . Location of the provider: work . Those involved with this call:  . Provider: Park Liter, DO . CMA: Yvonna Alanis, [place] . Front Desk/Registration: Jill Side  . Time spent on call: 21 minutes on the phone discussing health concerns. 23 minutes total spent in review of patient's record and preparation of their chart.

## 2019-12-10 ENCOUNTER — Ambulatory Visit
Admission: RE | Admit: 2019-12-10 | Discharge: 2019-12-10 | Disposition: A | Discharge status: Home / Self Care | Payer: Managed Care, Other (non HMO) | Source: Ambulatory Visit | Attending: Acute Care | Admitting: Acute Care

## 2019-12-10 ENCOUNTER — Telehealth: Payer: Self-pay

## 2019-12-10 ENCOUNTER — Other Ambulatory Visit: Payer: Self-pay

## 2019-12-10 DIAGNOSIS — R4182 Altered mental status, unspecified: Secondary | ICD-10-CM | POA: Insufficient documentation

## 2019-12-10 DIAGNOSIS — R569 Unspecified convulsions: Secondary | ICD-10-CM

## 2019-12-10 DIAGNOSIS — F411 Generalized anxiety disorder: Secondary | ICD-10-CM

## 2019-12-10 MED ORDER — CLONAZEPAM 0.5 MG PO TABS: 0.5000 mg | ORAL_TABLET | Freq: Every day | ORAL | 1 refills | Status: DC | PRN

## 2019-12-10 NOTE — Telephone Encounter (Signed)
Returned call to patient.  Patient reports he had another spell today after he will return from his MRI.  He reports it lasted for 5 minutes at that time he just fell to the floor and could not respond much to his wife.  He reports prior to this event he was frustrated with ordering something on his phone through a nap.  According to wife he has low frustration tolerance and she does not know if that triggered it or not.  He is currently following up with neurology.  He had an MRI scan and has upcoming visit scheduled.  Discussed with patient that he has to be neurologically cleared to be given a diagnosis of functional neurological symptom disorder to explain the spells or attacks that he has been having.  In the meantime he will follow-up with his neurologist and his psychotherapist.  We will start Klonopin 0.5 mg as needed for severe panic attacks or anxiety attacks.  Advised patient to limit use.

## 2019-12-10 NOTE — Telephone Encounter (Signed)
pt states he is having medication issues and wanted to discuss possible medication changes.

## 2019-12-17 ENCOUNTER — Telehealth: Payer: Self-pay | Admitting: Family Medicine

## 2019-12-17 ENCOUNTER — Telehealth (INDEPENDENT_AMBULATORY_CARE_PROVIDER_SITE_OTHER): Payer: 59 | Admitting: Psychiatry

## 2019-12-17 ENCOUNTER — Encounter: Payer: Self-pay | Admitting: Psychiatry

## 2019-12-17 ENCOUNTER — Other Ambulatory Visit: Payer: Self-pay

## 2019-12-17 DIAGNOSIS — F3172 Bipolar disorder, in full remission, most recent episode hypomanic: Secondary | ICD-10-CM

## 2019-12-17 DIAGNOSIS — F17200 Nicotine dependence, unspecified, uncomplicated: Secondary | ICD-10-CM | POA: Diagnosis not present

## 2019-12-17 DIAGNOSIS — F411 Generalized anxiety disorder: Secondary | ICD-10-CM | POA: Diagnosis not present

## 2019-12-17 MED ORDER — BUSPIRONE HCL 10 MG PO TABS: 10.0000 mg | ORAL_TABLET | Freq: Three times a day (TID) | ORAL | 1 refills | Status: DC

## 2019-12-17 NOTE — Telephone Encounter (Signed)
Beautiful. Thanks!

## 2019-12-17 NOTE — Progress Notes (Signed)
Virtual Visit via Video Note  I connected with Bernard [name] on 12/17/19 at  1:20 PM EST by a video enabled telemedicine application and verified that I am speaking with the correct person using two identifiers. Location Provider Location : [facility] Patient Location : Home  Participants: Patient , Provider   I discussed the limitations of evaluation and management by telemedicine and the availability of in person appointments. The patient expressed understanding and agreed to proceed.    I discussed the assessment and treatment plan with the patient. The patient was provided an opportunity to ask questions and all were answered. The patient agreed with the plan and demonstrated an understanding of the instructions.   The patient was advised to call back or seek an in-person evaluation if the symptoms worsen or if the condition fails to improve as anticipated.   [place] MD OP Progress Note  12/17/2019 5:52 PM Sayf Muaad Boehning  MRN:  [id number]  Chief Complaint:  Chief Complaint    Follow-up     HPI: Bernard [name] is a 44 year old Caucasian male, employed, lives in [place], has a history of bipolar disorder, GAD, tobacco use disorder was evaluated by telemedicine today.  Patient today reports he continues to have these spells of altered mental status.  He reports last Friday and Saturday he had minor spells.  He reports Sunday and Monday went okay.  Patient reports he had an EEG done this morning.  He has upcoming appointment with neurologist to discuss it.  He reports he had a psychotherapy visit and his therapist did discuss coping techniques and he has been practicing them which does help with his anxiety.  Overall he is able to manage his anxiety fairly.  He however is interested in dosage increase of BuSpar.  He denies side effects.  Patient reports work is going well.  He has good social support system and that does help.  He denies any suicidality,  homicidality or perceptual disturbances.  Patient denies any other concerns today.  Visit Diagnosis:    ICD-10-CM   1. Bipolar disorder, in full remission, most recent episode hypomanic ([place])  F31.72 busPIRone (BUSPAR) 10 MG tablet  2. GAD (generalized anxiety disorder)  F41.1 busPIRone (BUSPAR) 10 MG tablet  3. Tobacco use disorder  F17.200     Past Psychiatric History: I have reviewed past psychiatric history from my progress note on 02/18/2018.  Past Medical History:  Past Medical History:  Diagnosis Date   Anxiety    Depression    Esophageal stricture    Hyperlipidemia    Hypertension    Weight loss     Past Surgical History:  Procedure Laterality Date   PILONIDAL CYST EXCISION     WISDOM TOOTH EXTRACTION      Family Psychiatric History: I have reviewed family psychiatric history from my progress note on 02/18/2018.  Family History:  Family History  Problem Relation Age of Onset   Cancer Mother    Anxiety disorder Mother    Depression Mother    Cancer Father    Anxiety disorder Brother    Depression Brother     Social History: I have reviewed social history from my progress note on 02/18/2018. Social History   Socioeconomic History   Marital status: Married    Spouse name: joan    Number of children: 2   Years of education: Not on file   Highest education level: Bachelor's degree (e.g., BA, AB, BS)  Occupational History   Not on  file  Tobacco Use   Smoking status: Current Every Day Smoker    Packs/day: 0.25    Types: Cigarettes    Start date: 01/22/2017   Smokeless tobacco: Never Used   Tobacco comment: started back again, 1/3 pack per day- reported 11/17/2019  Vaping Use   Vaping Use: Never used  Substance and Sexual Activity   Alcohol use: Yes    Alcohol/week: 2.0 - 6.0 standard drinks    Types: 2 - 6 Cans of beer per week    Comment: on occasion   Drug use: No   Sexual activity: Yes    Birth control/protection: None   Other Topics Concern   Not on file  Social History Narrative   Not on file   Social Determinants of Health   Financial Resource Strain:    Difficulty of Paying Living Expenses: Not on file  Food Insecurity:    Worried About Charity fundraiser in the Last Year: Not on file   [employer] of Food in the Last Year: Not on file  Transportation Needs:    Lack of Transportation (Medical): Not on file   Lack of Transportation (Non-Medical): Not on file  Physical Activity:    Days of Exercise per Week: Not on file   Minutes of Exercise per Session: Not on file  Stress:    Feeling of Stress : Not on file  Social Connections:    Frequency of Communication with Friends and Family: Not on file   Frequency of Social Gatherings with Friends and Family: Not on file   Attends Religious Services: Not on file   Active Member of Clubs or Organizations: Not on file   Attends Archivist Meetings: Not on file   Marital Status: Not on file    Allergies:  Allergies  Allergen Reactions   Chantix [Varenicline]     nausea    Metabolic Disorder Labs: Lab Results  Component Value Date   HGBA1C 6.6 09/25/2019   No results found for: PROLACTIN Lab Results  Component Value Date   CHOL 122 11/02/2019   TRIG 61 11/02/2019   HDL 50 11/02/2019   CHOLHDL 3.3 04/17/2018   VLDL 13 08/06/2016   LDLCALC 59 11/02/2019   LDLCALC 61 04/20/2019   Lab Results  Component Value Date   TSH 1.950 04/20/2019   TSH 2.180 04/17/2018    Therapeutic Level Labs: No results found for: LITHIUM No results found for: VALPROATE No components found for:  CBMZ  Current Medications: Current Outpatient Medications  Medication Sig Dispense Refill   atorvastatin (LIPITOR) 40 MG tablet Take 1 tablet (40 mg total) by mouth daily. 90 tablet 1   azithromycin (ZITHROMAX) 250 MG tablet 2 tabs today and 1 tab daily for 4 days 6 tablet 0   benzonatate (TESSALON) 200 MG capsule Take 1 capsule  (200 mg total) by mouth 2 (two) times daily as needed for cough. 20 capsule 0   busPIRone (BUSPAR) 10 MG tablet Take 1 tablet (10 mg total) by mouth 3 (three) times daily. 90 tablet 1   Cetirizine HCl 10 MG CAPS Take by mouth.     chlorpheniramine-HYDROcodone (TUSSIONEX PENNKINETIC ER) 10-8 MG/5ML SUER Take 5 mLs by mouth every 12 (twelve) hours as needed. 50 mL 0   clonazePAM (KLONOPIN) 0.5 MG tablet Take 1 tablet (0.5 mg total) by mouth daily as needed for anxiety. Take only for severe anxiety attacks 12 tablet 1   Continuous Blood Gluc Sensor (DEXCOM G6 SENSOR)  MISC SMARTSIG:1 Each Topical Every 10 Days     Continuous Blood Gluc Transmit (DEXCOM G6 TRANSMITTER) MISC USE 1 EACH EVERY 3 (THREE) MONTHS     CONTOUR NEXT TEST test strip      famotidine (PEPCID) 20 MG tablet Take 1 tablet (20 mg total) by mouth 2 (two) times daily. 180 tablet 1   Insulin Glargine (LANTUS SOLOSTAR) 100 UNIT/ML Solostar Pen 50 units daily when off pump     Insulin Pen Needle 29G X 12.7MM MISC 1 each by Does not apply route 3 (three) times daily. 100 each 12   lamoTRIgine (LAMICTAL) 25 MG tablet Take 3 tablets (75 mg total) by mouth daily. 90 tablet 2   Lancets (ONETOUCH DELICA PLUS [id number]) MISC      lisinopril (ZESTRIL) 10 MG tablet Take 1 tablet (10 mg total) by mouth daily. 90 tablet 1   NOVOLOG 100 UNIT/ML injection Inject into the skin.     ondansetron (ZOFRAN ODT) 4 MG disintegrating tablet Take 1 tablet (4 mg total) by mouth every 8 (eight) hours as needed for nausea or vomiting. 30 tablet 0   pantoprazole (PROTONIX) 40 MG tablet Take 1 tablet (40 mg total) by mouth daily. 90 tablet 3   predniSONE (DELTASONE) 50 MG tablet Take 1 tablet (50 mg total) by mouth daily with breakfast. 5 tablet 0   ranitidine (ZANTAC) 150 MG capsule Take by mouth daily.      simethicone (GAS-X) 80 MG chewable tablet Chew 1 tablet (80 mg total) by mouth every 6 (six) hours as needed for flatulence (or cramping). 30  tablet 0   No current facility-administered medications for this visit.     Musculoskeletal: Strength & Muscle Tone: UTA Gait & Station: UTA Patient leans: N/A  Psychiatric Specialty Exam: Review of Systems  Neurological: Positive for seizures (seizure like spells).  All other systems reviewed and are negative.   There were no vitals taken for this visit.There is no height or weight on file to calculate BMI.  General Appearance: Casual  Eye Contact:  Fair  Speech:  Clear and Coherent  Volume:  Normal  Mood:  Euthymic  Affect:  Congruent  Thought Process:  Goal Directed and Descriptions of Associations: Intact  Orientation:  Full (Time, Place, and Person)  Thought Content: Logical   Suicidal Thoughts:  No  Homicidal Thoughts:  No  Memory:  Immediate;   Fair Recent;   Fair Remote;   Fair  Judgement:  Fair  Insight:  Fair  Psychomotor Activity:  Normal  Concentration:  Concentration: Fair and Attention Span: Fair  Recall:  [employer] of Knowledge: Fair  Language: Fair  Akathisia:  No  Handed:  Right  AIMS (if indicated): UTA  Assets:  Communication Skills Desire for Improvement Housing Social Support  ADL's:  Intact  Cognition: WNL  Sleep:  Fair   Screenings: PHQ2-9     Office Visit from 12/08/2019 in [facility] Visit from 10/05/2019 in [facility] Visit from 04/20/2019 in [facility] Visit from 01/05/2019 in [facility] Visit from 11/24/2018 in [facility]  PHQ-2 Total Score 0 0 1 0 0  PHQ-9 Total Score 0 3 7 0 --       Assessment and Plan: Miquel Stacks is a 44 year old male, employed, married, lives in [place], has a history of bipolar disorder, anxiety disorder, diabetes, hypertension, hyperlipidemia was evaluated by telemedicine today.  Patient is currently struggling with seizure-like  spells which does make him anxious.  Discussed plan as noted  below.  Plan Bipolar disorder in remission Lamictal 75 mg p.o. daily-reduced dosage  GAD-unstable Increase BuSpar to 10 mg p.o. 3 times daily He does have anxiety due to his recent health problems. He will continue to follow-up with Ms. Colleen Can for CBT  Tobacco use disorder-unstable Provided counseling.  For seizure-like spells, he will continue to follow-up with neurology.  He had an EEG completed.  Pending report. I have reviewed notes per Ms. Chipper Herb nurse practitioner dated 11/26/2019-neurology-will rule out seizure activity with routine EEG 2 weeks later we will set up an extended in office EEG.  We will order a brain MRI without contrast.'  Follow-up in clinic in 3 to 4 weeks or sooner if needed.  I have spent atleast 20 minutes face to face by video with patient today. More than 50 % of the time was spent for preparing to see the patient ( e.g., review of test, records ), obtaining and to review and separately obtained history , ordering medications and test ,psychoeducation and supportive psychotherapy and care coordination,as well as documenting clinical information in electronic health record. This note was generated in part or whole with voice recognition software. Voice recognition is usually quite accurate but there are transcription errors that can and very often do occur. I apologize for any typographical errors that were not detected and corrected.          Ursula Alert, MD 12/18/2019, 8:59 AM

## 2019-12-17 NOTE — Telephone Encounter (Signed)
FYI

## 2019-12-17 NOTE — Telephone Encounter (Signed)
Patient is calling to let Dr. Wynetta Emery know that he psychiatrist referred him to neurology for and his is wanting Dr. Wynetta Emery to know. [facility] is at [facility] Patient would like Dr. Wynetta Emery to be in the loop. Please advise [phone]

## 2019-12-21 ENCOUNTER — Other Ambulatory Visit: Payer: Self-pay | Admitting: Psychiatry

## 2019-12-21 DIAGNOSIS — F3172 Bipolar disorder, in full remission, most recent episode hypomanic: Secondary | ICD-10-CM

## 2019-12-22 ENCOUNTER — Telehealth: Payer: Self-pay

## 2019-12-22 ENCOUNTER — Other Ambulatory Visit: Payer: Self-pay

## 2019-12-22 DIAGNOSIS — F3172 Bipolar disorder, in full remission, most recent episode hypomanic: Secondary | ICD-10-CM

## 2019-12-22 DIAGNOSIS — I10 Essential (primary) hypertension: Secondary | ICD-10-CM

## 2019-12-22 DIAGNOSIS — F411 Generalized anxiety disorder: Secondary | ICD-10-CM

## 2019-12-22 MED ORDER — LISINOPRIL 10 MG PO TABS: 10.0000 mg | ORAL_TABLET | Freq: Every day | ORAL | 1 refills | Status: DC

## 2019-12-22 MED ORDER — BUSPIRONE HCL 10 MG PO TABS: 10.0000 mg | ORAL_TABLET | Freq: Two times a day (BID) | ORAL | 1 refills | Status: DC

## 2019-12-22 NOTE — Telephone Encounter (Signed)
Patient last seen 11/02/19 and has appointment 05/03/20.

## 2019-12-22 NOTE — Telephone Encounter (Signed)
Returned call to patient.  Patient reports he feels much better now and was able to figure out a repressed conflict that he had in his relationship with his wife which may have happened 15 years prior.  He believes this may have happened his recent episodes and it may all be psychological.  He reports he was able to get some control over that and since has been doing well.  He wants to reduce the dosage of BuSpar if possible.  We will reduce BuSpar to 10 mg twice a day.  He will continue to follow-up with neurology.

## 2019-12-22 NOTE — Telephone Encounter (Signed)
pt called left message that he feels he needs a adjustment on his medications.

## 2019-12-28 ENCOUNTER — Other Ambulatory Visit: Payer: Self-pay | Admitting: Family Medicine

## 2020-01-04 LAB — HM DIABETES EYE EXAM

## 2020-01-18 ENCOUNTER — Encounter: Payer: Self-pay | Admitting: Psychiatry

## 2020-01-18 ENCOUNTER — Other Ambulatory Visit: Payer: Self-pay

## 2020-01-18 ENCOUNTER — Telehealth (INDEPENDENT_AMBULATORY_CARE_PROVIDER_SITE_OTHER): Payer: 59 | Admitting: Psychiatry

## 2020-01-18 DIAGNOSIS — F3172 Bipolar disorder, in full remission, most recent episode hypomanic: Secondary | ICD-10-CM

## 2020-01-18 DIAGNOSIS — F17200 Nicotine dependence, unspecified, uncomplicated: Secondary | ICD-10-CM

## 2020-01-18 DIAGNOSIS — F411 Generalized anxiety disorder: Secondary | ICD-10-CM

## 2020-01-18 NOTE — Progress Notes (Signed)
Virtual Visit via Video Note  I connected with Bernard [name] on 01/18/20 at  4:40 PM EST by a video enabled telemedicine application and verified that I am speaking with the correct person using two identifiers.  Location Provider Location : [facility] Patient Location : Home  Participants: Patient , Provider   I discussed the limitations of evaluation and management by telemedicine and the availability of in person appointments. The patient expressed understanding and agreed to proceed.   I discussed the assessment and treatment plan with the patient. The patient was provided an opportunity to ask questions and all were answered. The patient agreed with the plan and demonstrated an understanding of the instructions.   The patient was advised to call back or seek an in-person evaluation if the symptoms worsen or if the condition fails to improve as anticipated.   [place] MD OP Progress Note  01/18/2020 5:12 PM Bernard [name]  MRN:  [id number]  Chief Complaint:  Chief Complaint    Follow-up     HPI: Bernard [name] is a 44 year old Caucasian male, employed, lives in [place], has a history of bipolar disorder, GAD, tobacco use disorder was evaluated by telemedicine today.    Patient today reports he has not had any spells of altered mental status since he last spoke to Probation officer which was on 12/22/2019.  He reports so far he is doing well on the current medications.  He tapered himself off of the BuSpar.  He is doing well without it.  He has not had any anxiety attacks or nervousness since stopping it.  He is compliant on the Lamictal.  He does report possible visual hallucinations when he is about to fall asleep, but this happens couple of times a week and may have started since being on the Lamictal.  He reports he sees spiders which lasts for a few seconds and does not bother him much.  This only happens when he is falling asleep.  He reports he is currently not interested in  changing or reducing the Lamictal dosage since the hallucinations does not distress him.  Patient reports he continues to follow-up with neurologist and has upcoming appointment scheduled.  Patient denies any suicidality or homicidality.  Patient reports he was able to restart Chantix, this time he is tolerating it well.  He is trying to cut back or quit smoking before a trip that he is about to take in a few weeks.  He denies any other concerns today.   Visit Diagnosis:    ICD-10-CM   1. Bipolar disorder, in full remission, most recent episode hypomanic ([place])  F31.72   2. GAD (generalized anxiety disorder)  F41.1   3. Tobacco use disorder  F17.200     Past Psychiatric History: I have reviewed past psychiatric history from my progress note on 02/18/2018  Past Medical History:  Past Medical History:  Diagnosis Date  . Anxiety   . Depression   . Esophageal stricture   . Hyperlipidemia   . Hypertension   . Weight loss     Past Surgical History:  Procedure Laterality Date  . PILONIDAL CYST EXCISION    . WISDOM TOOTH EXTRACTION      Family Psychiatric History: Reviewed family psychiatric history from my progress note on 02/18/2018  Family History:  Family History  Problem Relation Age of Onset  . Cancer Mother   . Anxiety disorder Mother   . Depression Mother   . Cancer Father   . Anxiety disorder  Brother   . Depression Brother     Social History: Reviewed social history from my progress note on 02/18/2018 Social History   Socioeconomic History  . Marital status: Married    Spouse name: joan   . Number of children: 2  . Years of education: Not on file  . Highest education level: Bachelor's degree (e.g., BA, AB, BS)  Occupational History  . Not on file  Tobacco Use  . Smoking status: Current Every Day Smoker    Packs/day: 0.25    Types: Cigarettes    Start date: 01/22/2017  . Smokeless tobacco: Never Used  . Tobacco comment: started back again, 1/3 pack per  day- reported 11/17/2019  Vaping Use  . Vaping Use: Never used  Substance and Sexual Activity  . Alcohol use: Yes    Alcohol/week: 2.0 - 6.0 standard drinks    Types: 2 - 6 Cans of beer per week    Comment: on occasion  . Drug use: No  . Sexual activity: Yes    Birth control/protection: None  Other Topics Concern  . Not on file  Social History Narrative  . Not on file   Social Determinants of Health   Financial Resource Strain: Not on file  Food Insecurity: Not on file  Transportation Needs: Not on file  Physical Activity: Not on file  Stress: Not on file  Social Connections: Not on file    Allergies:  Allergies  Allergen Reactions  . Chantix [Varenicline]     nausea    Metabolic Disorder Labs: Lab Results  Component Value Date   HGBA1C 6.6 09/25/2019   No results found for: PROLACTIN Lab Results  Component Value Date   CHOL 122 11/02/2019   TRIG 61 11/02/2019   HDL 50 11/02/2019   CHOLHDL 3.3 04/17/2018   VLDL 13 08/06/2016   LDLCALC 59 11/02/2019   LDLCALC 61 04/20/2019   Lab Results  Component Value Date   TSH 1.950 04/20/2019   TSH 2.180 04/17/2018    Therapeutic Level Labs: No results found for: LITHIUM No results found for: VALPROATE No components found for:  CBMZ  Current Medications: Current Outpatient Medications  Medication Sig Dispense Refill  . atorvastatin (LIPITOR) 40 MG tablet Take 1 tablet (40 mg total) by mouth daily. 90 tablet 1  . azithromycin (ZITHROMAX) 250 MG tablet 2 tabs today and 1 tab daily for 4 days 6 tablet 0  . benzonatate (TESSALON) 200 MG capsule Take 1 capsule (200 mg total) by mouth 2 (two) times daily as needed for cough. 20 capsule 0  . Cetirizine HCl 10 MG CAPS Take by mouth.    . chlorpheniramine-HYDROcodone (TUSSIONEX PENNKINETIC ER) 10-8 MG/5ML SUER Take 5 mLs by mouth every 12 (twelve) hours as needed. 50 mL 0  . clonazePAM (KLONOPIN) 0.5 MG tablet Take 1 tablet (0.5 mg total) by mouth daily as needed for  anxiety. Take only for severe anxiety attacks 12 tablet 1  . Continuous Blood Gluc Sensor (DEXCOM G6 SENSOR) MISC SMARTSIG:1 Each Topical Every 10 Days    . Continuous Blood Gluc Transmit (DEXCOM G6 TRANSMITTER) MISC USE 1 EACH EVERY 3 (THREE) MONTHS    . CONTOUR NEXT TEST test strip     . famotidine (PEPCID) 20 MG tablet TAKE (1) TABLET BY MOUTH TWICE DAILY 180 tablet 1  . Insulin Glargine (LANTUS SOLOSTAR) 100 UNIT/ML Solostar Pen 50 units daily when off pump    . Insulin Pen Needle 29G X 12.7MM MISC 1 each by  Does not apply route 3 (three) times daily. 100 each 12  . lamoTRIgine (LAMICTAL) 25 MG tablet TAKE (3) TABLETS BY MOUTH EVERY DAY. 90 tablet 2  . Lancets (ONETOUCH DELICA PLUS [id number]) MISC     . lisinopril (ZESTRIL) 10 MG tablet Take 1 tablet (10 mg total) by mouth daily. 90 tablet 1  . NOVOLOG 100 UNIT/ML injection Inject into the skin.    [name] ondansetron (ZOFRAN ODT) 4 MG disintegrating tablet Take 1 tablet (4 mg total) by mouth every 8 (eight) hours as needed for nausea or vomiting. 30 tablet 0  . pantoprazole (PROTONIX) 40 MG tablet Take 1 tablet (40 mg total) by mouth daily. 90 tablet 3  . predniSONE (DELTASONE) 50 MG tablet Take 1 tablet (50 mg total) by mouth daily with breakfast. 5 tablet 0  . ranitidine (ZANTAC) 150 MG capsule Take by mouth daily.     . simethicone (GAS-X) 80 MG chewable tablet Chew 1 tablet (80 mg total) by mouth every 6 (six) hours as needed for flatulence (or cramping). 30 tablet 0   No current facility-administered medications for this visit.     Musculoskeletal: Strength & Muscle Tone: UTA Gait & Station: UTA Patient leans: N/A  Psychiatric Specialty Exam: Review of Systems  Psychiatric/Behavioral: Positive for hallucinations (Visual hallucinations of seeing spiders or other images when he is about to fall asleep-happens every few days-for a few seconds only and does not distress him.).  All other systems reviewed and are negative.   There were  no vitals taken for this visit.There is no height or weight on file to calculate BMI.  General Appearance: Casual  Eye Contact:  Fair  Speech:  Clear and Coherent  Volume:  Normal  Mood:  Euthymic  Affect:  Congruent  Thought Process:  Goal Directed and Descriptions of Associations: Intact  Orientation:  Full (Time, Place, and Person)  Thought Content: Hallucinations: Visual   Suicidal Thoughts:  No  Homicidal Thoughts:  No  Memory:  Immediate;   Fair Recent;   Fair Remote;   Fair  Judgement:  Fair  Insight:  Fair  Psychomotor Activity:  Normal  Concentration:  Concentration: Fair and Attention Span: Fair  Recall:  [employer] of Knowledge: Fair  Language: Fair  Akathisia:  No  Handed:  Right  AIMS (if indicated):UTA  Assets:  Communication Skills Desire for Improvement Housing Intimacy Social Support Talents/Skills Transportation Vocational/Educational  ADL's:  Intact  Cognition: WNL  Sleep:  Fair   Screenings: PHQ2-9   [place] Office Visit from 12/08/2019 in [place] Visit from 10/05/2019 in [place] Visit from 04/20/2019 in [place] [place] Visit from 01/05/2019 in [place] Visit from 11/24/2018 in [facility] [place]  PHQ-2 Total Score 0 0 1 0 0  PHQ-9 Total Score 0 3 7 0 --       Assessment and Plan: Brodie Correll is a 44 year old male, employed, married, lives in [place], has a history of bipolar disorder, anxiety disorder, diabetes, hypertension, hyperlipidemia was evaluated by telemedicine today.  Patient is currently making progress with regards to his mood.  Denies any spells since his last visit with Probation officer.  Plan as noted below.  Plan Bipolar disorder in remission Lamictal 75 mg p.o. daily-reduced dosage  GAD-stable Discontinue BuSpar for noncompliance.   Patient to continue to follow-up with Ms. Colleen Can for CBT  Tobacco use  disorder-unstable Provided counseling, patient is currently back on Chantix.  Patient to continue to follow-up with neurology for seizure-like spells or attacks.In a patient with spells or attacks or inability to move for few seconds , ? Cataplexy , hypnagogic hallucinations , discussed getting a sleep study done, he will have a discussion with neurologist.  Follow-up in clinic in 4 weeks or sooner if needed.  I have spent atleast 20 minutes face to face by video with patient today. More than 50 % of the time was spent for preparing to see the patient ( e.g., review of test, records ), ordering medications and test ,psychoeducation and supportive psychotherapy and care coordination,as well as documenting clinical information in electronic health record. This note was generated in part or whole with voice recognition software. Voice recognition is usually quite accurate but there are transcription errors that can and very often do occur. I apologize for any typographical errors that were not detected and corrected.      Ursula Alert, MD 01/19/2020, 8:19 AM

## 2020-01-20 ENCOUNTER — Telehealth: Payer: 59 | Admitting: Psychiatry

## 2020-02-23 ENCOUNTER — Other Ambulatory Visit: Payer: Self-pay

## 2020-02-23 MED ORDER — PANTOPRAZOLE SODIUM 40 MG PO TBEC
40.0000 mg | DELAYED_RELEASE_TABLET | Freq: Every day | ORAL | 0 refills | Status: DC
Start: 2020-02-23 — End: 2020-05-03

## 2020-02-23 NOTE — Telephone Encounter (Signed)
Patient last seen 11/02/19

## 2020-03-14 ENCOUNTER — Other Ambulatory Visit: Payer: Self-pay | Admitting: Psychiatry

## 2020-03-14 DIAGNOSIS — F3172 Bipolar disorder, in full remission, most recent episode hypomanic: Secondary | ICD-10-CM

## 2020-03-28 ENCOUNTER — Ambulatory Visit: Payer: Self-pay | Admitting: Family Medicine

## 2020-03-28 ENCOUNTER — Encounter: Payer: Self-pay | Admitting: Family Medicine

## 2020-03-28 ENCOUNTER — Telehealth (INDEPENDENT_AMBULATORY_CARE_PROVIDER_SITE_OTHER): Payer: Managed Care, Other (non HMO) | Admitting: Family Medicine

## 2020-03-28 ENCOUNTER — Telehealth: Payer: Self-pay | Admitting: Family Medicine

## 2020-03-28 VITALS — BP 130/86 | HR 110 | Temp 97.8°F

## 2020-03-28 DIAGNOSIS — J069 Acute upper respiratory infection, unspecified: Secondary | ICD-10-CM

## 2020-03-28 MED ORDER — PREDNISONE 10 MG PO TABS: ORAL_TABLET | ORAL | 0 refills | Status: DC

## 2020-03-28 MED ORDER — ALBUTEROL SULFATE HFA 108 (90 BASE) MCG/ACT IN AERS: 2.0000 | INHALATION_SPRAY | Freq: Four times a day (QID) | RESPIRATORY_TRACT | 2 refills | Status: DC | PRN

## 2020-03-28 MED ORDER — BENZONATATE 200 MG PO CAPS: 200.0000 mg | ORAL_CAPSULE | Freq: Two times a day (BID) | ORAL | 0 refills | Status: DC | PRN

## 2020-03-28 NOTE — Telephone Encounter (Signed)
Pt reports just had VV with Dr. Wynetta Emery, has tested neg for covid as discussed. States wife just came home and tested as she has lost her sense of taste and smell. Wife is positive. Pt states he is assuming he is also now; questioning if he should take the prednisone and other meds as ordered. Advised NT would route to practice for Dr. Durenda Age review.  CB# [phone]

## 2020-03-28 NOTE — Progress Notes (Signed)
BP 130/86   Pulse (!) 110   Temp 97.8 F (36.6 C)   SpO2 99%    Subjective:    Patient ID: Bernard [name], male    DOB: 1975/08/27, 45 y.o.   MRN: [id number]  HPI: Bernard [name] is a 45 y.o. male  Chief Complaint  Patient presents with  . chest congestion    Patient states for a few days has chest congestion, cough, sore throat and headache. Patient tested for covid last Thursday and Saturday both were negative.    UPPER RESPIRATORY TRACT INFECTION Duration: about 5 days Worst symptom: congestion, cough Fever: yes Cough: yes Shortness of breath: yes Wheezing: yes Chest pain: no Chest tightness: yes Chest congestion: yes Nasal congestion: yes Runny nose: yes Post nasal drip: yes Sneezing: no Sore throat: yes Swollen glands: no Sinus pressure: yes Headache: yes Face pain: yes Toothache: no Ear pain: no  Ear pressure: no  Eyes red/itching:no Eye drainage/crusting: no  Vomiting: no Rash: no Fatigue: yes Sick contacts: yes Strep contacts: no  Context: better Recurrent sinusitis: no Relief with OTC cold/cough medications: no  Treatments attempted: advil   Relevant past medical, surgical, family and social history reviewed and updated as indicated. Interim medical history since our last visit reviewed. Allergies and medications reviewed and updated.  Review of Systems  Constitutional: Negative.   HENT: Positive for congestion, postnasal drip, rhinorrhea, sinus pressure and sinus pain. Negative for dental problem, drooling, ear discharge, ear pain, facial swelling, hearing loss, mouth sores, nosebleeds, sneezing, sore throat, tinnitus, trouble swallowing and voice change.   Respiratory: Positive for cough and shortness of breath. Negative for apnea, choking, chest tightness, wheezing and stridor.   Cardiovascular: Negative.   Psychiatric/Behavioral: Negative.     Per HPI unless specifically indicated above     Objective:    BP 130/86    Pulse (!) 110   Temp 97.8 F (36.6 C)   SpO2 99%   Wt Readings from Last 3 Encounters:  11/02/19 198 lb (89.8 kg)  10/05/19 198 lb (89.8 kg)  05/22/19 210 lb (95.3 kg)    Physical Exam Vitals and nursing note reviewed.  Constitutional:      General: He is not in acute distress.    Appearance: Normal appearance. He is not ill-appearing, toxic-appearing or diaphoretic.  HENT:     Head: Normocephalic and atraumatic.     Right Ear: External ear normal.     Left Ear: External ear normal.     Nose: Nose normal.     Mouth/Throat:     Mouth: Mucous membranes are moist.     Pharynx: Oropharynx is clear.  Eyes:     General: No scleral icterus.       Right eye: No discharge.        Left eye: No discharge.     Conjunctiva/sclera: Conjunctivae normal.     Pupils: Pupils are equal, round, and reactive to light.  Pulmonary:     Effort: Pulmonary effort is normal. No respiratory distress.     Comments: Speaking in full sentences Musculoskeletal:        General: Normal range of motion.     Cervical back: Normal range of motion.  Skin:    Coloration: Skin is not jaundiced or pale.     Findings: No bruising, erythema, lesion or rash.  Neurological:     Mental Status: He is alert and oriented to person, place, and time. Mental status is at baseline.  Psychiatric:  Mood and Affect: Mood normal.        Behavior: Behavior normal.        Thought Content: Thought content normal.        Judgment: Judgment normal.     Results for orders placed or performed in visit on 01/05/20  HM DIABETES EYE EXAM  Result Value Ref Range   HM Diabetic Eye Exam Retinopathy (A) No Retinopathy      Assessment & Plan:   Problem List Items Addressed This Visit   None   Visit Diagnoses    Upper respiratory tract infection, unspecified type    -  Primary   Will treat with prednisone and tessalon. Call with any concerns or if not getting better and we'll call in Abx. Continue to monitor.         Follow up plan: Return if symptoms worsen or fail to improve.   . This visit was completed via MyChart due to the restrictions of the COVID-19 pandemic. All issues as above were discussed and addressed. Physical exam was done as above through visual confirmation on MyChart. If it was felt that the patient should be evaluated in the office, they were directed there. The patient verbally consented to this visit. . Location of the patient: home . Location of the provider: work . Those involved with this call:  . Provider: Park Liter, DO . CMA: Louanna Raw, [place] . Front Desk/Registration: Jill Side  . Time spent on call: 15 minutes with patient face to face via video conference. More than 50% of this time was spent in counseling and coordination of care. 23 minutes total spent in review of patient's record and preparation of their chart.

## 2020-03-29 NOTE — Telephone Encounter (Signed)
He can. They will not hurt him. If he'd like to come in for a swab, we can also arrange that.

## 2020-03-29 NOTE — Telephone Encounter (Signed)
Patient returned my call and was notified of results.

## 2020-03-29 NOTE — Telephone Encounter (Signed)
Called and LVM asking for patient to please return my call.  

## 2020-04-11 ENCOUNTER — Other Ambulatory Visit: Payer: Self-pay | Admitting: Psychiatry

## 2020-04-19 ENCOUNTER — Other Ambulatory Visit: Payer: Self-pay | Admitting: Psychiatry

## 2020-04-19 ENCOUNTER — Telehealth: Payer: Self-pay

## 2020-04-19 DIAGNOSIS — F411 Generalized anxiety disorder: Secondary | ICD-10-CM

## 2020-04-19 MED ORDER — BUSPIRONE HCL 10 MG PO TABS: 10.0000 mg | ORAL_TABLET | Freq: Three times a day (TID) | ORAL | 1 refills | Status: DC

## 2020-04-19 NOTE — Telephone Encounter (Signed)
Called patient , sent Buspar to pharmacy since he is back on it.

## 2020-04-19 NOTE — Telephone Encounter (Signed)
pt called states he needs a refill on his buspar. pt did make an appt but int is not until april.

## 2020-05-03 ENCOUNTER — Encounter: Payer: Self-pay | Admitting: Family Medicine

## 2020-05-03 ENCOUNTER — Ambulatory Visit (INDEPENDENT_AMBULATORY_CARE_PROVIDER_SITE_OTHER): Payer: Managed Care, Other (non HMO) | Admitting: Family Medicine

## 2020-05-03 ENCOUNTER — Other Ambulatory Visit: Payer: Self-pay

## 2020-05-03 VITALS — BP 113/65 | HR 87 | Temp 98.0°F | Ht 70.0 in | Wt 198.4 lb

## 2020-05-03 DIAGNOSIS — E1069 Type 1 diabetes mellitus with other specified complication: Secondary | ICD-10-CM

## 2020-05-03 DIAGNOSIS — G2581 Restless legs syndrome: Secondary | ICD-10-CM

## 2020-05-03 DIAGNOSIS — Z0000 Encounter for general adult medical examination without abnormal findings: Secondary | ICD-10-CM

## 2020-05-03 DIAGNOSIS — I10 Essential (primary) hypertension: Secondary | ICD-10-CM | POA: Diagnosis not present

## 2020-05-03 DIAGNOSIS — R109 Unspecified abdominal pain: Secondary | ICD-10-CM

## 2020-05-03 DIAGNOSIS — E7800 Pure hypercholesterolemia, unspecified: Secondary | ICD-10-CM | POA: Diagnosis not present

## 2020-05-03 DIAGNOSIS — E109 Type 1 diabetes mellitus without complications: Secondary | ICD-10-CM | POA: Diagnosis not present

## 2020-05-03 DIAGNOSIS — E785 Hyperlipidemia, unspecified: Secondary | ICD-10-CM

## 2020-05-03 DIAGNOSIS — K219 Gastro-esophageal reflux disease without esophagitis: Secondary | ICD-10-CM

## 2020-05-03 LAB — URINALYSIS, ROUTINE W REFLEX MICROSCOPIC
Bilirubin, UA: NEGATIVE
Glucose, UA: NEGATIVE
Ketones, UA: NEGATIVE
Leukocytes,UA: NEGATIVE
Nitrite, UA: NEGATIVE
Protein,UA: NEGATIVE
Specific Gravity, UA: <1.005 — ABNORMAL LOW (ref 1.005–1.030)
Urobilinogen, Ur: 0.2 mg/dL (ref 0.2–1.0)
pH, UA: 6.5 (ref 5.0–7.5)

## 2020-05-03 LAB — MICROALBUMIN, URINE WAIVED
Creatinine, Urine Waived: 50 mg/dL (ref 10–300)
Microalb, Ur Waived: 10 mg/L (ref 0–19)
Microalb/Creat Ratio: <30 mg/g (ref <30)

## 2020-05-03 LAB — MICROSCOPIC EXAMINATION
Bacteria, UA: NONE SEEN
Epithelial Cells (non renal): NONE SEEN /hpf (ref 0–10)
WBC, UA: NONE SEEN /hpf (ref 0–5)

## 2020-05-03 LAB — BAYER DCA HB A1C WAIVED: HB A1C (BAYER DCA - WAIVED): 7.3 % — ABNORMAL HIGH (ref <7.0)

## 2020-05-03 MED ORDER — PANTOPRAZOLE SODIUM 40 MG PO TBEC: 40.0000 mg | DELAYED_RELEASE_TABLET | Freq: Every day | ORAL | 1 refills | Status: DC

## 2020-05-03 MED ORDER — LISINOPRIL 10 MG PO TABS: 10.0000 mg | ORAL_TABLET | Freq: Every day | ORAL | 1 refills | Status: DC

## 2020-05-03 MED ORDER — ATORVASTATIN CALCIUM 40 MG PO TABS: 40.0000 mg | ORAL_TABLET | Freq: Every day | ORAL | 1 refills | Status: DC

## 2020-05-03 MED ORDER — SIMETHICONE 80 MG PO CHEW: 80.0000 mg | CHEWABLE_TABLET | Freq: Four times a day (QID) | ORAL | 3 refills | Status: DC | PRN

## 2020-05-03 MED ORDER — ROPINIROLE HCL 0.5 MG PO TABS: 0.5000 mg | ORAL_TABLET | Freq: Every day | ORAL | 1 refills | Status: DC

## 2020-05-03 MED ORDER — ALBUTEROL SULFATE HFA 108 (90 BASE) MCG/ACT IN AERS: 2.0000 | INHALATION_SPRAY | Freq: Four times a day (QID) | RESPIRATORY_TRACT | 6 refills | Status: DC | PRN

## 2020-05-03 MED ORDER — ONDANSETRON 4 MG PO TBDP: 4.0000 mg | ORAL_TABLET | Freq: Three times a day (TID) | ORAL | 1 refills | Status: DC | PRN

## 2020-05-03 NOTE — Progress Notes (Signed)
BP 113/65   Pulse 87   Temp 98 F (36.7 C)   Ht 5' 10"  (1.778 m)   Wt 198 lb 6.4 oz (90 kg)   SpO2 98%   BMI 28.47 kg/m    Subjective:    Patient ID: Bernard [name], male    DOB: 1975-10-13, 45 y.o.   MRN: [id number]  HPI: Bernard [name] is a 45 y.o. male presenting on 05/03/2020 for comprehensive medical examination. Current medical complaints include:  SMOKING CESSATION Smoking Status: every day smoker Smoking Amount: 1/4 ppd Smoking Quit Date: not set Smoking triggers: stress Type of tobacco use: cigarettes Children in the house: yes Other household members who smoke: no Treatments attempted: chantix, wellbutrin, nicotine replacement Pneumovax: UTD  HYPERTENSION / HYPERLIPIDEMIA Satisfied with current treatment? yes Duration of hypertension: chronic BP monitoring frequency: not checking BP medication side effects: no Past BP meds: lisinopril Duration of hyperlipidemia: chronic Cholesterol medication side effects: no Cholesterol supplements: none Past cholesterol medications: atorvastatin Medication compliance: excellent compliance Aspirin: no Recent stressors: no Recurrent headaches: no Visual changes: no Palpitations: no Dyspnea: no Chest pain: no Lower extremity edema: no Dizzy/lightheaded: no  DIABETES Hypoglycemic episodes:no Polydipsia/polyuria: no Visual disturbance: no Chest pain: no Paresthesias: no Glucose Monitoring: yes  Accucheck frequency: continuous Taking Insulin?: yes Blood Pressure Monitoring: not checking Retinal Examination: Up to Date Foot Exam: Up to Date Diabetic Education: Completed Pneumovax: Up to Date Influenza: Up to Date Aspirin: no  RESTLESS LEGS Duration: months Discomfort description:  Creeping and crawling Pain: no Location: lower legs Bilateral: yes Symmetric: yes Severity: mild Onset:  gradual Frequency:  at night Symptoms only occur while legs at rest: yes Sudden unintentional leg  jerking: no Bed partner bothered by leg movements: no LE numbness: no Decreased sensation: no Weakness: no Insomnia: no Daytime somnolence: no Fatigue: no Status: worse  He currently lives with: wife and kids Interim Problems from his last visit: no  Depression Screen done today and results listed below:  Depression screen [facility] 2/9 05/03/2020 12/08/2019 10/05/2019 04/20/2019 04/20/2019  Decreased Interest 0 0 0 0 -  Down, Depressed, Hopeless 0 0 0 1 1  PHQ - 2 Score 0 0 0 1 1  Altered sleeping 0 0 1 3 -  Tired, decreased energy 3 0 2 3 -  Change in appetite 0 0 0 0 -  Feeling bad or failure about yourself  0 0 0 0 -  Trouble concentrating 0 0 0 0 -  Moving slowly or fidgety/restless 0 0 0 0 -  Suicidal thoughts 0 0 0 0 -  PHQ-9 Score 3 0 3 7 -  Difficult doing work/chores Not difficult at all Not difficult at all Not difficult at all Not difficult at all -    Past Medical History:  Past Medical History:  Diagnosis Date  . Anxiety   . Depression   . Esophageal stricture   . Hyperlipidemia   . Hypertension   . Weight loss     Surgical History:  Past Surgical History:  Procedure Laterality Date  . PILONIDAL CYST EXCISION    . WISDOM TOOTH EXTRACTION      Medications:  Current Outpatient Medications on File Prior to Visit  Medication Sig  . busPIRone (BUSPAR) 10 MG tablet Take 1 tablet (10 mg total) by mouth 3 (three) times daily.  . Cetirizine HCl 10 MG CAPS Take by mouth.  . Continuous Blood Gluc Sensor (DEXCOM G6 SENSOR) MISC SMARTSIG:1 Each Topical Every 10 Days  .  Continuous Blood Gluc Transmit (DEXCOM G6 TRANSMITTER) MISC USE 1 EACH EVERY 3 (THREE) MONTHS  . CONTOUR NEXT TEST test strip   . insulin glargine (LANTUS) 100 UNIT/ML Solostar Pen 50 units daily when off pump  . Insulin Pen Needle 29G X 12.7MM MISC 1 each by Does not apply route 3 (three) times daily.  [name] lamoTRIgine (LAMICTAL) 25 MG tablet TAKE (3) TABLETS BY MOUTH EVERY DAY.  [name] Lancets (ONETOUCH DELICA  PLUS [id number]) MISC   . NOVOLOG 100 UNIT/ML injection Inject into the skin.  . ranitidine (ZANTAC) 150 MG capsule Take by mouth daily.   . clonazePAM (KLONOPIN) 0.5 MG tablet Take 1 tablet (0.5 mg total) by mouth daily as needed for anxiety. Take only for severe anxiety attacks (Patient not taking: No sig reported)   No current facility-administered medications on file prior to visit.    Allergies:  No Known Allergies  Social History:  Social History   Socioeconomic History  . Marital status: Married    Spouse name: joan   . Number of children: 2  . Years of education: Not on file  . Highest education level: Bachelor's degree (e.g., BA, AB, BS)  Occupational History  . Not on file  Tobacco Use  . Smoking status: Current Every Day Smoker    Packs/day: 0.25    Types: Cigarettes    Start date: 01/22/2017  . Smokeless tobacco: Never Used  . Tobacco comment: started back again, 1/3 pack per day- reported 11/17/2019  Vaping Use  . Vaping Use: Never used  Substance and Sexual Activity  . Alcohol use: Yes    Alcohol/week: 2.0 - 6.0 standard drinks    Types: 2 - 6 Cans of beer per week    Comment: on occasion  . Drug use: No  . Sexual activity: Yes    Birth control/protection: None  Other Topics Concern  . Not on file  Social History Narrative  . Not on file   Social Determinants of Health   Financial Resource Strain: Not on file  Food Insecurity: Not on file  Transportation Needs: Not on file  Physical Activity: Not on file  Stress: Not on file  Social Connections: Not on file  Intimate Partner Violence: Not on file   Social History   Tobacco Use  Smoking Status Current Every Day Smoker  . Packs/day: 0.25  . Types: Cigarettes  . Start date: 01/22/2017  Smokeless Tobacco Never Used  Tobacco Comment   started back again, 1/3 pack per day- reported 11/17/2019   Social History   Substance and Sexual Activity  Alcohol Use Yes  . Alcohol/week: 2.0 - 6.0  standard drinks  . Types: 2 - 6 Cans of beer per week   Comment: on occasion    Family History:  Family History  Problem Relation Age of Onset  . Cancer Mother   . Anxiety disorder Mother   . Depression Mother   . Cancer Father   . Anxiety disorder Brother   . Depression Brother     Past medical history, surgical history, medications, allergies, family history and social history reviewed with patient today and changes made to appropriate areas of the chart.   Review of Systems  Constitutional: Negative.   HENT: Negative.   Eyes: Negative.   Respiratory: Negative.   Cardiovascular: Negative.   Gastrointestinal: Positive for heartburn and nausea. Negative for abdominal pain, blood in stool, constipation, diarrhea, melena and vomiting.  Genitourinary: Negative.   Musculoskeletal: Negative.   Skin:  Negative.   Neurological: Negative.   Endo/Heme/Allergies: Positive for polydipsia. Negative for environmental allergies. Does not bruise/bleed easily.  Psychiatric/Behavioral: Negative.    All other ROS negative except what is listed above and in the HPI.      Objective:    BP 113/65   Pulse 87   Temp 98 F (36.7 C)   Ht 5' 10"  (1.778 m)   Wt 198 lb 6.4 oz (90 kg)   SpO2 98%   BMI 28.47 kg/m   Wt Readings from Last 3 Encounters:  05/03/20 198 lb 6.4 oz (90 kg)  11/02/19 198 lb (89.8 kg)  10/05/19 198 lb (89.8 kg)    Physical Exam Vitals and nursing note reviewed.  Constitutional:      General: He is not in acute distress.    Appearance: Normal appearance. He is not ill-appearing, toxic-appearing or diaphoretic.  HENT:     Head: Normocephalic and atraumatic.     Right Ear: Tympanic membrane, ear canal and external ear normal. There is no impacted cerumen.     Left Ear: Tympanic membrane, ear canal and external ear normal. There is no impacted cerumen.     Nose: Nose normal. No congestion or rhinorrhea.     Mouth/Throat:     Mouth: Mucous membranes are moist.      Pharynx: Oropharynx is clear. No oropharyngeal exudate or posterior oropharyngeal erythema.  Eyes:     General: No scleral icterus.       Right eye: No discharge.        Left eye: No discharge.     Extraocular Movements: Extraocular movements intact.     Conjunctiva/sclera: Conjunctivae normal.     Pupils: Pupils are equal, round, and reactive to light.  Neck:     Vascular: No carotid bruit.  Cardiovascular:     Rate and Rhythm: Normal rate and regular rhythm.     Pulses: Normal pulses.     Heart sounds: No murmur heard. No friction rub. No gallop.   Pulmonary:     Effort: Pulmonary effort is normal. No respiratory distress.     Breath sounds: Normal breath sounds. No stridor. No wheezing, rhonchi or rales.  Chest:     Chest wall: No tenderness.  Abdominal:     General: Abdomen is flat. Bowel sounds are normal. There is no distension.     Palpations: Abdomen is soft. There is no mass.     Tenderness: There is no abdominal tenderness. There is no right CVA tenderness, left CVA tenderness, guarding or rebound.     Hernia: No hernia is present.  Genitourinary:    Comments: Genital exam deferred with shared decision making Musculoskeletal:        General: No swelling, tenderness, deformity or signs of injury.     Cervical back: Normal range of motion and neck supple. No rigidity. No muscular tenderness.     Right lower leg: No edema.     Left lower leg: No edema.  Lymphadenopathy:     Cervical: No cervical adenopathy.  Skin:    General: Skin is warm and dry.     Capillary Refill: Capillary refill takes less than 2 seconds.     Coloration: Skin is not jaundiced or pale.     Findings: No bruising, erythema, lesion or rash.  Neurological:     General: No focal deficit present.     Mental Status: He is alert and oriented to person, place, and time.     Cranial  Nerves: No cranial nerve deficit.     Sensory: No sensory deficit.     Motor: No weakness.     Coordination: Coordination  normal.     Gait: Gait normal.     Deep Tendon Reflexes: Reflexes normal.  Psychiatric:        Mood and Affect: Mood normal.        Behavior: Behavior normal.        Thought Content: Thought content normal.        Judgment: Judgment normal.     Results for orders placed or performed in visit on 05/03/20  Microscopic Examination   BLD  Result Value Ref Range   WBC, UA None seen 0 - 5 /hpf   RBC 3-10 (A) 0 - 2 /hpf   Epithelial Cells (non renal) None seen 0 - 10 /hpf   Bacteria, UA None seen None seen/Few  Bayer DCA Hb A1c Waived  Result Value Ref Range   HB A1C (BAYER DCA - WAIVED) 7.3 (H) <7.0 %  Comprehensive metabolic panel  Result Value Ref Range   Glucose 133 (H) 65 - 99 mg/dL   BUN 12 6 - 24 mg/dL   Creatinine, Ser 1.03 0.76 - 1.27 mg/dL   eGFR 92 >59 mL/min/1.73   BUN/Creatinine Ratio 12 9 - 20   Sodium 143 134 - 144 mmol/L   Potassium 4.3 3.5 - 5.2 mmol/L   Chloride 105 96 - 106 mmol/L   CO2 23 20 - 29 mmol/L   Calcium 9.2 8.7 - 10.2 mg/dL   Total Protein 6.3 6.0 - 8.5 g/dL   Albumin 4.5 4.0 - 5.0 g/dL   Globulin, Total 1.8 1.5 - 4.5 g/dL   Albumin/Globulin Ratio 2.5 (H) 1.2 - 2.2   Bilirubin Total 0.5 0.0 - 1.2 mg/dL   Alkaline Phosphatase 65 44 - 121 IU/L   AST 27 0 - 40 IU/L   ALT 46 (H) 0 - 44 IU/L  CBC with Differential/Platelet  Result Value Ref Range   WBC 6.7 3.4 - 10.8 x10E3/uL   RBC 5.04 4.14 - 5.80 x10E6/uL   Hemoglobin 15.6 13.0 - 17.7 g/dL   Hematocrit 44.8 37.5 - 51.0 %   MCV 89 79 - 97 fL   MCH 31.0 26.6 - 33.0 pg   MCHC 34.8 31.5 - 35.7 g/dL   RDW 12.6 11.6 - 15.4 %   Platelets 246 150 - 450 x10E3/uL   Neutrophils 62 Not Estab. %   Lymphs 24 Not Estab. %   Monocytes 7 Not Estab. %   Eos 5 Not Estab. %   Basos 1 Not Estab. %   Neutrophils Absolute 4.1 1.4 - 7.0 x10E3/uL   Lymphocytes Absolute 1.6 0.7 - 3.1 x10E3/uL   Monocytes Absolute 0.5 0.1 - 0.9 x10E3/uL   EOS (ABSOLUTE) 0.4 0.0 - 0.4 x10E3/uL   Basophils Absolute 0.1 0.0 - 0.2  x10E3/uL   Immature Granulocytes 1 Not Estab. %   Immature Grans (Abs) 0.1 0.0 - 0.1 x10E3/uL  Lipid Panel w/o Chol/HDL Ratio  Result Value Ref Range   Cholesterol, Total 133 100 - 199 mg/dL   Triglycerides 59 0 - 149 mg/dL   HDL 55 >39 mg/dL   VLDL Cholesterol Cal 13 5 - 40 mg/dL   LDL Chol Calc (NIH) 65 0 - 99 mg/dL  TSH  Result Value Ref Range   TSH 2.000 0.450 - 4.500 uIU/mL  Urinalysis, Routine w reflex microscopic  Result Value Ref Range   Specific  Gravity, UA <1.005 (L) 1.005 - 1.030   pH, UA 6.5 5.0 - 7.5   Color, UA Yellow Yellow   Appearance Ur Clear Clear   Leukocytes,UA Negative Negative   Protein,UA Negative Negative/Trace   Glucose, UA Negative Negative   Ketones, UA Negative Negative   RBC, UA 2+ (A) Negative   Bilirubin, UA Negative Negative   Urobilinogen, Ur 0.2 0.2 - 1.0 mg/dL   Nitrite, UA Negative Negative   Microscopic Examination See below:   Microalbumin, Urine Waived  Result Value Ref Range   Microalb, Ur Waived 10 0 - 19 mg/L   Creatinine, Urine Waived 50 10 - 300 mg/dL   Microalb/Creat Ratio <30 <30 mg/g      Assessment & Plan:   Problem List Items Addressed This Visit      Cardiovascular and Mediastinum   Hypertension    Under good control on current regimen. Continue current regimen. Continue to monitor. Call with any concerns. Refills given. Labs drawn today.        Relevant Medications   atorvastatin (LIPITOR) 40 MG tablet   lisinopril (ZESTRIL) 10 MG tablet     Digestive   Gastroesophageal reflux disease without esophagitis    Under good control on current regimen. Continue current regimen. Continue to monitor. Call with any concerns. Refills given. Labs drawn today.       Relevant Medications   pantoprazole (PROTONIX) 40 MG tablet   ondansetron (ZOFRAN ODT) 4 MG disintegrating tablet   simethicone (GAS-X) 80 MG chewable tablet     Endocrine   DM type 1 (diabetes mellitus, type 1) (HCC)    Elevated at 7.3- will watch his  diet and get back in with his endocrinologist. Continue to monitor. Call with any concerns.       Relevant Medications   atorvastatin (LIPITOR) 40 MG tablet   lisinopril (ZESTRIL) 10 MG tablet   Other Relevant Orders   Bayer DCA Hb A1c Waived (Completed)   Comprehensive metabolic panel (Completed)   CBC with Differential/Platelet (Completed)   Urinalysis, Routine w reflex microscopic (Completed)   Microalbumin, Urine Waived (Completed)   Hyperlipidemia due to type 1 diabetes mellitus ([place])    Under good control on current regimen. Continue current regimen. Continue to monitor. Call with any concerns. Refills given. Labs drawn today.       Relevant Medications   atorvastatin (LIPITOR) 40 MG tablet   lisinopril (ZESTRIL) 10 MG tablet     Other   Hyperlipidemia    Under good control on current regimen. Continue current regimen. Continue to monitor. Call with any concerns. Refills given. Labs drawn today.       Relevant Medications   atorvastatin (LIPITOR) 40 MG tablet   lisinopril (ZESTRIL) 10 MG tablet   Other Relevant Orders   Comprehensive metabolic panel (Completed)   CBC with Differential/Platelet (Completed)   Lipid Panel w/o Chol/HDL Ratio (Completed)   RLS (restless legs syndrome)    Will start requip. Call with any concerns or if not improving. Labs drawn today.      RESOLVED: Abdominal cramping   Relevant Medications   simethicone (GAS-X) 80 MG chewable tablet    Other Visit Diagnoses    Routine general medical examination at a health care facility    -  Primary   Vaccines up to date. Screening labs checked today. Continue diet and exercise. Continue to monitor. Call with any concerns.    Relevant Orders   Bayer DCA Hb A1c Waived (Completed)  Comprehensive metabolic panel (Completed)   CBC with Differential/Platelet (Completed)   Lipid Panel w/o Chol/HDL Ratio (Completed)   TSH (Completed)   Urinalysis, Routine w reflex microscopic (Completed)   Microalbumin,  Urine Waived (Completed)   Essential hypertension       Relevant Medications   atorvastatin (LIPITOR) 40 MG tablet   lisinopril (ZESTRIL) 10 MG tablet   Other Relevant Orders   Comprehensive metabolic panel (Completed)   CBC with Differential/Platelet (Completed)   TSH (Completed)   Microalbumin, Urine Waived (Completed)       Discussed aspirin prophylaxis for myocardial infarction prevention and decision was it was not indicated  LABORATORY TESTING:  Health maintenance labs ordered today as discussed above.   IMMUNIZATIONS:   - Tdap: Tetanus vaccination status reviewed: last tetanus booster within 10 years. - Influenza: Up to date - Pneumovax: Up to date - Prevnar: Up to date - COVID: Up to date  PATIENT COUNSELING:    Sexuality: Discussed sexually transmitted diseases, partner selection, use of condoms, avoidance of unintended pregnancy  and contraceptive alternatives.   Advised to avoid cigarette smoking.  I discussed with the patient that most people either abstain from alcohol or drink within safe limits (<=14/week and <=4 drinks/occasion for males, <=7/weeks and <= 3 drinks/occasion for females) and that the risk for alcohol disorders and other health effects rises proportionally with the number of drinks per week and how often a drinker exceeds daily limits.  Discussed cessation/primary prevention of drug use and availability of treatment for abuse.   Diet: Encouraged to adjust caloric intake to maintain  or achieve ideal body weight, to reduce intake of dietary saturated fat and total fat, to limit sodium intake by avoiding high sodium foods and not adding table salt, and to maintain adequate dietary potassium and calcium preferably from fresh fruits, vegetables, and low-fat dairy products.    stressed the importance of regular exercise  Injury prevention: Discussed safety belts, safety helmets, smoke detector, smoking near bedding or upholstery.   Dental health:  Discussed importance of regular tooth brushing, flossing, and dental visits.   Follow up plan: NEXT PREVENTATIVE PHYSICAL DUE IN 1 YEAR. Return in about 6 months (around 11/03/2020).

## 2020-05-04 LAB — CBC WITH DIFFERENTIAL/PLATELET
Basophils Absolute: 0.1 10*3/uL (ref 0.0–0.2)
Basos: 1 %
EOS (ABSOLUTE): 0.4 10*3/uL (ref 0.0–0.4)
Eos: 5 %
Hematocrit: 44.8 % (ref 37.5–51.0)
Hemoglobin: 15.6 g/dL (ref 13.0–17.7)
Immature Grans (Abs): 0.1 10*3/uL (ref 0.0–0.1)
Immature Granulocytes: 1 %
Lymphocytes Absolute: 1.6 10*3/uL (ref 0.7–3.1)
Lymphs: 24 %
MCH: 31 pg (ref 26.6–33.0)
MCHC: 34.8 g/dL (ref 31.5–35.7)
MCV: 89 fL (ref 79–97)
Monocytes Absolute: 0.5 10*3/uL (ref 0.1–0.9)
Monocytes: 7 %
Neutrophils Absolute: 4.1 10*3/uL (ref 1.4–7.0)
Neutrophils: 62 %
Platelets: 246 10*3/uL (ref 150–450)
RBC: 5.04 x10E6/uL (ref 4.14–5.80)
RDW: 12.6 % (ref 11.6–15.4)
WBC: 6.7 10*3/uL (ref 3.4–10.8)

## 2020-05-04 LAB — COMPREHENSIVE METABOLIC PANEL
ALT: 46 IU/L — ABNORMAL HIGH (ref 0–44)
AST: 27 IU/L (ref 0–40)
Albumin/Globulin Ratio: 2.5 — ABNORMAL HIGH (ref 1.2–2.2)
Albumin: 4.5 g/dL (ref 4.0–5.0)
Alkaline Phosphatase: 65 IU/L (ref 44–121)
BUN/Creatinine Ratio: 12 (ref 9–20)
BUN: 12 mg/dL (ref 6–24)
Bilirubin Total: 0.5 mg/dL (ref 0.0–1.2)
CO2: 23 mmol/L (ref 20–29)
Calcium: 9.2 mg/dL (ref 8.7–10.2)
Chloride: 105 mmol/L (ref 96–106)
Creatinine, Ser: 1.03 mg/dL (ref 0.76–1.27)
Globulin, Total: 1.8 g/dL (ref 1.5–4.5)
Glucose: 133 mg/dL — ABNORMAL HIGH (ref 65–99)
Potassium: 4.3 mmol/L (ref 3.5–5.2)
Sodium: 143 mmol/L (ref 134–144)
Total Protein: 6.3 g/dL (ref 6.0–8.5)
eGFR: 92 mL/min/{1.73_m2} (ref >59)

## 2020-05-04 LAB — TSH: TSH: 2 u[IU]/mL (ref 0.450–4.500)

## 2020-05-04 LAB — LIPID PANEL W/O CHOL/HDL RATIO
Cholesterol, Total: 133 mg/dL (ref 100–199)
HDL: 55 mg/dL (ref >39)
LDL Chol Calc (NIH): 65 mg/dL (ref 0–99)
Triglycerides: 59 mg/dL (ref 0–149)
VLDL Cholesterol Cal: 13 mg/dL (ref 5–40)

## 2020-05-06 ENCOUNTER — Encounter: Payer: Self-pay | Admitting: Family Medicine

## 2020-05-06 DIAGNOSIS — G2581 Restless legs syndrome: Secondary | ICD-10-CM | POA: Insufficient documentation

## 2020-05-06 NOTE — Assessment & Plan Note (Signed)
Under good control on current regimen. Continue current regimen. Continue to monitor. Call with any concerns. Refills given. Labs drawn today.   

## 2020-05-06 NOTE — Assessment & Plan Note (Signed)
Elevated at 7.3- will watch his diet and get back in with his endocrinologist. Continue to monitor. Call with any concerns.

## 2020-05-06 NOTE — Assessment & Plan Note (Signed)
Will start requip. Call with any concerns or if not improving. Labs drawn today.

## 2020-05-09 ENCOUNTER — Other Ambulatory Visit: Payer: Self-pay

## 2020-05-09 ENCOUNTER — Telehealth (INDEPENDENT_AMBULATORY_CARE_PROVIDER_SITE_OTHER): Payer: 59 | Admitting: Psychiatry

## 2020-05-09 ENCOUNTER — Encounter: Payer: Self-pay | Admitting: Psychiatry

## 2020-05-09 DIAGNOSIS — F3172 Bipolar disorder, in full remission, most recent episode hypomanic: Secondary | ICD-10-CM | POA: Diagnosis not present

## 2020-05-09 DIAGNOSIS — F17200 Nicotine dependence, unspecified, uncomplicated: Secondary | ICD-10-CM

## 2020-05-09 DIAGNOSIS — F411 Generalized anxiety disorder: Secondary | ICD-10-CM

## 2020-05-09 NOTE — Progress Notes (Signed)
Virtual Visit via Video Note  I connected with Bernard [name] on 05/09/20 at 10:20 AM EDT by a video enabled telemedicine application and verified that I am speaking with the correct person using two identifiers.  Location Provider Location : [facility] Patient Location : Home  Participants: Patient , Provider   I discussed the limitations of evaluation and management by telemedicine and the availability of in person appointments. The patient expressed understanding and agreed to proceed.     I discussed the assessment and treatment plan with the patient. The patient was provided an opportunity to ask questions and all were answered. The patient agreed with the plan and demonstrated an understanding of the instructions.   The patient was advised to call back or seek an in-person evaluation if the symptoms worsen or if the condition fails to improve as anticipated.   [place] MD OP Progress Note  05/09/2020 5:04 PM Bernard [name]  MRN:  [id number]  Chief Complaint:  Chief Complaint    Follow-up; Anxiety     HPI: Bernard [name] is a 45 year old Caucasian male, employed, lives in [place], has a history of bipolar disorder, GAD, tobacco use disorder was evaluated by telemedicine today.  Patient today reports he is currently going through situational stressors.  Patient reports he is currently going through job related stressors.  He also cannot follow-up with his previous counselor anymore.  He reports he was provided a Company secretary by his health insurance plan.  He really liked this person however patient reports he got a message recently that his current counselor is also having some trouble with billing and he is currently trying to get that corrected.  He reports he wants to continue to be in therapy and will continue to work on finding a therapist.  Patient reports even though he has anxiety symptoms and feels stressed out often he has been able to cope with it good.  It  does not overwhelm him.  The BuSpar does help.  Denies side effects.  He reports sleep is overall okay.  Patient does report having visual hallucinations, which happens only when he is about to go to bed or about to wake up.  He sees spiders which lasts for a few seconds.  It used to happen almost every day in the past.  It currently happens every 2 weeks or so.  It is getting better.  Patient denies any suicidality or homicidality.  Patient is compliant on medications.  Denies side effects.  Patient denies any other concerns today.  Visit Diagnosis:    ICD-10-CM   1. Bipolar disorder, in full remission, most recent episode hypomanic ([place])  F31.72   2. GAD (generalized anxiety disorder)  F41.1   3. Tobacco use disorder  F17.200     Past Psychiatric History: I have reviewed past psychiatric history from my progress note on 02/18/2018.  Past Medical History:  Past Medical History:  Diagnosis Date  . Anxiety   . Depression   . Esophageal stricture   . Hyperlipidemia   . Hypertension   . Weight loss     Past Surgical History:  Procedure Laterality Date  . PILONIDAL CYST EXCISION    . WISDOM TOOTH EXTRACTION      Family Psychiatric History: I have reviewed family psychiatric history from my progress note on 02/18/2018.  Family History:  Family History  Problem Relation Age of Onset  . Cancer Mother   . Anxiety disorder Mother   . Depression Mother   .  Cancer Father   . Anxiety disorder Brother   . Depression Brother     Social History: I have reviewed social history from my progress note on 02/18/2018. Social History   Socioeconomic History  . Marital status: Married    Spouse name: joan   . Number of children: 2  . Years of education: Not on file  . Highest education level: Bachelor's degree (e.g., BA, AB, BS)  Occupational History  . Not on file  Tobacco Use  . Smoking status: Current Every Day Smoker    Packs/day: 0.25    Types: Cigarettes    Start date:  01/22/2017  . Smokeless tobacco: Never Used  . Tobacco comment: started back again, 1/3 pack per day- reported 11/17/2019  Vaping Use  . Vaping Use: Never used  Substance and Sexual Activity  . Alcohol use: Yes    Alcohol/week: 2.0 - 6.0 standard drinks    Types: 2 - 6 Cans of beer per week    Comment: on occasion  . Drug use: No  . Sexual activity: Yes    Birth control/protection: None  Other Topics Concern  . Not on file  Social History Narrative  . Not on file   Social Determinants of Health   Financial Resource Strain: Not on file  Food Insecurity: Not on file  Transportation Needs: Not on file  Physical Activity: Not on file  Stress: Not on file  Social Connections: Not on file    Allergies: No Known Allergies  Metabolic Disorder Labs: Lab Results  Component Value Date   HGBA1C 7.3 (H) 05/03/2020   No results found for: PROLACTIN Lab Results  Component Value Date   CHOL 133 05/03/2020   TRIG 59 05/03/2020   HDL 55 05/03/2020   CHOLHDL 3.3 04/17/2018   VLDL 13 08/06/2016   LDLCALC 65 05/03/2020   LDLCALC 59 11/02/2019   Lab Results  Component Value Date   TSH 2.000 05/03/2020   TSH 1.950 04/20/2019    Therapeutic Level Labs: No results found for: LITHIUM No results found for: VALPROATE No components found for:  CBMZ  Current Medications: Current Outpatient Medications  Medication Sig Dispense Refill  . albuterol (VENTOLIN HFA) 108 (90 Base) MCG/ACT inhaler Inhale 2 puffs into the lungs every 6 (six) hours as needed for wheezing or shortness of breath. 8 g 6  . atorvastatin (LIPITOR) 40 MG tablet Take 1 tablet (40 mg total) by mouth daily. 90 tablet 1  . busPIRone (BUSPAR) 10 MG tablet Take 1 tablet (10 mg total) by mouth 3 (three) times daily. 90 tablet 1  . Cetirizine HCl 10 MG CAPS Take by mouth.    . clonazePAM (KLONOPIN) 0.5 MG tablet Take 1 tablet (0.5 mg total) by mouth daily as needed for anxiety. Take only for severe anxiety attacks (Patient  not taking: No sig reported) 12 tablet 1  . Continuous Blood Gluc Sensor (DEXCOM G6 SENSOR) MISC SMARTSIG:1 Each Topical Every 10 Days    . Continuous Blood Gluc Transmit (DEXCOM G6 TRANSMITTER) MISC USE 1 EACH EVERY 3 (THREE) MONTHS    . CONTOUR NEXT TEST test strip     . insulin glargine (LANTUS) 100 UNIT/ML Solostar Pen 50 units daily when off pump    . Insulin Pen Needle 29G X 12.7MM MISC 1 each by Does not apply route 3 (three) times daily. 100 each 12  . lamoTRIgine (LAMICTAL) 25 MG tablet TAKE (3) TABLETS BY MOUTH EVERY DAY. 90 tablet 2  . Lancets (  ONETOUCH DELICA PLUS [id number]) [place]     . lisinopril (ZESTRIL) 10 MG tablet Take 1 tablet (10 mg total) by mouth daily. 90 tablet 1  . NOVOLOG 100 UNIT/ML injection Inject into the skin.    [name] ondansetron (ZOFRAN ODT) 4 MG disintegrating tablet Take 1 tablet (4 mg total) by mouth every 8 (eight) hours as needed for nausea or vomiting. 90 tablet 1  . pantoprazole (PROTONIX) 40 MG tablet Take 1 tablet (40 mg total) by mouth daily. 90 tablet 1  . ranitidine (ZANTAC) 150 MG capsule Take by mouth daily.     [name] rOPINIRole (REQUIP) 0.5 MG tablet Take 1 tablet (0.5 mg total) by mouth at bedtime. 90 tablet 1  . simethicone (GAS-X) 80 MG chewable tablet Chew 1 tablet (80 mg total) by mouth every 6 (six) hours as needed for flatulence (or cramping). 90 tablet 3   No current facility-administered medications for this visit.     Musculoskeletal: Strength & Muscle Tone: UTA Gait & Station: UTA Patient leans: N/A  Psychiatric Specialty Exam: Review of Systems  Psychiatric/Behavioral: Positive for hallucinations. The patient is nervous/anxious.   All other systems reviewed and are negative.   There were no vitals taken for this visit.There is no height or weight on file to calculate BMI.  General Appearance: Casual  Eye Contact:  Fair  Speech:  Clear and Coherent  Volume:  Normal  Mood:  Anxious  Affect:  Congruent  Thought Process:  Goal  Directed and Descriptions of Associations: Intact  Orientation:  Full (Time, Place, and Person)  Thought Content: Possible VH , unsure if true psychosis - sees spiders as noted above   Suicidal Thoughts:  No  Homicidal Thoughts:  No  Memory:  Immediate;   Fair Recent;   Fair Remote;   Fair  Judgement:  Fair  Insight:  Fair  Psychomotor Activity:  Normal  Concentration:  Concentration: Fair and Attention Span: Fair  Recall:  [employer] of Knowledge: Fair  Language: Fair  Akathisia:  No  Handed:  Right  AIMS (if indicated): UTA  Assets:  Communication Skills Desire for Improvement Housing Social Support  ADL's:  Intact  Cognition: WNL  Sleep:  Fair   Screenings: GAD-7   Flowsheet Row Video Visit from 05/09/2020 in [facility]  Total GAD-7 Score 4    PHQ2-9   [place] Video Visit from 05/09/2020 in [facility] Office Visit from 05/03/2020 in [facility] Visit from 12/08/2019 in [facility] Visit from 10/05/2019 in [facility] Visit from 04/20/2019 in [facility]  PHQ-2 Total Score 1 0 0 0 1  PHQ-9 Total Score -- 3 0 3 7    Flowsheet Row Video Visit from 05/09/2020 in [facility] No Risk       Assessment and Plan: Bernard [name] is a 45 year old male, employed, married, lives in [place], has a history of bipolar disorder, anxiety disorder, diabetes, hypertension, hyperlipidemia was evaluated by telemedicine today.  Patient is currently making progress.  Plan as noted below.  Plan Bipolar disorder in remission Lamictal 75 mg p.o. daily-reduced dosage.  GAD-stable Continue BuSpar 10 mg p.o. 3 times daily Patient to continue CBT, he is currently in the process of finding a new therapist.  Tobacco use disorder-improving Provided counseling.  Patient reports he is planning to start smoking  cessation classes.  Patient will continue to benefit from follow-up  appointments.  Follow-up in 2 months or sooner if needed.  This note was generated in part or whole with voice recognition software. Voice recognition is usually quite accurate but there are transcription errors that can and very often do occur. I apologize for any typographical errors that were not detected and corrected.        Ursula Alert, MD 05/09/2020, 5:04 PM

## 2020-06-22 ENCOUNTER — Other Ambulatory Visit: Payer: Self-pay | Admitting: Psychiatry

## 2020-06-22 DIAGNOSIS — F3172 Bipolar disorder, in full remission, most recent episode hypomanic: Secondary | ICD-10-CM

## 2020-06-29 ENCOUNTER — Other Ambulatory Visit: Payer: Self-pay | Admitting: Family Medicine

## 2020-06-29 NOTE — Telephone Encounter (Signed)
Scheduled 9/23

## 2020-06-29 NOTE — Telephone Encounter (Signed)
Requested medication (s) are due for refill today:   Not sure  Requested medication (s) are on the active medication list:   No  Future visit scheduled:   Yes   Last ordered: 12/28/2019 #180, ? Refills   Returned because this medication is not on his med list.     Requested Prescriptions  Pending Prescriptions Disp Refills   famotidine (PEPCID) 20 MG tablet [Pharmacy Med Name: FAMOTIDINE 20 MG TAB] 180 tablet     Sig: TAKE (1) TABLET BY MOUTH TWICE DAILY      Gastroenterology:  H2 Antagonists Passed - 06/29/2020  8:24 AM      Passed - Valid encounter within last 12 months    Recent Outpatient Visits           1 month ago Routine general medical examination at a health care facility   [facility], [place] P, DO   3 months ago Upper respiratory tract infection, unspecified type   [facility], Megan P, DO   6 months ago Cough   [place], [place], DO   8 months ago Essential hypertension   [place], [place], DO   8 months ago Periumbilical abdominal pain   [place], [place], DO       Future Appointments             In 4 months Wynetta Emery, Barb Merino, DO [employer]

## 2020-06-30 ENCOUNTER — Other Ambulatory Visit: Payer: Self-pay

## 2020-06-30 ENCOUNTER — Telehealth (INDEPENDENT_AMBULATORY_CARE_PROVIDER_SITE_OTHER): Payer: 59 | Admitting: Psychiatry

## 2020-06-30 ENCOUNTER — Encounter: Payer: Self-pay | Admitting: Psychiatry

## 2020-06-30 DIAGNOSIS — F17200 Nicotine dependence, unspecified, uncomplicated: Secondary | ICD-10-CM | POA: Diagnosis not present

## 2020-06-30 DIAGNOSIS — F411 Generalized anxiety disorder: Secondary | ICD-10-CM | POA: Diagnosis not present

## 2020-06-30 DIAGNOSIS — F3172 Bipolar disorder, in full remission, most recent episode hypomanic: Secondary | ICD-10-CM | POA: Diagnosis not present

## 2020-06-30 MED ORDER — BUSPIRONE HCL 10 MG PO TABS
10.0000 mg | ORAL_TABLET | Freq: Three times a day (TID) | ORAL | 2 refills | Status: DC
Start: 2020-06-30 — End: 2020-08-26

## 2020-06-30 NOTE — Progress Notes (Signed)
Virtual Visit via Video [name]  I connected with Bernard [name] on 06/30/20 at  1:20 PM EDT by a video enabled telemedicine application and verified that I am speaking with the correct person using two identifiers.  Location Provider Location : [facility] Patient Location : Home  Participants: Patient , Provider    I discussed the limitations of evaluation and management by telemedicine and the availability of in person appointments. The patient expressed understanding and agreed to proceed.   I discussed the assessment and treatment plan with the patient. The patient was provided an opportunity to ask questions and all were answered. The patient agreed with the plan and demonstrated an understanding of the instructions.   The patient was advised to call back or seek an in-person evaluation if the symptoms worsen or if the condition fails to improve as anticipated.   Bernard [name]  06/30/2020 2:30 PM Bernard [name]  MRN:  [id number]  Chief Complaint:  Chief Complaint    Follow-up; Anxiety     HPI: Bernard [name] is a 45 year old Caucasian male, employed, lives in [place], has a history of bipolar disorder, GAD, tobacco use disorder was evaluated by telemedicine today.  Patient today reports he is currently doing better.  He reports he was able to closing on his house last Wednesday and looks forward to moving into his new home soon.  Patient reports work is going well.  Patient does report he has been making use of mindfulness, relaxation techniques for managing his anxiety.  Patient reports sleep is overall okay.  Patient reports he continues to follow-up with her therapist however that is going to be on a temporary basis and currently he is trying to find a permanent therapist.  He has gotten in touch with his health insurance plan.  Patient reports his son may have autism spectrum disorder and he wonders whether he also has similar symptoms.  Patient  reports he will let writer know why he thinks he has autism, he will email me a list of symptoms he has observed in himself.  Patient wonders if he can be referred for autism spectrum diagnostic testing.  Patient denies any suicidality, homicidality or perceptual disturbances.  Patient denies any other concerns today.  Visit Diagnosis:    ICD-10-CM   1. Bipolar disorder, in full remission, most recent episode hypomanic ([place])  F31.72   2. GAD (generalized anxiety disorder)  F41.1 busPIRone (BUSPAR) 10 MG tablet  3. Tobacco use disorder  F17.200     Past Psychiatric History: I have reviewed past psychiatric history from progress [name] on 02/18/2018  Past Medical History:  Past Medical History:  Diagnosis Date  . Anxiety   . Depression   . Esophageal stricture   . Hyperlipidemia   . Hypertension   . Weight loss     Past Surgical History:  Procedure Laterality Date  . PILONIDAL CYST EXCISION    . WISDOM TOOTH EXTRACTION      Family Psychiatric History: I have reviewed family psychiatric history from progress [name] on 02/18/2018  Family History:  Family History  Problem Relation Age of Onset  . Cancer Mother   . Anxiety disorder Mother   . Depression Mother   . Cancer Father   . Anxiety disorder Brother   . Depression Brother     Social History: I have reviewed social history from progress [name] on 02/18/2018 Social History   Socioeconomic History  . Marital status: Married    Spouse  name: joan   . Number of children: 2  . Years of education: Not on file  . Highest education level: Bachelor's degree (e.g., BA, AB, BS)  Occupational History  . Not on file  Tobacco Use  . Smoking status: Current Every Day Smoker    Packs/day: 0.25    Types: Cigarettes    Start date: 01/22/2017  . Smokeless tobacco: Never Used  . Tobacco comment: started back again, 1/3 pack per day- reported 11/17/2019  Vaping Use  . Vaping Use: Never used  Substance and Sexual Activity  . Alcohol  use: Yes    Alcohol/week: 2.0 - 6.0 standard drinks    Types: 2 - 6 Cans of beer per week    Comment: on occasion  . Drug use: No  . Sexual activity: Yes    Birth control/protection: None  Other Topics Concern  . Not on file  Social History Narrative  . Not on file   Social Determinants of Health   Financial Resource Strain: Not on file  Food Insecurity: Not on file  Transportation Needs: Not on file  Physical Activity: Not on file  Stress: Not on file  Social Connections: Not on file    Allergies: No Known Allergies  Metabolic Disorder Labs: Lab Results  Component Value Date   HGBA1C 7.3 (H) 05/03/2020   No results found for: PROLACTIN Lab Results  Component Value Date   CHOL 133 05/03/2020   TRIG 59 05/03/2020   HDL 55 05/03/2020   CHOLHDL 3.3 04/17/2018   VLDL 13 08/06/2016   LDLCALC 65 05/03/2020   LDLCALC 59 11/02/2019   Lab Results  Component Value Date   TSH 2.000 05/03/2020   TSH 1.950 04/20/2019    Therapeutic Level Labs: No results found for: LITHIUM No results found for: VALPROATE No components found for:  CBMZ  Current Medications: Current Outpatient Medications  Medication Sig Dispense Refill  . Insulin Infusion Pump Supplies (AUTOSOFT XC INFUSION SET) MISC T slim infusion sets.  Change infusion set every 2-3 days    . Insulin Infusion Pump Supplies (T:SLIM X2 3ML CARTRIDGE) MISC T slim cartridges:  Change cartridge every 2-3 days    . albuterol (VENTOLIN HFA) 108 (90 Base) MCG/ACT inhaler Inhale 2 puffs into the lungs every 6 (six) hours as needed for wheezing or shortness of breath. 8 g 6  . atorvastatin (LIPITOR) 40 MG tablet Take 1 tablet (40 mg total) by mouth daily. 90 tablet 1  . busPIRone (BUSPAR) 10 MG tablet Take 1 tablet (10 mg total) by mouth 3 (three) times daily. 90 tablet 2  . Cetirizine HCl 10 MG CAPS Take by mouth.    . clonazePAM (KLONOPIN) 0.5 MG tablet Take 1 tablet (0.5 mg total) by mouth daily as needed for anxiety. Take  only for severe anxiety attacks (Patient not taking: No sig reported) 12 tablet 1  . Continuous Blood Gluc Sensor (DEXCOM G6 SENSOR) MISC SMARTSIG:1 Each Topical Every 10 Days    . Continuous Blood Gluc Transmit (DEXCOM G6 TRANSMITTER) MISC USE 1 EACH EVERY 3 (THREE) MONTHS    . CONTOUR NEXT TEST test strip     . famotidine (PEPCID) 20 MG tablet TAKE (1) TABLET BY MOUTH TWICE DAILY 180 tablet 1  . insulin glargine (LANTUS) 100 UNIT/ML Solostar Pen 50 units daily when off pump    . Insulin Pen Needle 29G X 12.7MM MISC 1 each by Does not apply route 3 (three) times daily. 100 each 12  .  lamoTRIgine (LAMICTAL) 25 MG tablet TAKE (3) TABLETS BY MOUTH EVERY DAY. 90 tablet 2  . Lancets (ONETOUCH DELICA PLUS [id number]) MISC     . lisinopril (ZESTRIL) 10 MG tablet Take 1 tablet (10 mg total) by mouth daily. 90 tablet 1  . NOVOLOG 100 UNIT/ML injection Inject into the skin.    [name] ondansetron (ZOFRAN ODT) 4 MG disintegrating tablet Take 1 tablet (4 mg total) by mouth every 8 (eight) hours as needed for nausea or vomiting. 90 tablet 1  . pantoprazole (PROTONIX) 40 MG tablet Take 1 tablet (40 mg total) by mouth daily. 90 tablet 1  . ranitidine (ZANTAC) 150 MG capsule Take by mouth daily.     [name] rOPINIRole (REQUIP) 0.5 MG tablet Take 1 tablet (0.5 mg total) by mouth at bedtime. 90 tablet 1  . simethicone (GAS-X) 80 MG chewable tablet Chew 1 tablet (80 mg total) by mouth every 6 (six) hours as needed for flatulence (or cramping). 90 tablet 3   No current facility-administered medications for this visit.     Musculoskeletal: Strength & Muscle Tone: UTA Gait & Station: UTA Patient leans: N/A  Psychiatric Specialty Exam: Review of Systems  Psychiatric/Behavioral: The patient is nervous/anxious.   All other systems reviewed and are negative.   There were no vitals taken for this visit.There is no height or weight on file to calculate BMI.  General Appearance: Casual  Eye Contact:  Good  Speech:  Clear  and Coherent  Volume:  Normal  Mood:  Anxious Coping well  Affect:  Congruent  Thought Process:  Goal Directed and Descriptions of Associations: Intact  Orientation:  Full (Time, Place, and Person)  Thought Content: Logical   Suicidal Thoughts:  No  Homicidal Thoughts:  No  Memory:  Immediate;   Fair Recent;   Fair Remote;   Fair  Judgement:  Fair  Insight:  Fair  Psychomotor Activity:  Normal  Concentration:  Concentration: Fair and Attention Span: Fair  Recall:  [employer] of Knowledge: Fair  Language: Fair  Akathisia:  No  Handed:  Right  AIMS (if indicated): UTA  Assets:  Communication Skills Desire for Improvement Housing Social Support  ADL's:  Intact  Cognition: WNL  Sleep:  Fair   Screenings: GAD-7   Flowsheet Row Video Visit from 05/09/2020 in [facility]  Total GAD-7 Score 4    PHQ2-9   [place] Video Visit from 06/30/2020 in [facility] Video Visit from 05/09/2020 in [facility] Visit from 05/03/2020 in [facility] Visit from 12/08/2019 in [facility] Visit from 10/05/2019 in [facility]  PHQ-2 Total Score 0 1 0 0 0  PHQ-9 Total Score -- -- 3 0 3    Flowsheet Row Video Visit from 05/09/2020 in [facility] No Risk       Assessment and Plan: Blessed Cotham is a 45 year old male, employed, married, lives in [place], has a history of bipolar disorder, anxiety disorder, diabetes, hypertension, hyperlipidemia was evaluated by telemedicine today.  Patient today reports mood symptoms are stable however reports he possibly would like to be referred for autism spectrum testing if possible.  He will Biochemist, clinical know a list of symptoms he has observed in himself.  Plan as noted below.  Plan Bipolar disorder in remission Lamictal 75 mg p.o. daily-reduced  dosage  GAD-stable BuSpar 10 mg p.o. 3 times daily Continue CBT I  have communicated with Mr.Terry Eulas [facility] who has agreed to accept this patient. I have spent communication to staff to schedule.  Tobacco use disorder-improving Provided counseling Patient reports he is about to start smoking cessation class with his health insurance plan.  Rule out autism spectrum disorder-patient will email a list of concerns.  We will consider referring patient for neuropsychological testing.  Follow-up in clinic in 2 months or sooner if needed.  This [name] was generated in part or whole with voice recognition software. Voice recognition is usually quite accurate but there are transcription errors that can and very often do occur. I apologize for any typographical errors that were not detected and corrected.        Ursula Alert, MD 06/30/2020, 2:30 PM

## 2020-07-11 ENCOUNTER — Ambulatory Visit (HOSPITAL_COMMUNITY): Payer: 59 | Admitting: Clinical

## 2020-07-11 ENCOUNTER — Other Ambulatory Visit: Payer: Self-pay

## 2020-07-26 ENCOUNTER — Ambulatory Visit (INDEPENDENT_AMBULATORY_CARE_PROVIDER_SITE_OTHER): Payer: 59 | Admitting: Clinical

## 2020-07-26 ENCOUNTER — Other Ambulatory Visit: Payer: Self-pay

## 2020-07-26 DIAGNOSIS — F3172 Bipolar disorder, in full remission, most recent episode hypomanic: Secondary | ICD-10-CM

## 2020-07-26 DIAGNOSIS — F411 Generalized anxiety disorder: Secondary | ICD-10-CM
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
## 2020-08-09 ENCOUNTER — Other Ambulatory Visit: Payer: Self-pay | Admitting: Family Medicine

## 2020-08-09 DIAGNOSIS — E7800 Pure hypercholesterolemia, unspecified: Secondary | ICD-10-CM

## 2020-08-19 ENCOUNTER — Other Ambulatory Visit: Payer: Self-pay

## 2020-08-19 ENCOUNTER — Ambulatory Visit (INDEPENDENT_AMBULATORY_CARE_PROVIDER_SITE_OTHER): Payer: 59 | Admitting: Clinical

## 2020-08-19 DIAGNOSIS — F3172 Bipolar disorder, in full remission, most recent episode hypomanic: Secondary | ICD-10-CM

## 2020-08-19 DIAGNOSIS — F411 Generalized anxiety disorder: Secondary | ICD-10-CM

## 2020-08-19 NOTE — Progress Notes (Signed)
Virtual Visit via Video Note  I connected with Bernard [name] on 08/19/20 at 11:00 AM EDT by a video enabled telemedicine application and verified that I am speaking with the correct person using two identifiers.  Location: Patient: Home Provider: Office   I discussed the limitations of evaluation and management by telemedicine and the availability of in person appointments. The patient expressed understanding and agreed to proceed.  THERAPIST PROGRESS NOTE   Session Time: 11:00AM-11:45AM   Participation Level: Active   Behavioral Response: CasualAlertAnxious   Type of Therapy: Individual Therapy   Treatment Goals addressed: Coping   Interventions: CBT and Solution Focused   Summary: Bernard [name] is a 45 y.o. male who presents with  Bipolar Disorder and GAD. The OPT therapist worked with the patient for his OPT treatment. The OPT therapist utilized Motivational Interviewing to assist in creating therapeutic repore. The patient in the session was engaged and work in collaboration giving feedback about his triggers and symptoms including the family staying in a extended stay while waiting to finalize on the home the family will be moving into, however, noted  the expectation is that within the next week the house will be finalized.. The OPT therapist utilized Cognitive Behavioral Therapy through cognitive restructuring as well as worked with the patient in review of behaviors and interactions over the past few weeks. The OPT therapist for holistic care reviewed the patients Medication Therapy. The patient as well as caregiver indicated ongoing improved interaction at home.   Suicidal/Homicidal: Nowithout intent/plan   Therapist Response: The OPT therapist worked with the patient for the patients scheduled session. The patient was engaged in his session and gave feedback in relation to triggers, symptoms, and behavior responses over the past few weeks. The OPT therapist worked  with the patient utilizing an in session Cognitive Behavioral Therapy exercise. The patient is aware of his situation and potential impact his current living situation is having on his interactions and work life. The patient has been consistent with his medication therapy and noted this is helpful and reviewed he his an upcoming appointment with Dr Shea Evans on July 21. The OPT therapist will continue treatment work with the patient in his next scheduled session   Plan: Return again in 2/3 weeks.   Diagnosis:      Axis I: GAD and Bipolar Disorder                           Axis II: No diagnosis   I discussed the assessment and treatment plan with the patient. The patient was provided an opportunity to ask questions and all were answered. The patient agreed with the plan and demonstrated an understanding of the instructions.   The patient was advised to call back or seek an in-person evaluation if the symptoms worsen or if the condition fails to improve as anticipated.   I provided 45 minutes of non-face-to-face time during this encounter.   Lennox Grumbles, LCSW   08/19/2020

## 2020-08-25 ENCOUNTER — Telehealth (INDEPENDENT_AMBULATORY_CARE_PROVIDER_SITE_OTHER): Payer: 59 | Admitting: Psychiatry

## 2020-08-25 ENCOUNTER — Other Ambulatory Visit: Payer: Self-pay

## 2020-08-25 DIAGNOSIS — F3172 Bipolar disorder, in full remission, most recent episode hypomanic: Secondary | ICD-10-CM

## 2020-08-25 NOTE — Progress Notes (Signed)
When writer did not see patient connected by video at the time of appointment contacted patient by phone.  Patient picked up phone and said that he has called the office multiple times and left messages to cancel this appointment the past 1 week.

## 2020-08-26 ENCOUNTER — Other Ambulatory Visit: Payer: Self-pay | Admitting: Psychiatry

## 2020-08-26 DIAGNOSIS — F411 Generalized anxiety disorder: Secondary | ICD-10-CM

## 2020-08-29 ENCOUNTER — Other Ambulatory Visit: Payer: Self-pay | Admitting: Family Medicine

## 2020-09-14 ENCOUNTER — Other Ambulatory Visit: Payer: Self-pay | Admitting: Psychiatry

## 2020-09-14 DIAGNOSIS — F3172 Bipolar disorder, in full remission, most recent episode hypomanic: Secondary | ICD-10-CM

## 2020-09-16 ENCOUNTER — Ambulatory Visit (INDEPENDENT_AMBULATORY_CARE_PROVIDER_SITE_OTHER): Payer: 59 | Admitting: Clinical

## 2020-09-16 ENCOUNTER — Other Ambulatory Visit: Payer: Self-pay

## 2020-09-16 DIAGNOSIS — F3172 Bipolar disorder, in full remission, most recent episode hypomanic: Secondary | ICD-10-CM | POA: Diagnosis not present

## 2020-09-16 DIAGNOSIS — F411 Generalized anxiety disorder: Secondary | ICD-10-CM

## 2020-09-16 NOTE — Progress Notes (Signed)
Virtual Visit via Telephone Note  I connected with Bernard [name] on 09/16/20 at 11:00 AM EDT by telephone and verified that I am speaking with the correct person using two identifiers.  Location: Patient: Office Provider: Home   I discussed the limitations, risks, security and privacy concerns of performing an evaluation and management service by telephone and the availability of in person appointments. I also discussed with the patient that there may be a patient responsible charge related to this service. The patient expressed understanding and agreed to proceed.   THERAPIST PROGRESS NOTE   Session Time: 11:00AM-11:30AM   Participation Level: Active   Behavioral Response: CasualAlertAnxious   Type of Therapy: Individual Therapy   Treatment Goals addressed: Coping   Interventions: CBT and Solution Focused   Summary: Bernard [name] is a 45 y.o. male who presents with  Bipolar Disorder and GAD. The OPT therapist worked with the patient for his OPT treatment. The OPT therapist utilized Motivational Interviewing to assist in creating therapeutic repore. The patient in the session was engaged and work in collaboration giving feedback about his triggers and symptoms . The patient in this session spoke about make implementing changes from his last session and this being a large identifiable positive difference by focusing on things that he has some control in and not fixating on things that he cannot control. The OPT therapist utilized Cognitive Behavioral Therapy through cognitive restructuring as well as worked with the patient in review of behaviors and interactions over the past few weeks. The OPT therapist for holistic care reviewed the patients Medication Therapy. The patient as well as caregiver indicated ongoing improved interaction at home.   Suicidal/Homicidal: Nowithout intent/plan   Therapist Response: The OPT therapist worked with the patient for the patients  scheduled session. The patient was engaged in his session and gave feedback in relation to triggers, symptoms, and behavior responses over the past few weeks. The OPT therapist worked with the patient utilizing an in session Cognitive Behavioral Therapy exercise. The patient spoke about by changing his perspective and arranging his priorities this being a help and healthier approach. The patient has been consistent with his medication therapy and noted this is helpful. The OPT therapist will continue treatment work with the patient in his next scheduled session   Plan: Return again in 2/3 weeks.   Diagnosis:      Axis I: GAD and Bipolar Disorder                           Axis II: No diagnosis   I discussed the assessment and treatment plan with the patient. The patient was provided an opportunity to ask questions and all were answered. The patient agreed with the plan and demonstrated an understanding of the instructions.   The patient was advised to call back or seek an in-person evaluation if the symptoms worsen or if the condition fails to improve as anticipated.   I provided 30 minutes of non-face-to-face time during this encounter.   Lennox Grumbles, LCSW   09/16/2020

## 2020-10-07 ENCOUNTER — Telehealth: Payer: Self-pay

## 2020-10-07 NOTE — Telephone Encounter (Signed)
Medication management - Referral sent to [place] to Dr. Sima Matas to request diagnostic evaluation for adult autism.

## 2020-10-14 ENCOUNTER — Ambulatory Visit (INDEPENDENT_AMBULATORY_CARE_PROVIDER_SITE_OTHER): Payer: 59 | Admitting: Clinical

## 2020-10-14 ENCOUNTER — Other Ambulatory Visit: Payer: Self-pay

## 2020-10-14 DIAGNOSIS — F3172 Bipolar disorder, in full remission, most recent episode hypomanic: Secondary | ICD-10-CM | POA: Diagnosis not present

## 2020-10-14 DIAGNOSIS — F411 Generalized anxiety disorder: Secondary | ICD-10-CM | POA: Diagnosis not present

## 2020-10-14 NOTE — Progress Notes (Signed)
Virtual Visit via Video Note  I connected with Bernard [name] on 10/14/20 at 11:00 AM EDT by a video enabled telemedicine application and verified that I am speaking with the correct person using two identifiers.  Location: Patient: Home Provider: Office   I discussed the limitations of evaluation and management by telemedicine and the availability of in person appointments. The patient expressed understanding and agreed to proceed.  THERAPIST PROGRESS NOTE   Session Time: 11:00AM-11:40AM   Participation Level: Active   Behavioral Response: CasualAlertAnxious   Type of Therapy: Individual Therapy   Treatment Goals addressed: Coping   Interventions: CBT and Solution Focused   Summary: Bernard [name] is a 45 y.o. male who presents with  Bipolar Disorder and GAD. The OPT therapist worked with the patient for his OPT treatment. The OPT therapist utilized Motivational Interviewing to assist in creating therapeutic repore. The patient in the session was engaged and work in collaboration giving feedback about his triggers and symptoms . The patient in this session spoke about being in a new home. The OPT therapist utilized Cognitive Behavioral Therapy through cognitive restructuring as well as worked with the patient in review of behaviors and interactions over the past few weeks. The OPT therapist worked with the patient about continuing to work on focusing his energy in areas he can have control.The OPT therapist for holistic care reviewed the patients Medication Therapy. The patient as well as caregiver indicated ongoing improved interaction at home.   Suicidal/Homicidal: Nowithout intent/plan   Therapist Response: The OPT therapist worked with the patient for the patients scheduled session. The patient was engaged in his session and gave feedback in relation to triggers, symptoms, and behavior responses over the past few weeks. The OPT therapist worked with the patient utilizing  an in session Cognitive Behavioral Therapy exercise. The patient spoke about by changing his perspective and arranging his priorities this being a help and healthier approach. The patient noted, " since leaving extended stay and moving into my house things have been much more manageable instead of being at 100 with stress in the background now its much more like blips and much more manageable". The patient has been consistent with his medication therapy and noted this is helpful. The patient spoke about working to enjoy his time and having more purpose with his planning. The OPT therapist will continue treatment work with the patient in his next scheduled session   Plan: Return again in 2/3 weeks.   Diagnosis:      Axis I: GAD and Bipolar Disorder                           Axis II: No diagnosis   I discussed the assessment and treatment plan with the patient. The patient was provided an opportunity to ask questions and all were answered. The patient agreed with the plan and demonstrated an understanding of the instructions.   The patient was advised to call back or seek an in-person evaluation if the symptoms worsen or if the condition fails to improve as anticipated.   I provided 40 minutes of non-face-to-face time during this encounter.   Lennox Grumbles, LCSW   10/14/2020

## 2020-10-24 ENCOUNTER — Other Ambulatory Visit: Payer: Self-pay

## 2020-10-24 ENCOUNTER — Encounter: Payer: Self-pay | Admitting: Psychiatry

## 2020-10-24 ENCOUNTER — Telehealth (INDEPENDENT_AMBULATORY_CARE_PROVIDER_SITE_OTHER): Payer: 59 | Admitting: Psychiatry

## 2020-10-24 DIAGNOSIS — F17200 Nicotine dependence, unspecified, uncomplicated: Secondary | ICD-10-CM | POA: Diagnosis not present

## 2020-10-24 DIAGNOSIS — F411 Generalized anxiety disorder: Secondary | ICD-10-CM | POA: Diagnosis not present

## 2020-10-24 DIAGNOSIS — F3172 Bipolar disorder, in full remission, most recent episode hypomanic: Secondary | ICD-10-CM

## 2020-10-24 MED ORDER — BUSPIRONE HCL 10 MG PO TABS: ORAL_TABLET | ORAL | 2 refills | Status: DC

## 2020-10-24 MED ORDER — VARENICLINE TARTRATE 1 MG PO TABS: 1.0000 mg | ORAL_TABLET | Freq: Two times a day (BID) | ORAL | 3 refills | Status: DC

## 2020-10-24 NOTE — Progress Notes (Signed)
Virtual Visit via Video Note  I connected with Bernard [name] on 10/24/20 at  9:00 AM EDT by a video enabled telemedicine application and verified that I am speaking with the correct person using two identifiers.  Location Provider Location : [facility] Patient Location : Home  Participants: Patient , Provider   I discussed the limitations of evaluation and management by telemedicine and the availability of in person appointments. The patient expressed understanding and agreed to proceed.    I discussed the assessment and treatment plan with the patient. The patient was provided an opportunity to ask questions and all were answered. The patient agreed with the plan and demonstrated an understanding of the instructions.   The patient was advised to call back or seek an in-person evaluation if the symptoms worsen or if the condition fails to improve as anticipated.   [place] MD OP Progress Note  10/24/2020 10:42 AM Bernard [name]  MRN:  [id number]  Chief Complaint:  Chief Complaint   Follow-up; Depression; Anxiety    HPI: Bernard [name] is a 45 year old Caucasian male, employed, lives in [place], has a history of bipolar disorder, GAD, tobacco use disorder was evaluated by telemedicine today.  Patient today reports he is currently doing well with regards to his mood.  He was able to move into his new home.  He is currently settling in.  He reports work as going well.  Patient reports he is able to manage work-related stressors better than before.  He does have mood swings however they are tolerable.  Patient reports lamotrigine is beneficial.  He is also on the BuSpar.  Patient reports sleep as good.  Patient continues to struggle with smoking, reports he completed smoking cessation class with [employer].  That worked for a short period of time.  Patient denies any suicidality, homicidality or perceptual disturbances.  Patient is currently in psychotherapy session with  Bernard [name] which is going well.  Patient denies any other concerns today.  We will  Visit Diagnosis:    ICD-10-CM   1. Bipolar disorder, in full remission, most recent episode hypomanic ([place])  F31.72     2. GAD (generalized anxiety disorder)  F41.1 busPIRone (BUSPAR) 10 MG tablet    3. Tobacco use disorder  F17.200 varenicline (CHANTIX) 1 MG tablet      Past Psychiatric History: Reviewed past psychiatric history from progress note on 02/18/2018  Past Medical History:  Past Medical History:  Diagnosis Date   Anxiety    Depression    Esophageal stricture    Hyperlipidemia    Hypertension    Weight loss     Past Surgical History:  Procedure Laterality Date   PILONIDAL CYST EXCISION     WISDOM TOOTH EXTRACTION      Family Psychiatric History: Reviewed family psychiatric history from progress note on 02/18/2018  Family History:  Family History  Problem Relation Age of Onset   Cancer Mother    Anxiety disorder Mother    Depression Mother    Cancer Father    Anxiety disorder Brother    Depression Brother     Social History: Reviewed social history from progress note on 02/18/2018 Social History   Socioeconomic History   Marital status: Married    Spouse name: joan    Number of children: 2   Years of education: Not on file   Highest education level: Bachelor's degree (e.g., BA, AB, BS)  Occupational History   Not on file  Tobacco Use  Smoking status: Every Day    Packs/day: 0.25    Types: Cigarettes    Start date: 01/22/2017   Smokeless tobacco: Never   Tobacco comments:    started back again, 1/3 pack per day- reported 11/17/2019  Vaping Use   Vaping Use: Never used  Substance and Sexual Activity   Alcohol use: Yes    Alcohol/week: 2.0 - 6.0 standard drinks    Types: 2 - 6 Cans of beer per week    Comment: on occasion   Drug use: No   Sexual activity: Yes    Birth control/protection: None  Other Topics Concern   Not on file  Social History  Narrative   Not on file   Social Determinants of Health   Financial Resource Strain: Not on file  Food Insecurity: Not on file  Transportation Needs: Not on file  Physical Activity: Not on file  Stress: Not on file  Social Connections: Not on file    Allergies: No Known Allergies  Metabolic Disorder Labs: Lab Results  Component Value Date   HGBA1C 7.3 (H) 05/03/2020   No results found for: PROLACTIN Lab Results  Component Value Date   CHOL 133 05/03/2020   TRIG 59 05/03/2020   HDL 55 05/03/2020   CHOLHDL 3.3 04/17/2018   VLDL 13 08/06/2016   LDLCALC 65 05/03/2020   LDLCALC 59 11/02/2019   Lab Results  Component Value Date   TSH 2.000 05/03/2020   TSH 1.950 04/20/2019    Therapeutic Level Labs: No results found for: LITHIUM No results found for: VALPROATE No components found for:  CBMZ  Current Medications: Current Outpatient Medications  Medication Sig Dispense Refill   varenicline (CHANTIX) 1 MG tablet Take 1 tablet (1 mg total) by mouth 2 (two) times daily. 60 tablet 3   albuterol (VENTOLIN HFA) 108 (90 Base) MCG/ACT inhaler Inhale 2 puffs into the lungs every 6 (six) hours as needed for wheezing or shortness of breath. 8 g 6   atorvastatin (LIPITOR) 40 MG tablet Take 1 tablet (40 mg total) by mouth daily. 90 tablet 1   busPIRone (BUSPAR) 10 MG tablet TAKE (1) TABLET BY MOUTH THREE TIMES A DAY 90 tablet 2   Cetirizine HCl 10 MG CAPS Take by mouth.     clonazePAM (KLONOPIN) 0.5 MG tablet Take 1 tablet (0.5 mg total) by mouth daily as needed for anxiety. Take only for severe anxiety attacks (Patient not taking: No sig reported) 12 tablet 1   Continuous Blood Gluc Sensor (DEXCOM G6 SENSOR) MISC SMARTSIG:1 Each Topical Every 10 Days     Continuous Blood Gluc Transmit (DEXCOM G6 TRANSMITTER) MISC USE 1 EACH EVERY 3 (THREE) MONTHS     CONTOUR NEXT TEST test strip      famotidine (PEPCID) 20 MG tablet TAKE (1) TABLET BY MOUTH TWICE DAILY 180 tablet 1   insulin  glargine (LANTUS) 100 UNIT/ML Solostar Pen 50 units daily when off pump     Insulin Infusion Pump Supplies (AUTOSOFT XC INFUSION SET) MISC T slim infusion sets.  Change infusion set every 2-3 days     Insulin Infusion Pump Supplies (T:SLIM X2 3ML CARTRIDGE) MISC T slim cartridges:  Change cartridge every 2-3 days     Insulin Pen Needle 29G X 12.7MM MISC 1 each by Does not apply route 3 (three) times daily. 100 each 12   lamoTRIgine (LAMICTAL) 25 MG tablet TAKE (3) TABLETS BY MOUTH EVERY DAY. 90 tablet 2   Lancets (ONETOUCH DELICA PLUS LOVFIE33I)  MISC      lisinopril (ZESTRIL) 10 MG tablet Take 1 tablet (10 mg total) by mouth daily. 90 tablet 1   NOVOLOG 100 UNIT/ML injection Inject into the skin.     ondansetron (ZOFRAN ODT) 4 MG disintegrating tablet Take 1 tablet (4 mg total) by mouth every 8 (eight) hours as needed for nausea or vomiting. 90 tablet 1   pantoprazole (PROTONIX) 40 MG tablet TAKE (1) TABLET BY MOUTH EVERY DAY 90 tablet 1   ranitidine (ZANTAC) 150 MG capsule Take by mouth daily.      rOPINIRole (REQUIP) 0.5 MG tablet Take 1 tablet (0.5 mg total) by mouth at bedtime. 90 tablet 1   simethicone (GAS-X) 80 MG chewable tablet Chew 1 tablet (80 mg total) by mouth every 6 (six) hours as needed for flatulence (or cramping). 90 tablet 3   No current facility-administered medications for this visit.     Musculoskeletal: Strength & Muscle Tone: within normal limits Gait & Station: normal Patient leans: N/A  Psychiatric Specialty Exam: Review of Systems  Psychiatric/Behavioral:         Mood swings- stable  All other systems reviewed and are negative.  There were no vitals taken for this visit.There is no height or weight on file to calculate BMI.  General Appearance: Casual  Eye Contact:  Fair  Speech:  Clear and Coherent  Volume:  Normal  Mood:   mood swings - stable  Affect:  Appropriate  Thought Process:  Goal Directed and Descriptions of Associations: Intact  Orientation:   Full (Time, Place, and Person)  Thought Content: Logical   Suicidal Thoughts:  No  Homicidal Thoughts:  No  Memory:  Immediate;   Fair Recent;   Fair Remote;   Fair  Judgement:  Fair  Insight:  Good  Psychomotor Activity:  Normal  Concentration:  Concentration: Fair and Attention Span: Fair  Recall:  Hazard of Knowledge: Good  Language: Fair  Akathisia:  No  Handed:  Right  AIMS (if indicated): not done  Assets:  Communication Skills Desire for [place] Talents/Skills Transportation Vocational/Educational  ADL's:  Intact  Cognition: WNL  Sleep:  Fair   Screenings: GAD-7    Health and safety inspector from 07/26/2020 in [facility] Video Visit from 05/09/2020 in [facility]  Total GAD-7 Score 4 4      PHQ2-9    Flowsheet Row Video Visit from 06/30/2020 in [facility] Video Visit from 05/09/2020 in [facility] Visit from 05/03/2020 in [facility] Visit from 12/08/2019 in [facility] Visit from 10/05/2019 in [facility]  PHQ-2 Total Score 0 1 0 0 0  PHQ-9 Total Score -- -- 3 0 3      Flowsheet Row Counselor from 07/26/2020 in [facility]- Video Visit from 05/09/2020 in [facility] No Risk No Risk        Assessment and Plan: Bernard [name] is a 45 year old male, employed, married, lives in [place], has a history of bipolar disorder, anxiety disorder, diabetes, hypertension, hyperlipidemia was evaluated by telemedicine today.  Patient with bipolar symptoms as well as anxiety currently reports mood symptoms are stable however struggles with smoking and is interested in restarting Chantix.  Plan Bipolar disorder in remission Lamotrigine 75 mg p.o.  daily-reduced dosage  GAD-stable BuSpar 10 mg p.o. 3 times daily Continue CBT with  Bernard [name]  Tobacco use disorder-unstable Patient completed smoking cessation class with [facility] Chantix at a higher dosage, 1 mg p.o. twice daily Provided counseling for 6 minutes.  Rule out autism spectrum disorder-patient reports he will email a list of concerns.  Patient was referred for neuropsychological testing-pending  Follow-up in clinic in 6 weeks or sooner if needed.  This note was generated in part or whole with voice recognition software. Voice recognition is usually quite accurate but there are transcription errors that can and very often do occur. I apologize for any typographical errors that were not detected and corrected.     Ursula Alert, MD 10/24/2020, 10:42 AM

## 2020-10-24 NOTE — Patient Instructions (Signed)
Varenicline oral tablets What is this medication? VARENICLINE (var e NI kleen) is used to help people quit smoking. It is used with a patient support program recommended by your physician. This medicine may be used for other purposes; ask your health care provider or pharmacist if you have questions. COMMON BRAND NAME(S): Chantix What should I tell my care team before I take this medication? They need to know if you have any of these conditions: heart disease if you often drink alcohol kidney disease mental illness on hemodialysis seizures history of stroke suicidal thoughts, plans, or attempt; a previous suicide attempt by you or a family member an unusual or allergic reaction to varenicline, other medicines, foods, dyes, or preservatives pregnant or trying to get pregnant breast-feeding How should I use this medication? Take this medicine by mouth after eating. Take with a full glass of water. Follow the directions on the prescription label. Take your doses at regular intervals. Do not take your medicine more often than directed. There are 3 ways you can use this medicine to help you quit smoking; talk to your health care professional to decide which plan is right for you: 1) you can choose a quit date and start this medicine 1 week before the quit date, or, 2) you can start taking this medicine before you choose a quit date, and then pick a quit date between day 8 and 35 days of treatment, or, 3) if you are not sure that you are able or willing to quit smoking right away, start taking this medicine and slowly decrease the amount you smoke as directed by your health care professional with the goal of being cigarette-free by week 12 of treatment. Stick to your plan; ask about support groups or other ways to help you remain cigarette-free. If you are motivated to quit smoking and did not succeed during a previous attempt with this medicine for reasons other than side effects, or if you  returned to smoking after this treatment, speak with your health care professional about whether another course of this medicine may be right for you. A special MedGuide will be given to you by the pharmacist with each prescription and refill. Be sure to read this information carefully each time. Talk to your pediatrician regarding the use of this medicine in children. This medicine is not approved for use in children. Overdosage: If you think you have taken too much of this medicine contact a poison control center or emergency room at once. NOTE: This medicine is only for you. Do not share this medicine with others. What if I miss a dose? If you miss a dose, take it as soon as you can. If it is almost time for your next dose, take only that dose. Do not take double or extra doses. What may interact with this medication? alcohol insulin other medicines used to help people quit smoking theophylline warfarin This list may not describe all possible interactions. Give your health care provider a list of all the medicines, herbs, non-prescription drugs, or dietary supplements you use. Also tell them if you smoke, drink alcohol, or use illegal drugs. Some items may interact with your medicine. What should I watch for while using this medication? It is okay if you do not succeed at your attempt to quit and have a cigarette. You can still continue your quit attempt and keep using this medicine as directed. Just throw away your cigarettes and get back to your quit plan. Talk to your health care  provider before using other treatments to quit smoking. Using this medicine with other treatments to quit smoking may increase the risk for side effects compared to using a treatment alone. You may get drowsy or dizzy. Do not drive, use machinery, or do anything that needs mental alertness until you know how this medicine affects you. Do not stand or sit up quickly, especially if you are an older patient. This reduces  the risk of dizzy or fainting spells. Decrease the number of alcoholic beverages that you drink during treatment with this medicine until you know if this medicine affects your ability to tolerate alcohol. Some people have experienced increased drunkenness (intoxication), unusual or sometimes aggressive behavior, or no memory of things that have happened (amnesia) during treatment with this medicine. Sleepwalking can happen during treatment with this medicine, and can sometimes lead to behavior that is harmful to you, other people, or property. Stop taking this medicine and tell your doctor if you start sleepwalking or have other unusual sleep-related activity. After taking this medicine, you may get up out of bed and do an activity that you do not know you are doing. The next morning, you may have no memory of this. Activities include driving a car ("sleep-driving"), making and eating food, talking on the phone, sexual activity, and sleep-walking. Serious injuries have occurred. Stop the medicine and call your doctor right away if you find out you have done any of these activities. Do not take this medicine if you have used alcohol that evening. Do not take it if you have taken another medicine for sleep. The risk of doing these sleep-related activities is higher. Patients and their families should watch out for new or worsening depression or thoughts of suicide. Also watch out for sudden changes in feelings such as feeling anxious, agitated, panicky, irritable, hostile, aggressive, impulsive, severely restless, overly excited and hyperactive, or not being able to sleep. If this happens, call your health care professional. If you have diabetes and you quit smoking, the effects of insulin may be increased and you may need to reduce your insulin dose. Check with your doctor or health care professional about how you should adjust your insulin dose. What side effects may I notice from receiving this  medication? Side effects that you should report to your doctor or health care professional as soon as possible: allergic reactions like skin rash, itching or hives, swelling of the face, lips, tongue, or throat acting aggressive, being angry or violent, or acting on dangerous impulses breathing problems changes in emotions or moods chest pain or chest tightness feeling faint or lightheaded, falls hallucination, loss of contact with reality mouth sores redness, blistering, peeling or loosening of the skin, including inside the mouth signs and symptoms of a stroke like changes in vision; confusion; trouble speaking or understanding; severe headaches; sudden numbness or weakness of the face, arm or leg; trouble walking; dizziness; loss of balance or coordination seizures sleepwalking suicidal thoughts or other mood changes Side effects that usually do not require medical attention (report to your doctor or health care professional if they continue or are bothersome): constipation gas headache nausea, vomiting strange dreams trouble sleeping This list may not describe all possible side effects. Call your doctor for medical advice about side effects. You may report side effects to FDA at 1-800-FDA-1088. Where should I keep my medication? Keep out of the reach of children. Store at room temperature between 15 and 30 degrees C (59 and 86 degrees F). Throw away any  unused medicine after the expiration date. NOTE: This sheet is a summary. It may not cover all possible information. If you have questions about this medicine, talk to your doctor, pharmacist, or health care provider.  2022 Elsevier/Gold Standard (2018-01-10 14:27:36)

## 2020-10-26 ENCOUNTER — Telehealth: Payer: Self-pay | Admitting: Psychiatry

## 2020-10-26 NOTE — Telephone Encounter (Signed)
Dr. Ria Clock , I was not able to reach your  referral coordinator Janett Billow but I just would like to let you know that Dr.  Darol Destine is no more in our office and he was doing autism spectrum diagnostic testing We are in process of hiring new doctor right now  that will see patients that they need autism spectrum diagnostic testing . Thank you so much for this referral!   Received this message from the referral service , I will have Janett Billow CMA refer patient to teach program for his autism spectrum diagnostic testing.

## 2020-10-26 NOTE — Telephone Encounter (Signed)
We will have Janett Billow CMA refer  this patient

## 2020-10-28 ENCOUNTER — Other Ambulatory Visit: Payer: Self-pay

## 2020-10-28 ENCOUNTER — Encounter: Payer: Self-pay | Admitting: Family Medicine

## 2020-10-28 ENCOUNTER — Ambulatory Visit (INDEPENDENT_AMBULATORY_CARE_PROVIDER_SITE_OTHER): Payer: Managed Care, Other (non HMO) | Admitting: Family Medicine

## 2020-10-28 VITALS — BP 106/66 | HR 76 | Temp 98.0°F | Ht 69.9 in | Wt 191.6 lb

## 2020-10-28 DIAGNOSIS — Z23 Encounter for immunization: Secondary | ICD-10-CM | POA: Diagnosis not present

## 2020-10-28 DIAGNOSIS — F3181 Bipolar II disorder: Secondary | ICD-10-CM

## 2020-10-28 DIAGNOSIS — E7800 Pure hypercholesterolemia, unspecified: Secondary | ICD-10-CM

## 2020-10-28 DIAGNOSIS — E785 Hyperlipidemia, unspecified: Secondary | ICD-10-CM

## 2020-10-28 DIAGNOSIS — F3172 Bipolar disorder, in full remission, most recent episode hypomanic: Secondary | ICD-10-CM

## 2020-10-28 DIAGNOSIS — E10319 Type 1 diabetes mellitus with unspecified diabetic retinopathy without macular edema: Secondary | ICD-10-CM

## 2020-10-28 DIAGNOSIS — F339 Major depressive disorder, recurrent, unspecified: Secondary | ICD-10-CM

## 2020-10-28 DIAGNOSIS — E1069 Type 1 diabetes mellitus with other specified complication: Secondary | ICD-10-CM | POA: Diagnosis not present

## 2020-10-28 DIAGNOSIS — E109 Type 1 diabetes mellitus without complications: Secondary | ICD-10-CM

## 2020-10-28 DIAGNOSIS — Z794 Long term (current) use of insulin: Secondary | ICD-10-CM

## 2020-10-28 DIAGNOSIS — R569 Unspecified convulsions: Secondary | ICD-10-CM

## 2020-10-28 DIAGNOSIS — K219 Gastro-esophageal reflux disease without esophagitis: Secondary | ICD-10-CM

## 2020-10-28 DIAGNOSIS — I10 Essential (primary) hypertension: Secondary | ICD-10-CM

## 2020-10-28 DIAGNOSIS — E11319 Type 2 diabetes mellitus with unspecified diabetic retinopathy without macular edema: Secondary | ICD-10-CM

## 2020-10-28 LAB — BAYER DCA HB A1C WAIVED: HB A1C (BAYER DCA - WAIVED): 6.4 % — ABNORMAL HIGH (ref 4.8–5.6)

## 2020-10-28 MED ORDER — LISINOPRIL 10 MG PO TABS: 10.0000 mg | ORAL_TABLET | Freq: Every day | ORAL | 1 refills | Status: DC

## 2020-10-28 MED ORDER — ROPINIROLE HCL 0.5 MG PO TABS: 0.5000 mg | ORAL_TABLET | Freq: Every day | ORAL | 1 refills | Status: DC

## 2020-10-28 MED ORDER — ATORVASTATIN CALCIUM 40 MG PO TABS: 40.0000 mg | ORAL_TABLET | Freq: Every day | ORAL | 1 refills | Status: DC

## 2020-10-28 MED ORDER — PANTOPRAZOLE SODIUM 40 MG PO TBEC: DELAYED_RELEASE_TABLET | ORAL | 1 refills | Status: DC

## 2020-10-28 NOTE — Progress Notes (Signed)
BP 106/66   Pulse 76   Temp 98 F (36.7 C) (Oral)   Ht 5' 9.9" (1.775 m)   Wt 191 lb 9.6 oz (86.9 kg)   SpO2 98%   BMI 27.57 kg/m    Subjective:    Patient ID: Bernard [name], male    DOB: [birth year], 45 y.o.   MRN: [id number]  HPI: Bernard [name] is a 45 y.o. male  Chief Complaint  Patient presents with   Depression   Diabetes   Gastroesophageal Reflux   DIABETES Hypoglycemic episodes:no Polydipsia/polyuria: no Visual disturbance: yes Chest pain: no Paresthesias: no Glucose Monitoring: yes  Accucheck frequency: TID Taking Insulin?: yes  Long acting insulin:  Short acting insulin: Blood Pressure Monitoring: not checking Retinal Examination: Up to Date Foot Exam: Up to Date Diabetic Education: Completed Pneumovax: Up to Date Influenza: Up to Date Aspirin: no  HYPERTENSION / [place] Satisfied with current treatment? yes Duration of hypertension: chronic BP monitoring frequency: not checking BP medication side effects: no Past BP meds: lisinopril Duration of hyperlipidemia: chronic Cholesterol medication side effects: no Cholesterol supplements: none Past cholesterol medications: atorvastatin Medication compliance: excellent compliance Aspirin: no Recent stressors: no Recurrent headaches: no Visual changes: no Palpitations: no Dyspnea: no Chest pain: no Lower extremity edema: no Dizzy/lightheaded: no  Relevant past medical, surgical, family and social history reviewed and updated as indicated. Interim medical history since our last visit reviewed. Allergies and medications reviewed and updated.  Review of Systems  Constitutional: Negative.   Respiratory: Negative.    Cardiovascular: Negative.   Gastrointestinal: Negative.   Musculoskeletal: Negative.   Neurological: Negative.   Psychiatric/Behavioral: Negative.     Per HPI unless specifically indicated above     Objective:    BP 106/66   Pulse 76   Temp 98 F  (36.7 C) (Oral)   Ht 5' 9.9" (1.775 m)   Wt 191 lb 9.6 oz (86.9 kg)   SpO2 98%   BMI 27.57 kg/m   Wt Readings from Last 3 Encounters:  10/28/20 191 lb 9.6 oz (86.9 kg)  05/03/20 198 lb 6.4 oz (90 kg)  11/02/19 198 lb (89.8 kg)    Physical Exam Vitals and nursing note reviewed.  Constitutional:      General: He is not in acute distress.    Appearance: Normal appearance. He is not ill-appearing, toxic-appearing or diaphoretic.  HENT:     Head: Normocephalic and atraumatic.     Right Ear: External ear normal.     Left Ear: External ear normal.     Nose: Nose normal.     Mouth/Throat:     Mouth: Mucous membranes are moist.     Pharynx: Oropharynx is clear.  Eyes:     General: No scleral icterus.       Right eye: No discharge.        Left eye: No discharge.     Extraocular Movements: Extraocular movements intact.     Conjunctiva/sclera: Conjunctivae normal.     Pupils: Pupils are equal, round, and reactive to light.  Cardiovascular:     Rate and Rhythm: Normal rate and regular rhythm.     Pulses: Normal pulses.     Heart sounds: Normal heart sounds. No murmur heard.   No friction rub. No gallop.  Pulmonary:     Effort: Pulmonary effort is normal. No respiratory distress.     Breath sounds: Normal breath sounds. No stridor. No wheezing, rhonchi or rales.  Chest:  Chest wall: No tenderness.  Musculoskeletal:        General: Normal range of motion.     Cervical back: Normal range of motion and neck supple.  Skin:    General: Skin is warm and dry.     Capillary Refill: Capillary refill takes less than 2 seconds.     Coloration: Skin is not jaundiced or pale.     Findings: No bruising, erythema, lesion or rash.  Neurological:     General: No focal deficit present.     Mental Status: He is alert and oriented to person, place, and time. Mental status is at baseline.  Psychiatric:        Mood and Affect: Mood normal.        Behavior: Behavior normal.        Thought  Content: Thought content normal.        Judgment: Judgment normal.    Results for orders placed or performed in visit on 10/28/20  Bayer DCA Hb A1c Waived  Result Value Ref Range   HB A1C (BAYER DCA - WAIVED) 6.4 (H) 4.8 - 5.6 %  CBC with Differential/Platelet  Result Value Ref Range   WBC 6.8 3.4 - 10.8 x10E3/uL   RBC 5.08 4.14 - 5.80 x10E6/uL   Hemoglobin 15.5 13.0 - 17.7 g/dL   Hematocrit 45.3 37.5 - 51.0 %   MCV 89 79 - 97 fL   MCH 30.5 26.6 - 33.0 pg   MCHC 34.2 31.5 - 35.7 g/dL   RDW 12.2 11.6 - 15.4 %   Platelets 225 150 - 450 x10E3/uL   Neutrophils 62 Not Estab. %   Lymphs 25 Not Estab. %   Monocytes 7 Not Estab. %   Eos 5 Not Estab. %   Basos 1 Not Estab. %   Neutrophils Absolute 4.2 1.4 - 7.0 x10E3/uL   Lymphocytes Absolute 1.7 0.7 - 3.1 x10E3/uL   Monocytes Absolute 0.5 0.1 - 0.9 x10E3/uL   EOS (ABSOLUTE) 0.3 0.0 - 0.4 x10E3/uL   Basophils Absolute 0.1 0.0 - 0.2 x10E3/uL   Immature Granulocytes 0 Not Estab. %   Immature Grans (Abs) 0.0 0.0 - 0.1 x10E3/uL  Lipid Panel w/o Chol/HDL Ratio  Result Value Ref Range   Cholesterol, Total 123 100 - 199 mg/dL   Triglycerides 47 0 - 149 mg/dL   HDL 48 >39 mg/dL   VLDL Cholesterol Cal 11 5 - 40 mg/dL   LDL Chol Calc (NIH) 64 0 - 99 mg/dL  Comprehensive metabolic panel  Result Value Ref Range   Glucose 91 65 - 99 mg/dL   BUN 7 6 - 24 mg/dL   Creatinine, Ser 1.10 0.76 - 1.27 mg/dL   eGFR 85 >59 mL/min/1.73   BUN/Creatinine Ratio 6 (L) 9 - 20   Sodium 143 134 - 144 mmol/L   Potassium 4.8 3.5 - 5.2 mmol/L   Chloride 103 96 - 106 mmol/L   CO2 23 20 - 29 mmol/L   Calcium 9.6 8.7 - 10.2 mg/dL   Total Protein 6.4 6.0 - 8.5 g/dL   Albumin 4.9 4.0 - 5.0 g/dL   Globulin, Total 1.5 1.5 - 4.5 g/dL   Albumin/Globulin Ratio 3.3 (H) 1.2 - 2.2   Bilirubin Total 0.5 0.0 - 1.2 mg/dL   Alkaline Phosphatase 62 44 - 121 IU/L   AST 27 0 - 40 IU/L   ALT 30 0 - 44 IU/L  Lamotrigine level  Result Value Ref Range   Lamotrigine Lvl  WILL  FOLLOW       Assessment & Plan:   Problem List Items Addressed This Visit       Cardiovascular and Mediastinum   Hypertension    Under good control on current regimen. Continue current regimen. Continue to monitor. Call with any concerns. Refills given. Labs drawn today.       Relevant Medications   lisinopril (ZESTRIL) 10 MG tablet   atorvastatin (LIPITOR) 40 MG tablet     Digestive   Gastroesophageal reflux disease without esophagitis    Under good control on current regimen. Continue current regimen. Continue to monitor. Call with any concerns. Refills given. Labs drawn today.      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 Continue diet and exercise. Continue current regimen. Continue to monitor. Call with any concerns.       Relevant Medications   lisinopril (ZESTRIL) 10 MG tablet   atorvastatin (LIPITOR) 40 MG tablet   Hyperlipidemia due to type 1 diabetes mellitus ([place])    Under good control on current regimen. Continue current regimen. Continue to monitor. Call with any concerns. Refills given. Labs drawn today.      Relevant Medications   lisinopril (ZESTRIL) 10 MG tablet   atorvastatin (LIPITOR) 40 MG tablet   Other Relevant Orders   CBC with Differential/Platelet (Completed)   Lipid Panel w/o Chol/HDL Ratio (Completed)   Comprehensive metabolic panel (Completed)   Diabetic retinopathy associated with type 1 diabetes mellitus ([place])    Doing great with A1c of 6.4! Continue diet and exercise. Continue current regimen. Continue to monitor. Call with any concerns.       Relevant Medications   lisinopril (ZESTRIL) 10 MG tablet   atorvastatin (LIPITOR) 40 MG tablet     Other   Depression, recurrent ([place])     Continue to follow with psychiatry. Call with any concerns. Continue to monitor.       Hyperlipidemia    Under good control on current regimen. Continue current regimen. Continue to monitor. Call with any concerns. Refills given. Labs drawn today.       Relevant Medications   lisinopril (ZESTRIL) 10 MG tablet   atorvastatin (LIPITOR) 40 MG tablet   Other Relevant Orders   CBC with Differential/Platelet (Completed)   Comprehensive metabolic panel (Completed)   Bipolar 2 disorder ([place])    Continue to follow with psychiatry. Call with any concerns. Continue to monitor.       Bipolar disorder, in full remission, most recent episode hypomanic ([place])    Continue to follow with psychiatry. Call with any concerns. Continue to monitor.       Seizure-like activity ([place])    No seizures. Rechecking labs today. Await results. Treat as needed.       Relevant Orders   Lamotrigine level (Completed)   Other Visit Diagnoses     Need for influenza vaccination       Relevant Orders   Flu Vaccine QUAD 53moIM (Fluarix, Fluzone & Alfiuria Quad PF) (Completed)   Essential hypertension       Relevant Medications   lisinopril (ZESTRIL) 10 MG tablet   atorvastatin (LIPITOR) 40 MG tablet        Follow up plan: Return in about 6 months (around  04/27/2021).

## 2020-10-31 ENCOUNTER — Encounter: Payer: Self-pay | Admitting: Family Medicine

## 2020-10-31 LAB — CBC WITH DIFFERENTIAL/PLATELET
Basophils Absolute: 0.1 10*3/uL (ref 0.0–0.2)
Basos: 1 %
EOS (ABSOLUTE): 0.3 10*3/uL (ref 0.0–0.4)
Eos: 5 %
Hematocrit: 45.3 % (ref 37.5–51.0)
Hemoglobin: 15.5 g/dL (ref 13.0–17.7)
Immature Grans (Abs): 0 10*3/uL (ref 0.0–0.1)
Immature Granulocytes: 0 %
Lymphocytes Absolute: 1.7 10*3/uL (ref 0.7–3.1)
Lymphs: 25 %
MCH: 30.5 pg (ref 26.6–33.0)
MCHC: 34.2 g/dL (ref 31.5–35.7)
MCV: 89 fL (ref 79–97)
Monocytes Absolute: 0.5 10*3/uL (ref 0.1–0.9)
Monocytes: 7 %
Neutrophils Absolute: 4.2 10*3/uL (ref 1.4–7.0)
Neutrophils: 62 %
Platelets: 225 10*3/uL (ref 150–450)
RBC: 5.08 x10E6/uL (ref 4.14–5.80)
RDW: 12.2 % (ref 11.6–15.4)
WBC: 6.8 10*3/uL (ref 3.4–10.8)

## 2020-10-31 LAB — COMPREHENSIVE METABOLIC PANEL
ALT: 30 IU/L (ref 0–44)
AST: 27 IU/L (ref 0–40)
Albumin/Globulin Ratio: 3.3 — ABNORMAL HIGH (ref 1.2–2.2)
Albumin: 4.9 g/dL (ref 4.0–5.0)
Alkaline Phosphatase: 62 IU/L (ref 44–121)
BUN/Creatinine Ratio: 6 — ABNORMAL LOW (ref 9–20)
BUN: 7 mg/dL (ref 6–24)
Bilirubin Total: 0.5 mg/dL (ref 0.0–1.2)
CO2: 23 mmol/L (ref 20–29)
Calcium: 9.6 mg/dL (ref 8.7–10.2)
Chloride: 103 mmol/L (ref 96–106)
Creatinine, Ser: 1.1 mg/dL (ref 0.76–1.27)
Globulin, Total: 1.5 g/dL (ref 1.5–4.5)
Glucose: 91 mg/dL (ref 65–99)
Potassium: 4.8 mmol/L (ref 3.5–5.2)
Sodium: 143 mmol/L (ref 134–144)
Total Protein: 6.4 g/dL (ref 6.0–8.5)
eGFR: 85 mL/min/{1.73_m2} (ref >59)

## 2020-10-31 LAB — LIPID PANEL W/O CHOL/HDL RATIO
Cholesterol, Total: 123 mg/dL (ref 100–199)
HDL: 48 mg/dL (ref >39)
LDL Chol Calc (NIH): 64 mg/dL (ref 0–99)
Triglycerides: 47 mg/dL (ref 0–149)
VLDL Cholesterol Cal: 11 mg/dL (ref 5–40)

## 2020-10-31 LAB — LAMOTRIGINE LEVEL: Lamotrigine Lvl: 1.8 ug/mL — ABNORMAL LOW (ref 2.0–20.0)

## 2020-10-31 NOTE — Assessment & Plan Note (Signed)
Continue to follow with psychiatry. Call with any concerns. Continue to monitor.

## 2020-10-31 NOTE — Assessment & Plan Note (Signed)
Under good control on current regimen. Continue current regimen. Continue to monitor. Call with any concerns. Refills given. Labs drawn today.   

## 2020-10-31 NOTE — Assessment & Plan Note (Signed)
No seizures. Rechecking labs today. Await results. Treat as needed.

## 2020-10-31 NOTE — Assessment & Plan Note (Signed)
Doing great with A1c of 6.4! Continue diet and exercise. Continue current regimen. Continue to monitor. Call with any concerns.

## 2020-11-03 LAB — HM DIABETES EYE EXAM

## 2020-11-11 ENCOUNTER — Ambulatory Visit (INDEPENDENT_AMBULATORY_CARE_PROVIDER_SITE_OTHER): Payer: 59 | Admitting: Clinical

## 2020-11-11 ENCOUNTER — Other Ambulatory Visit: Payer: Self-pay

## 2020-11-11 DIAGNOSIS — F3172 Bipolar disorder, in full remission, most recent episode hypomanic: Secondary | ICD-10-CM

## 2020-11-11 DIAGNOSIS — F411 Generalized anxiety disorder: Secondary | ICD-10-CM

## 2020-11-11 NOTE — Progress Notes (Signed)
Virtual Visit via Video Note   I connected with Bernard [name] on 11/11/20 at 10:00 AM EDT by a video enabled telemedicine application and verified that I am speaking with the correct person using two identifiers.   Location: Patient: Home Provider: Office   I discussed the limitations of evaluation and management by telemedicine and the availability of in person appointments. The patient expressed understanding and agreed to proceed.   THERAPIST PROGRESS NOTE   Session Time: 10:00AM-10:45AM   Participation Level: Active   Behavioral Response: CasualAlertAnxious   Type of Therapy: Individual Therapy   Treatment Goals addressed: Coping   Interventions: CBT and Solution Focused   Summary: Bernard [name] is a 45 y.o. male who presents with  Bipolar Disorder and GAD. The OPT therapist worked with the patient for his  ongoing OPT treatment. The OPT therapist utilized Motivational Interviewing to assist in creating therapeutic repore. The patient in the session was engaged and work in collaboration giving feedback about his triggers and symptoms . The patient in this session spoke about being in [place] to visit with family for the weekend. The OPT therapist utilized Cognitive Behavioral Therapy through cognitive restructuring as well as worked with the patient in review of behaviors and interactions over the past few weeks. The OPT therapist worked with the patient about continuing to work on focusing his energy in areas he can have control.The OPT therapist for holistic care reviewed the patients Medication Therapy.    Suicidal/Homicidal: Nowithout intent/plan   Therapist Response: The OPT therapist worked with the patient for the patients scheduled session. The patient was engaged in his session and gave feedback in relation to triggers, symptoms, and behavior responses over the past few weeks. The OPT therapist worked with the patient utilizing an in session Cognitive  Behavioral Therapy exercise. The patient spoke about by changing his perspective and arranging his priorities this being a help and healthier approach. The patient noted, "  One of the struggles we have talked about is my interaction and relationship with my family members and I did have a incident where I unattested was snippy with my wife". The patient has been consistent with his medication therapy and noted this is helpful but is planning on talking with prescriber about making some changes to further assist with mood management. The patient spoke about enjoying the trip he is currently on visiting family out of state. The OPT therapist will continue treatment work with the patient in his next scheduled session   Plan: Return again in 2/3 weeks.   Diagnosis:      Axis I: GAD and Bipolar Disorder                           Axis II: No diagnosis   I discussed the assessment and treatment plan with the patient. The patient was provided an opportunity to ask questions and all were answered. The patient agreed with the plan and demonstrated an understanding of the instructions.   The patient was advised to call back or seek an in-person evaluation if the symptoms worsen or if the condition fails to improve as anticipated.   I provided 45 minutes of non-face-to-face time during this encounter.   Lennox Grumbles, LCSW   11/11/2020

## 2020-12-02 ENCOUNTER — Other Ambulatory Visit: Payer: Self-pay

## 2020-12-02 ENCOUNTER — Ambulatory Visit (INDEPENDENT_AMBULATORY_CARE_PROVIDER_SITE_OTHER): Payer: 59 | Admitting: Clinical

## 2020-12-02 DIAGNOSIS — F411 Generalized anxiety disorder: Secondary | ICD-10-CM | POA: Diagnosis not present

## 2020-12-02 DIAGNOSIS — F3172 Bipolar disorder, in full remission, most recent episode hypomanic: Secondary | ICD-10-CM | POA: Diagnosis not present

## 2020-12-02 NOTE — Progress Notes (Signed)
Virtual Visit via Video Note   I connected with Bernard [name] on 12/02/20 at 10:00 AM EDT by a video enabled telemedicine application and verified that I am speaking with the correct person using two identifiers.   Location: Patient: Home Provider: Office   I discussed the limitations of evaluation and management by telemedicine and the availability of in person appointments. The patient expressed understanding and agreed to proceed.   THERAPIST PROGRESS NOTE   Session Time: 10:00AM-10:45AM   Participation Level: Active   Behavioral Response: CasualAlertAnxious   Type of Therapy: Individual Therapy   Treatment Goals addressed: Coping   Interventions: CBT and Solution Focused   Summary: Bernard [name] is a 45 y.o. male who presents with  Bipolar Disorder and GAD. The OPT therapist worked with the patient for his  ongoing OPT treatment. The OPT therapist utilized Motivational Interviewing to assist in creating therapeutic repore. The patient in the session was engaged and work in collaboration giving feedback about his triggers and symptoms . The patient in this session spoke about utilizing communication as a technique with his wife and found this to be an effective method. The OPT therapist utilized Cognitive Behavioral Therapy through cognitive restructuring as well as worked with the patient in review of behaviors and interactions over the past few weeks. The OPT therapist worked with the patient about continuing to work on focusing his energy in areas he can have control.The OPT therapist for holistic care reviewed the patients Medication Therapy. The patient spoke about finding therapy techniques helpful with emotion control and problem solving.   Suicidal/Homicidal: Nowithout intent/plan   Therapist Response: The OPT therapist worked with the patient for the patients scheduled session. The patient was engaged in his session and gave feedback in relation to triggers,  symptoms, and behavior responses over the past few weeks. The OPT therapist worked with the patient utilizing an in session Cognitive Behavioral Therapy exercise. The patient spoke about by changing his perspective and arranging his priorities this being a help and healthier approach. The patient noted, "  3 out of 3 times in using the communication strategy it has been effective". The OPT therapist continued emotion control, time management, and positive thinking work with the patient in this session. The OPT therapist will continue treatment work with the patient in his next scheduled session   Plan: Return again in 2/3 weeks.   Diagnosis:      Axis I: GAD and Bipolar Disorder                           Axis II: No diagnosis   I discussed the assessment and treatment plan with the patient. The patient was provided an opportunity to ask questions and all were answered. The patient agreed with the plan and demonstrated an understanding of the instructions.   The patient was advised to call back or seek an in-person evaluation if the symptoms worsen or if the condition fails to improve as anticipated.   I provided 45 minutes of non-face-to-face time during this encounter.   Lennox Grumbles, LCSW   12/02/2020

## 2020-12-19 ENCOUNTER — Other Ambulatory Visit: Payer: Self-pay | Admitting: Psychiatry

## 2020-12-19 DIAGNOSIS — F3172 Bipolar disorder, in full remission, most recent episode hypomanic: Secondary | ICD-10-CM

## 2020-12-22 ENCOUNTER — Telehealth (INDEPENDENT_AMBULATORY_CARE_PROVIDER_SITE_OTHER): Payer: 59 | Admitting: Psychiatry

## 2020-12-22 ENCOUNTER — Other Ambulatory Visit: Payer: Self-pay

## 2020-12-22 ENCOUNTER — Encounter: Payer: Self-pay | Admitting: Psychiatry

## 2020-12-22 DIAGNOSIS — F411 Generalized anxiety disorder: Secondary | ICD-10-CM | POA: Diagnosis not present

## 2020-12-22 DIAGNOSIS — F3181 Bipolar II disorder: Secondary | ICD-10-CM | POA: Diagnosis not present

## 2020-12-22 DIAGNOSIS — F17200 Nicotine dependence, unspecified, uncomplicated: Secondary | ICD-10-CM

## 2020-12-22 MED ORDER — ARIPIPRAZOLE 2 MG PO TABS: 2.0000 mg | ORAL_TABLET | Freq: Every day | ORAL | 1 refills | Status: DC

## 2020-12-22 NOTE — Progress Notes (Signed)
Virtual Visit via Video Note  I connected with Bernard [name] on 12/22/20 at  9:30 AM EST by a video enabled telemedicine application and verified that I am speaking with the correct person using two identifiers.  Location Provider Location : [facility] Patient Location : Home  Participants: Patient , Provider    I discussed the limitations of evaluation and management by telemedicine and the availability of in person appointments. The patient expressed understanding and agreed to proceed.    I discussed the assessment and treatment plan with the patient. The patient was provided an opportunity to ask questions and all were answered. The patient agreed with the plan and demonstrated an understanding of the instructions.   The patient was advised to call back or seek an in-person evaluation if the symptoms worsen or if the condition fails to improve as anticipated.    [place] MD OP Progress Note  12/22/2020 3:40 PM Bernard [name]  MRN:  [id number]  Chief Complaint:  Chief Complaint   Follow-up; Anxiety    HPI: Bernard [name] is a 45 year old Caucasian male, employed, lives in [facility], has a history of bipolar disorder type II, GAD, tobacco use disorder, was evaluated by telemedicine today.  Patient today reports he is currently recovering from the flu.  This children also was positive recently and has recovered.  Patient continues to feel tired and has upper respiratory tract infection symptoms.  Patient reports he has noticed himself to be more irritable since the past 1 month.  He feels 'snappy' often.  Patient reports the first time he decided to get help was because of the symptoms of feeling 'snappy' and he does not want it to get worse.  He denies any depression.  He reports sleep is overall good.  Denies any other manic or hypomanic symptoms at this time.  Patient denies suicidality, homicidality.  Patient continues to report seeing shapes, questionable  visual hallucinations when he wakes up from his sleep, last for a few minutes, does not bother him.  Patient reports he continues to smoke cigarettes, did not tolerate the Chantix which caused him side effects of insomnia.  He has successfully cut back on his caffeine use, used to drink 2 pots of coffee previously however currently couple of cups of coffee.  However he feels his irritability started even before he cut back on his caffeine use.  Patient was referred for neuropsychological testing to [facility] program.  He reports he is currently working with them.  Patient continues to follow-up with his therapist.  Patient denies any other concerns today.  Visit Diagnosis:    ICD-10-CM   1. Bipolar 2 disorder (HCC)  F31.81 ARIPiprazole (ABILIFY) 2 MG tablet   hypomanic, mild , mixed features    2. GAD (generalized anxiety disorder)  F41.1 ARIPiprazole (ABILIFY) 2 MG tablet    3. Tobacco use disorder  F17.200       Past Psychiatric History: Reviewed past psychiatric history from progress note on 02/18/2018  Past Medical History:  Past Medical History:  Diagnosis Date   Anxiety    Depression    Esophageal stricture    Hyperlipidemia    Hypertension    Weight loss     Past Surgical History:  Procedure Laterality Date   PILONIDAL CYST EXCISION     WISDOM TOOTH EXTRACTION      Family Psychiatric History: Reviewed family psychiatric history from progress note on 02/18/2018  Family History:  Family History  Problem Relation Age of Onset  Cancer Mother    Anxiety disorder Mother    Depression Mother    Cancer Father    Anxiety disorder Brother    Depression Brother     Social History: Reviewed social history from progress note on 02/18/2018 Social History   Socioeconomic History   Marital status: Married    Spouse name: joan    Number of children: 2   Years of education: Not on file   Highest education level: Bachelor's degree (e.g., BA, AB, BS)  Occupational History    Not on file  Tobacco Use   Smoking status: Every Day    Packs/day: 0.25    Types: Cigarettes    Start date: 01/22/2017   Smokeless tobacco: Never   Tobacco comments:    started back again, 1/3 pack per day- reported 11/17/2019  Vaping Use   Vaping Use: Never used  Substance and Sexual Activity   Alcohol use: Yes    Alcohol/week: 2.0 - 6.0 standard drinks    Types: 2 - 6 Cans of beer per week    Comment: on occasion   Drug use: No   Sexual activity: Yes    Birth control/protection: None  Other Topics Concern   Not on file  Social History Narrative   Not on file   Social Determinants of Health   Financial Resource Strain: Not on file  Food Insecurity: Not on file  Transportation Needs: Not on file  Physical Activity: Not on file  Stress: Not on file  Social Connections: Not on file    Allergies:  Allergies  Allergen Reactions   Chantix [Varenicline] Other (See Comments)    Insomnia , shakes    Metabolic Disorder Labs: Lab Results  Component Value Date   HGBA1C 6.4 (H) 10/28/2020   No results found for: PROLACTIN Lab Results  Component Value Date   CHOL 123 10/28/2020   TRIG 47 10/28/2020   HDL 48 10/28/2020   CHOLHDL 3.3 04/17/2018   VLDL 13 08/06/2016   LDLCALC 64 10/28/2020   LDLCALC 65 05/03/2020   Lab Results  Component Value Date   TSH 2.000 05/03/2020   TSH 1.950 04/20/2019    Therapeutic Level Labs: No results found for: LITHIUM No results found for: VALPROATE No components found for:  CBMZ  Current Medications: Current Outpatient Medications  Medication Sig Dispense Refill   ARIPiprazole (ABILIFY) 2 MG tablet Take 1 tablet (2 mg total) by mouth daily. 30 tablet 1   atorvastatin (LIPITOR) 40 MG tablet Take 1 tablet (40 mg total) by mouth daily. 90 tablet 1   busPIRone (BUSPAR) 10 MG tablet TAKE (1) TABLET BY MOUTH THREE TIMES A DAY 90 tablet 2   Cetirizine HCl 10 MG CAPS Take 10 mg by mouth daily.     Continuous Blood Gluc Sensor  (DEXCOM G6 SENSOR) MISC SMARTSIG:1 Each Topical Every 10 Days     Continuous Blood Gluc Transmit (DEXCOM G6 TRANSMITTER) MISC USE 1 EACH EVERY 3 (THREE) MONTHS     insulin glargine (LANTUS) 100 UNIT/ML Solostar Pen 50 units daily when off pump     Insulin Infusion Pump Supplies (AUTOSOFT XC INFUSION SET) MISC T slim infusion sets.  Change infusion set every 2-3 days     Insulin Infusion Pump Supplies (T:SLIM X2 3ML CARTRIDGE) MISC T slim cartridges:  Change cartridge every 2-3 days     Insulin Pen Needle 29G X 12.7MM MISC 1 each by Does not apply route 3 (three) times daily. 100 each 12  lamoTRIgine (LAMICTAL) 25 MG tablet TAKE (3) TABLETS BY MOUTH EVERY DAY. 90 tablet 2   Lancets (ONETOUCH DELICA PLUS [id number]) MISC      lisinopril (ZESTRIL) 10 MG tablet Take 1 tablet (10 mg total) by mouth daily. 90 tablet 1   NOVOLOG 100 UNIT/ML injection Inject 50 Units into the skin.     ondansetron (ZOFRAN ODT) 4 MG disintegrating tablet Take 1 tablet (4 mg total) by mouth every 8 (eight) hours as needed for nausea or vomiting. 90 tablet 1   pantoprazole (PROTONIX) 40 MG tablet TAKE (1) TABLET BY MOUTH EVERY DAY 90 tablet 1   ranitidine (ZANTAC) 150 MG capsule Take 150 mg by mouth daily.     rOPINIRole (REQUIP) 0.5 MG tablet Take 1 tablet (0.5 mg total) by mouth at bedtime. 90 tablet 1   simethicone (GAS-X) 80 MG chewable tablet Chew 1 tablet (80 mg total) by mouth every 6 (six) hours as needed for flatulence (or cramping). 90 tablet 3   No current facility-administered medications for this visit.     Musculoskeletal: Strength & Muscle Tone: within normal limits Gait & Station:  seated Patient leans: N/A  Psychiatric Specialty Exam: Review of Systems  Constitutional:  Positive for fatigue.  HENT:  Positive for congestion and sore throat.   Psychiatric/Behavioral:         Irritable  All other systems reviewed and are negative.  There were no vitals taken for this visit.There is no height or  weight on file to calculate BMI.  General Appearance: Casual  Eye Contact:  Fair  Speech:  Clear and Coherent  Volume:  Normal  Mood:  Irritable  Affect:  Appropriate  Thought Process:  Goal Directed and Descriptions of Associations: Intact  Orientation:  Full (Time, Place, and Person)  Thought Content: Logical   Suicidal Thoughts:  No  Homicidal Thoughts:  No  Memory:  Immediate;   Fair Recent;   Fair Remote;   Fair  Judgement:  Fair  Insight:  Good  Psychomotor Activity:  Normal  Concentration:  Concentration: Fair and Attention Span: Fair  Recall:  [employer] of Knowledge: Fair  Language: Fair  Akathisia:  No  Handed:  Right  AIMS (if indicated): done, 0  Assets:  Communication Skills Desire for Improvement Housing Social Support  ADL's:  Intact  Cognition: WNL  Sleep:  Fair   Screenings: GAD-7    Flowsheet Row Office Visit from 10/28/2020 in [place] from 07/26/2020 in [facility] [facility] Video Visit from 05/09/2020 in [place]  Total GAD-7 Score 4 4 4       PHQ2-9    Flowsheet Row Video Visit from 12/22/2020 in [place] Office Visit from 10/28/2020 in [facility] Video Visit from 06/30/2020 in [place] Video Visit from 05/09/2020 in [place] Visit from 05/03/2020 in [place]  PHQ-2 Total Score 4 1 0 1 0  PHQ-9 Total Score 6 4 -- -- 3      Flowsheet Row Counselor from 07/26/2020 in [facility] [facility] Video Visit from 05/09/2020 in [place] No Risk No Risk        Assessment and Plan: Bernard [name] is a 45 year old male, employed, married, lives in [place], has a history of bipolar disorder, anxiety disorder, diabetes, hypertension, hyperlipidemia, was  evaluated by telemedicine today.  Patient with recent irritability, is interested  in restarting Abilify.  Discussed plan as noted below.  Plan Bipolar type II disorder, hypomanic with mixed features-unstable Restart Abilify 2 mg p.o. daily in the morning Lamotrigine 75 mg p.o. daily-reduced dosage.  We will consider increasing the dosage as needed.   GAD-stable BuSpar 10 mg p.o. 3 times daily Continue CBT with Mr. Maye Hides  Tobacco use disorder-unstable We will monitor closely  Patient to follow up with his primary provider for flulike symptoms as needed.  Patient also was referred for neuropsychological testing-rule out autism spectrum disorder-based on patient preference.  Currently pending.  Follow-up in clinic in 4 weeks or sooner if needed.  This note was generated in part or whole with voice recognition software. Voice recognition is usually quite accurate but there are transcription errors that can and very often do occur. I apologize for any typographical errors that were not detected and corrected.     Ursula Alert, MD 12/22/2020, 3:40 PM

## 2020-12-23 ENCOUNTER — Ambulatory Visit (INDEPENDENT_AMBULATORY_CARE_PROVIDER_SITE_OTHER): Payer: 59 | Admitting: Clinical

## 2020-12-23 ENCOUNTER — Other Ambulatory Visit: Payer: Self-pay

## 2020-12-23 DIAGNOSIS — F411 Generalized anxiety disorder: Secondary | ICD-10-CM | POA: Diagnosis not present

## 2020-12-23 DIAGNOSIS — F3181 Bipolar II disorder: Secondary | ICD-10-CM

## 2020-12-23 NOTE — Progress Notes (Signed)
Virtual Visit via Video Note   I connected with Bernard [name] on 12/23/20 at 10:00 AM EDT by a video enabled telemedicine application and verified that I am speaking with the correct person using two identifiers.   Location: Patient: Home Provider: Office   I discussed the limitations of evaluation and management by telemedicine and the availability of in person appointments. The patient expressed understanding and agreed to proceed.   THERAPIST PROGRESS NOTE   Session Time: 10:00AM-10:45AM   Participation Level: Active   Behavioral Response: CasualAlertAnxious   Type of Therapy: Individual Therapy   Treatment Goals addressed: Coping   Interventions: CBT and Solution Focused   Summary: Bernard [name] is a 45 y.o. male who presents with  Bipolar Disorder and GAD. The OPT therapist worked with the patient for his  ongoing OPT treatment. The OPT therapist utilized Motivational Interviewing to assist in creating therapeutic repore. The patient in the session was engaged and work in collaboration giving feedback about his triggers and symptoms . The patient in this session spoke about the family getting the Flu and this impacting his past week. The OPT therapist utilized Cognitive Behavioral Therapy through cognitive restructuring as well as worked with the patient in review of behaviors and interactions over the past few weeks. The OPT therapist worked with the patient about continuing to work on focusing his energy in areas he can have control.The OPT therapist for holistic care reviewed the patients Medication Therapy. The patient spoke about finding therapy techniques helpful with emotion control and problem solving and that he has when able implementing these techniques. The patient spoke in this session about mental health being a factor in his family. The patient in this session spoke about his interactions with his brother over this past year and how mental health has been  effecting his brothers behavior.   Suicidal/Homicidal: Nowithout intent/plan   Therapist Response: The OPT therapist worked with the patient for the patients scheduled session. The patient was engaged in his session and gave feedback in relation to triggers, symptoms, and behavior responses over the past few weeks. The OPT therapist worked with the patient utilizing an in session Cognitive Behavioral Therapy exercise. The patient spoke about by changing his perspective and arranging his priorities this being a help and healthier approach. The patient noted, "  I think the therapy is helpful and we may be coming to a end of needing it I think there has been a lot of progress". The OPT therapist continued emotion control, time management, and positive thinking work with the patient in this session. The OPT therapist will continue treatment work with the patient in his next scheduled session in preparation for formal successful Discharge   Plan: Return again in 2/3 weeks.   Diagnosis:      Axis I: GAD and Bipolar Disorder                           Axis II: No diagnosis   I discussed the assessment and treatment plan with the patient. The patient was provided an opportunity to ask questions and all were answered. The patient agreed with the plan and demonstrated an understanding of the instructions.   The patient was advised to call back or seek an in-person evaluation if the symptoms worsen or if the condition fails to improve as anticipated.   I provided 45 minutes of non-face-to-face time during this encounter.   Lennox Grumbles, LCSW  12/23/2020 

## 2021-01-19 ENCOUNTER — Telehealth (INDEPENDENT_AMBULATORY_CARE_PROVIDER_SITE_OTHER): Payer: 59 | Admitting: Psychiatry

## 2021-01-19 ENCOUNTER — Encounter: Payer: Self-pay | Admitting: Psychiatry

## 2021-01-19 ENCOUNTER — Other Ambulatory Visit: Payer: Self-pay

## 2021-01-19 DIAGNOSIS — F3178 Bipolar disorder, in full remission, most recent episode mixed: Secondary | ICD-10-CM | POA: Diagnosis not present

## 2021-01-19 DIAGNOSIS — F411 Generalized anxiety disorder: Secondary | ICD-10-CM | POA: Diagnosis not present

## 2021-01-19 DIAGNOSIS — Z79899 Other long term (current) drug therapy: Secondary | ICD-10-CM

## 2021-01-19 DIAGNOSIS — F17200 Nicotine dependence, unspecified, uncomplicated: Secondary | ICD-10-CM

## 2021-01-19 MED ORDER — BUSPIRONE HCL 10 MG PO TABS: ORAL_TABLET | ORAL | 2 refills | Status: DC

## 2021-01-19 NOTE — Progress Notes (Signed)
Virtual Visit via Video Note  I connected with Bernard [name] on 01/19/21 at  1:30 PM EST by a video enabled telemedicine application and verified that I am speaking with the correct person using two identifiers.  Location Provider Location : [facility] Patient Location : Home  Participants: Patient , Provider   I discussed the limitations of evaluation and management by telemedicine and the availability of in person appointments. The patient expressed understanding and agreed to proceed.    I discussed the assessment and treatment plan with the patient. The patient was provided an opportunity to ask questions and all were answered. The patient agreed with the plan and demonstrated an understanding of the instructions.   The patient was advised to call back or seek an in-person evaluation if the symptoms worsen or if the condition fails to improve as anticipated.    [place] MD OP Progress Note  01/19/2021 1:50 PM Bernard [name]  MRN:  [id number]  Chief Complaint:  Chief Complaint   Follow-up; Depression; Anxiety    HPI: Bernard [name] is a 45 year old Caucasian male, employed, lives in [place], married, has a history of bipolar disorder type II, GAD, tobacco use disorder was evaluated by telemedicine today.  Patient today reports since being back on the Abilify he is doing great.  Denies any significant irritability.  Patient denies any depression or anxiety symptoms.  Reports sleep and appetite is fair.  Patient reports he continues to follow-up with his therapist however is planning to terminate that soon since he feels he is at a good place now.  Patient denies any suicidality, homicidality or perceptual disturbances.  Patient denies any other concerns today.  Visit Diagnosis:    ICD-10-CM   1. Bipolar disorder, in full remission, most recent episode mixed (HCC)  F31.78 Prolactin   Type 2    2. GAD (generalized anxiety disorder)  F41.1 busPIRone (BUSPAR)  10 MG tablet    3. Tobacco use disorder  F17.200     4. High risk medication use  Z79.899 Prolactin      Past Psychiatric History: Reviewed past psychiatric history from progress note on 02/18/2018  Past Medical History:  Past Medical History:  Diagnosis Date   Anxiety    Depression    Esophageal stricture    Hyperlipidemia    Hypertension    Weight loss     Past Surgical History:  Procedure Laterality Date   PILONIDAL CYST EXCISION     WISDOM TOOTH EXTRACTION      Family Psychiatric History: Reviewed family psychiatric history from progress note on 02/18/2018  Family History:  Family History  Problem Relation Age of Onset   Cancer Mother    Anxiety disorder Mother    Depression Mother    Cancer Father    Anxiety disorder Brother    Depression Brother     Social History: Reviewed social history from progress note on 02/18/2018 Social History   Socioeconomic History   Marital status: Married    Spouse name: joan    Number of children: 2   Years of education: Not on file   Highest education level: Bachelor's degree (e.g., BA, AB, BS)  Occupational History   Not on file  Tobacco Use   Smoking status: Every Day    Packs/day: 0.25    Types: Cigarettes    Start date: 01/22/2017   Smokeless tobacco: Never   Tobacco comments:    1/2 of cigarettes 6 per day- 01/19/2021  Vaping Use  Vaping Use: Never used  Substance and Sexual Activity   Alcohol use: Yes    Alcohol/week: 2.0 - 6.0 standard drinks    Types: 2 - 6 Cans of beer per week    Comment: on occasion   Drug use: No   Sexual activity: Yes    Birth control/protection: None  Other Topics Concern   Not on file  Social History Narrative   Not on file   Social Determinants of Health   Financial Resource Strain: Not on file  Food Insecurity: Not on file  Transportation Needs: Not on file  Physical Activity: Not on file  Stress: Not on file  Social Connections: Not on file    Allergies:   Allergies  Allergen Reactions   Chantix [Varenicline] Other (See Comments)    Insomnia , shakes    Metabolic Disorder Labs: Lab Results  Component Value Date   HGBA1C 6.4 (H) 10/28/2020   No results found for: PROLACTIN Lab Results  Component Value Date   CHOL 123 10/28/2020   TRIG 47 10/28/2020   HDL 48 10/28/2020   CHOLHDL 3.3 04/17/2018   VLDL 13 08/06/2016   LDLCALC 64 10/28/2020   LDLCALC 65 05/03/2020   Lab Results  Component Value Date   TSH 2.000 05/03/2020   TSH 1.950 04/20/2019    Therapeutic Level Labs: No results found for: LITHIUM No results found for: VALPROATE No components found for:  CBMZ  Current Medications: Current Outpatient Medications  Medication Sig Dispense Refill   Insulin Disposable Pump (OMNIPOD 5 G6 INTRO, GEN 5,) KIT Inject into the skin.     ARIPiprazole (ABILIFY) 2 MG tablet Take 1 tablet (2 mg total) by mouth daily. 30 tablet 1   atorvastatin (LIPITOR) 40 MG tablet Take 1 tablet (40 mg total) by mouth daily. 90 tablet 1   busPIRone (BUSPAR) 10 MG tablet TAKE (1) TABLET BY MOUTH THREE TIMES A DAY 90 tablet 2   Cetirizine HCl 10 MG CAPS Take 10 mg by mouth daily.     Continuous Blood Gluc Sensor (DEXCOM G6 SENSOR) MISC SMARTSIG:1 Each Topical Every 10 Days     Continuous Blood Gluc Transmit (DEXCOM G6 TRANSMITTER) MISC USE 1 EACH EVERY 3 (THREE) MONTHS     famotidine (PEPCID) 20 MG tablet Take 20 mg by mouth 2 (two) times daily.     insulin glargine (LANTUS) 100 UNIT/ML Solostar Pen 50 units daily when off pump     Insulin Infusion Pump Supplies (AUTOSOFT XC INFUSION SET) MISC T slim infusion sets.  Change infusion set every 2-3 days     Insulin Infusion Pump Supplies (T:SLIM X2 3ML CARTRIDGE) MISC T slim cartridges:  Change cartridge every 2-3 days     Insulin Pen Needle 29G X 12.7MM MISC 1 each by Does not apply route 3 (three) times daily. 100 each 12   lamoTRIgine (LAMICTAL) 25 MG tablet TAKE (3) TABLETS BY MOUTH EVERY DAY. 90  tablet 2   Lancets (ONETOUCH DELICA PLUS [id number]) MISC      lisinopril (ZESTRIL) 10 MG tablet Take 1 tablet (10 mg total) by mouth daily. 90 tablet 1   NOVOLOG 100 UNIT/ML injection Inject 50 Units into the skin.     ondansetron (ZOFRAN ODT) 4 MG disintegrating tablet Take 1 tablet (4 mg total) by mouth every 8 (eight) hours as needed for nausea or vomiting. 90 tablet 1   pantoprazole (PROTONIX) 40 MG tablet TAKE (1) TABLET BY MOUTH EVERY DAY 90 tablet 1  ranitidine (ZANTAC) 150 MG capsule Take 150 mg by mouth daily.     rOPINIRole (REQUIP) 0.5 MG tablet Take 1 tablet (0.5 mg total) by mouth at bedtime. 90 tablet 1   simethicone (GAS-X) 80 MG chewable tablet Chew 1 tablet (80 mg total) by mouth every 6 (six) hours as needed for flatulence (or cramping). 90 tablet 3   No current facility-administered medications for this visit.     Musculoskeletal: Strength & Muscle Tone:  UTA Gait & Station:  Seated Patient leans: N/A  Psychiatric Specialty Exam: Review of Systems  Psychiatric/Behavioral:  Negative for agitation, behavioral problems, confusion, decreased concentration, dysphoric mood, hallucinations, self-injury, sleep disturbance and suicidal ideas. The patient is not nervous/anxious and is not hyperactive.   All other systems reviewed and are negative.  There were no vitals taken for this visit.There is no height or weight on file to calculate BMI.  General Appearance: Casual  Eye Contact:  Fair  Speech:  Clear and Coherent  Volume:  Normal  Mood:  Euthymic  Affect:  Congruent  Thought Process:  Goal Directed and Descriptions of Associations: Intact  Orientation:  Full (Time, Place, and Person)  Thought Content: Logical   Suicidal Thoughts:  No  Homicidal Thoughts:  No  Memory:  Immediate;   Fair Recent;   Fair Remote;   Fair  Judgement:  Fair  Insight:  Fair  Psychomotor Activity:  Normal  Concentration:  Concentration: Fair and Attention Span: Fair  Recall:  [employer] of Knowledge: Fair  Language: Fair  Akathisia:  No  Handed:  Right  AIMS (if indicated): done, 0  Assets:  Communication Skills Desire for Improvement Housing Social Support  ADL's:  Intact  Cognition: WNL  Sleep:  Fair   Screenings: GAD-7    Flowsheet Row Office Visit from 10/28/2020 in [place] from 07/26/2020 in [facility] [facility] Video Visit from 05/09/2020 in [place]  Total GAD-7 Score _0 PHQ2-9    Flowsheet Row Video Visit from 12/22/2020 in [place] Office Visit from 10/28/2020 in [facility] Video Visit from 06/30/2020 in [place] Video Visit from 05/09/2020 in [place] Visit from 05/03/2020 in [place]  PHQ-2 Total Score 4 1 0 1 0  PHQ-9 Total Score 6 4 -- -- 3      Flowsheet Row Counselor from 07/26/2020 in [place] [facility] Video Visit from 05/09/2020 in [place] No Risk No Risk        Assessment and Plan: Elvyn Krohn is a 45 year old male, employed, married, lives in [place], has a history of bipolar disorder, anxiety disorder, diabetes, hypertension, hyperlipidemia was evaluated by telemedicine today.  Patient is currently stable.  Plan as noted below.  Plan Bipolar disorder type II, in remission Abilify 2 mg p.o. daily in the morning Lamotrigine 75 mg p.o. daily-reduced dosage.  GAD-stable BuSpar 10 mg p.o. 3 times daily Continue CBT with Mr. Maye Hides as needed  Tobacco use disorder-improving Patient is currently cutting back, provided counseling for 2 minutes.  High risk medication use-will order prolactin level.  Patient to go to [facility].  Follow-up in clinic in 3 months or sooner in person.  This note was generated in  part or whole with voice recognition software. Voice recognition is usually quite accurate but there are transcription errors that  can and very often do occur. I apologize for any typographical errors that were not detected and corrected.      Ursula Alert, MD 01/19/2021, 1:50 PM

## 2021-01-20 ENCOUNTER — Ambulatory Visit (INDEPENDENT_AMBULATORY_CARE_PROVIDER_SITE_OTHER): Payer: 59 | Admitting: Clinical

## 2021-01-20 ENCOUNTER — Encounter: Payer: Self-pay | Admitting: Psychiatry

## 2021-01-20 ENCOUNTER — Other Ambulatory Visit: Payer: Self-pay

## 2021-01-20 DIAGNOSIS — F411 Generalized anxiety disorder: Secondary | ICD-10-CM | POA: Diagnosis not present

## 2021-01-20 DIAGNOSIS — F3172 Bipolar disorder, in full remission, most recent episode hypomanic: Secondary | ICD-10-CM

## 2021-01-20 NOTE — Progress Notes (Signed)
Virtual Visit via Video Note   I connected with Bernard [name] on 01/20/21 at 11:00 AM EDT by a video enabled telemedicine application and verified that I am speaking with the correct person using two identifiers.   Location: Patient: Home Provider: Office   I discussed the limitations of evaluation and management by telemedicine and the availability of in person appointments. The patient expressed understanding and agreed to proceed.   THERAPIST PROGRESS NOTE   Session Time: 11:00 AM-11:25 AM   Participation Level: Active   Behavioral Response: CasualAlertAnxious   Type of Therapy: Individual Therapy   Treatment Goals addressed: Coping   Interventions: CBT and Solution Focused   Summary: Bernard [name] is a 45 y.o. male who presents with  Bipolar Disorder and GAD. The OPT therapist worked with the patient for his  ongoing OPT treatment. The OPT therapist utilized Motivational Interviewing to assist in creating therapeutic repore. The patient in the session was engaged and work in collaboration giving feedback about his triggers and symptoms . The patient in this session spoke about ongoing success in utilizing therapy taught skills and implementing in his life. The OPT therapist utilized Cognitive Behavioral Therapy through cognitive restructuring as well as worked with the patient in review of behaviors and interactions over the past few weeks. The OPT therapist worked with the patient about continuing to work on focusing his energy in areas he can have control.The OPT therapist for holistic care reviewed the patients Medication Therapy. The patient spoke about finding therapy techniques helpful with emotion control and problem solving and that he had continued to implementing these techniques over the past few weeks. The patient who had been preparing for Discharge verbalized readiness to move forward with Discharging due to his success and the OPT therapist agreed with  Discharge due to the patient meeting his treatment goal with consistency.   Suicidal/Homicidal: Nowithout intent/plan   Therapist Response: The OPT therapist worked with the patient for the patients scheduled session. The patient was engaged in his session and gave feedback in relation to triggers, symptoms, and behavior responses over the past few weeks. The OPT therapist worked with the patient utilizing an in session Cognitive Behavioral Therapy exercise. The patient spoke the impact of continuing to use therapy taught technique in his life . The patient noted, "  I felt like when we started I was being surrounded by problems and you came in with a bat and fended them off for me and then taught me how to fend them off for myself and today I feel equipt with the tools and confidence to move forward myself". The OPT therapist continued emotion control, time management, and positive thinking work with the patient in this session. The OPT therapist agreed for formal successful Discharge   Plan: Successful Discharge   Diagnosis:      Axis I: GAD and Bipolar Disorder                           Axis II: No diagnosis   I discussed the assessment and treatment plan with the patient. The patient was provided an opportunity to ask questions and all were answered. The patient agreed with the plan and demonstrated an understanding of the instructions.   The patient was advised to call back or seek an in-person evaluation if the symptoms worsen or if the condition fails to improve as anticipated.   I provided 25 minutes of non-face-to-face time during this  encounter.   Lennox Grumbles, LCSW   01/20/2021

## 2021-02-02 ENCOUNTER — Ambulatory Visit
Admission: RE | Admit: 2021-02-02 | Discharge: 2021-02-02 | Disposition: A | Discharge status: Home / Self Care | Payer: Managed Care, Other (non HMO) | Attending: Family Medicine | Admitting: Family Medicine

## 2021-02-02 ENCOUNTER — Other Ambulatory Visit: Payer: Self-pay

## 2021-02-02 ENCOUNTER — Ambulatory Visit (INDEPENDENT_AMBULATORY_CARE_PROVIDER_SITE_OTHER): Payer: Managed Care, Other (non HMO) | Admitting: Family Medicine

## 2021-02-02 ENCOUNTER — Ambulatory Visit
Admission: RE | Admit: 2021-02-02 | Discharge: 2021-02-02 | Disposition: A | Discharge status: Home / Self Care | Payer: Managed Care, Other (non HMO) | Source: Ambulatory Visit | Attending: Family Medicine | Admitting: Family Medicine

## 2021-02-02 ENCOUNTER — Encounter: Payer: Self-pay | Admitting: Family Medicine

## 2021-02-02 VITALS — BP 104/67 | HR 103 | Temp 97.9°F | Wt 196.0 lb

## 2021-02-02 DIAGNOSIS — M25552 Pain in left hip: Secondary | ICD-10-CM

## 2021-02-02 DIAGNOSIS — M25562 Pain in left knee: Secondary | ICD-10-CM | POA: Diagnosis not present

## 2021-02-02 NOTE — Progress Notes (Signed)
BP 104/67    Pulse (!) 103    Temp 97.9 F (36.6 C)    Wt 196 lb (88.9 kg)    SpO2 97%    BMI 28.20 kg/m    Subjective:    Patient ID: Bernard [name], male    DOB: 1975/11/28, 45 y.o.   MRN: [id number]  HPI: Bernard [name] is a 45 y.o. male  Chief Complaint  Patient presents with   Leg Pain    Patient states he has been having inner left thigh pain with certain movements for a year for example getting out the car.    Knee Pain    Patient has been having left knee pain off and on for about 3 months, has worsened to almost every other day    THIGH PAIN Duration: about a year Pain: yes Severity: mild to moderate  Quality:  sharp Location:  thigh, inner and front, L only Bilateral:  no Onset: sudden- getting progressively worse Frequency: abducting L hip, like getting out of a car Time of  day:   at random Sudden unintentional leg jerking:   no Paresthesias:   no Decreased sensation:  no Weakness:   yes Insomnia:   no Fatigue:   no Status: worse Treatments attempted: advil  Relevant past medical, surgical, family and social history reviewed and updated as indicated. Interim medical history since our last visit reviewed. Allergies and medications reviewed and updated.  Review of Systems  Constitutional: Negative.   Respiratory: Negative.    Cardiovascular: Negative.   Musculoskeletal:  Positive for arthralgias, gait problem and myalgias. Negative for back pain, joint swelling, neck pain and neck stiffness.  Skin: Negative.   Neurological:  Positive for weakness. Negative for dizziness, tremors, seizures, syncope, facial asymmetry, speech difficulty, light-headedness, numbness and headaches.  Psychiatric/Behavioral: Negative.     Per HPI unless specifically indicated above     Objective:    BP 104/67    Pulse (!) 103    Temp 97.9 F (36.6 C)    Wt 196 lb (88.9 kg)    SpO2 97%    BMI 28.20 kg/m   Wt Readings from Last 3 Encounters:  02/02/21 196 lb  (88.9 kg)  10/28/20 191 lb 9.6 oz (86.9 kg)  05/03/20 198 lb 6.4 oz (90 kg)    Physical Exam Vitals and nursing note reviewed.  Constitutional:      General: He is not in acute distress.    Appearance: Normal appearance. He is not ill-appearing, toxic-appearing or diaphoretic.  HENT:     Head: Normocephalic and atraumatic.     Right Ear: External ear normal.     Left Ear: External ear normal.     Nose: Nose normal.     Mouth/Throat:     Mouth: Mucous membranes are moist.     Pharynx: Oropharynx is clear.  Eyes:     General: No scleral icterus.       Right eye: No discharge.        Left eye: No discharge.     Extraocular Movements: Extraocular movements intact.     Conjunctiva/sclera: Conjunctivae normal.     Pupils: Pupils are equal, round, and reactive to light.  Cardiovascular:     Rate and Rhythm: Normal rate and regular rhythm.     Pulses: Normal pulses.     Heart sounds: Normal heart sounds. No murmur heard.   No friction rub. No gallop.  Pulmonary:     Effort: Pulmonary effort  is normal. No respiratory distress.     Breath sounds: Normal breath sounds. No stridor. No wheezing, rhonchi or rales.  Chest:     Chest wall: No tenderness.  Musculoskeletal:        General: Normal range of motion.     Cervical back: Normal range of motion and neck supple.  Skin:    General: Skin is warm and dry.     Capillary Refill: Capillary refill takes less than 2 seconds.     Coloration: Skin is not jaundiced or pale.     Findings: No bruising, erythema, lesion or rash.  Neurological:     General: No focal deficit present.     Mental Status: He is alert and oriented to person, place, and time. Mental status is at baseline.  Psychiatric:        Mood and Affect: Mood normal.        Behavior: Behavior normal.        Thought Content: Thought content normal.        Judgment: Judgment normal.    Results for orders placed or performed in visit on 10/28/20  Bayer DCA Hb A1c Waived   Result Value Ref Range   HB A1C (BAYER DCA - WAIVED) 6.4 (H) 4.8 - 5.6 %  CBC with Differential/Platelet  Result Value Ref Range   WBC 6.8 3.4 - 10.8 x10E3/uL   RBC 5.08 4.14 - 5.80 x10E6/uL   Hemoglobin 15.5 13.0 - 17.7 g/dL   Hematocrit 45.3 37.5 - 51.0 %   MCV 89 79 - 97 fL   MCH 30.5 26.6 - 33.0 pg   MCHC 34.2 31.5 - 35.7 g/dL   RDW 12.2 11.6 - 15.4 %   Platelets 225 150 - 450 x10E3/uL   Neutrophils 62 Not Estab. %   Lymphs 25 Not Estab. %   Monocytes 7 Not Estab. %   Eos 5 Not Estab. %   Basos 1 Not Estab. %   Neutrophils Absolute 4.2 1.4 - 7.0 x10E3/uL   Lymphocytes Absolute 1.7 0.7 - 3.1 x10E3/uL   Monocytes Absolute 0.5 0.1 - 0.9 x10E3/uL   EOS (ABSOLUTE) 0.3 0.0 - 0.4 x10E3/uL   Basophils Absolute 0.1 0.0 - 0.2 x10E3/uL   Immature Granulocytes 0 Not Estab. %   Immature Grans (Abs) 0.0 0.0 - 0.1 x10E3/uL  Lipid Panel w/o Chol/HDL Ratio  Result Value Ref Range   Cholesterol, Total 123 100 - 199 mg/dL   Triglycerides 47 0 - 149 mg/dL   HDL 48 >39 mg/dL   VLDL Cholesterol Cal 11 5 - 40 mg/dL   LDL Chol Calc (NIH) 64 0 - 99 mg/dL  Comprehensive metabolic panel  Result Value Ref Range   Glucose 91 65 - 99 mg/dL   BUN 7 6 - 24 mg/dL   Creatinine, Ser 1.10 0.76 - 1.27 mg/dL   eGFR 85 >59 mL/min/1.73   BUN/Creatinine Ratio 6 (L) 9 - 20   Sodium 143 134 - 144 mmol/L   Potassium 4.8 3.5 - 5.2 mmol/L   Chloride 103 96 - 106 mmol/L   CO2 23 20 - 29 mmol/L   Calcium 9.6 8.7 - 10.2 mg/dL   Total Protein 6.4 6.0 - 8.5 g/dL   Albumin 4.9 4.0 - 5.0 g/dL   Globulin, Total 1.5 1.5 - 4.5 g/dL   Albumin/Globulin Ratio 3.3 (H) 1.2 - 2.2   Bilirubin Total 0.5 0.0 - 1.2 mg/dL   Alkaline Phosphatase 62 44 - 121 IU/L   AST  27 0 - 40 IU/L   ALT 30 0 - 44 IU/L  Lamotrigine level  Result Value Ref Range   Lamotrigine Lvl 1.8 (L) 2.0 - 20.0 ug/mL      Assessment & Plan:   Problem List Items Addressed This Visit   None Visit Diagnoses     Left hip pain    -  Primary   Will  check x-rays and start stretches as needed. Await results. Treat as needed.    Relevant Orders   DG Hip Unilat W OR W/O Pelvis 2-3 Views Left   DG Lumbar Spine Complete   Acute pain of left knee       Will check x-rays and start stretches as needed. Await results. Treat as needed.    Relevant Orders   DG Knee Complete 4 Views Left        Follow up plan: Return As scheduled.

## 2021-02-07 ENCOUNTER — Encounter: Payer: Self-pay | Admitting: Family Medicine

## 2021-02-13 ENCOUNTER — Other Ambulatory Visit: Payer: Self-pay | Admitting: Psychiatry

## 2021-02-13 DIAGNOSIS — E785 Hyperlipidemia, unspecified: Secondary | ICD-10-CM | POA: Diagnosis not present

## 2021-02-13 DIAGNOSIS — E1069 Type 1 diabetes mellitus with other specified complication: Secondary | ICD-10-CM | POA: Diagnosis not present

## 2021-02-13 DIAGNOSIS — F3181 Bipolar II disorder: Secondary | ICD-10-CM

## 2021-02-13 DIAGNOSIS — I152 Hypertension secondary to endocrine disorders: Secondary | ICD-10-CM | POA: Diagnosis not present

## 2021-02-13 DIAGNOSIS — F411 Generalized anxiety disorder: Secondary | ICD-10-CM

## 2021-03-01 ENCOUNTER — Ambulatory Visit: Payer: Self-pay

## 2021-03-01 ENCOUNTER — Ambulatory Visit: Admit: 2021-03-01 | Payer: Managed Care, Other (non HMO)

## 2021-03-01 ENCOUNTER — Ambulatory Visit
Admission: EM | Admit: 2021-03-01 | Discharge: 2021-03-01 | Disposition: A | Discharge status: Home / Self Care | Payer: BC Managed Care – PPO | Attending: Emergency Medicine | Admitting: Emergency Medicine

## 2021-03-01 ENCOUNTER — Other Ambulatory Visit: Payer: Self-pay

## 2021-03-01 DIAGNOSIS — H60502 Unspecified acute noninfective otitis externa, left ear: Secondary | ICD-10-CM | POA: Diagnosis not present

## 2021-03-01 MED ORDER — OFLOXACIN 0.3 % OT SOLN: 5.0000 [drp] | Freq: Two times a day (BID) | OTIC | 0 refills | Status: DC

## 2021-03-01 MED ORDER — MECLIZINE HCL 12.5 MG PO TABS: 12.5000 mg | ORAL_TABLET | Freq: Three times a day (TID) | ORAL | 0 refills | Status: DC | PRN

## 2021-03-01 NOTE — Discharge Instructions (Addendum)
Take ofloxacin 5 drops in the left ear twice a day for the next 7 days  Contact ear nose and throat for reevaluation in 1 to 2 weeks, concern for possible cholesteatoma   May use over-the-counter ibuprofen 800 mg every 8 hours,  or Tylenol 1000 mg every 6 hours l to help manage pain  The dizziness you are experiencing is most likely related to your ear troubles  Take meclizine three times a day as needed

## 2021-03-01 NOTE — ED Triage Notes (Signed)
Patient presents to Urgent Care with complaints of left ear pain, dizziness, and joint pain since yesterday. Not taking medications for pain.   Denies fever or drainage.

## 2021-03-01 NOTE — Telephone Encounter (Signed)
Pt is really dizzy/ has vertigo and a sore ear. Wants appt today   Best contact: [phone]     Chief Complaint: Left ear pain and dizziness Symptoms: Pain and dizziness Frequency: Started yesterday evening Pertinent Negatives: Patient denies runny nose, fever Disposition: [] ED /[] Urgent Care (no appt availability in office) / [] Appointment(In office/virtual)/ []  [facility]/ [] Home Care/ [] Refused Recommended Disposition /[] [facility]/ []  Follow-up with PCP Additional Notes: Requests appointment today because he is afraid to drive and has someone that can bring him today. Practice currently closed for lunch. Please advise pt.  Answer Assessment - Initial Assessment Questions 1. LOCATION: "Which ear is involved?"     Left ear  2. ONSET: "When did the ear start hurting"      Yesterday 3. SEVERITY: "How bad is the pain?"  (Scale 1-10; mild, moderate or severe)   - MILD (1-3): doesn't interfere with normal activities    - MODERATE (4-7): interferes with normal activities or awakens from sleep    - SEVERE (8-10): excruciating pain, unable to do any normal activities      Moderate 4. URI SYMPTOMS: "Do you have a runny nose or cough?"     No 5. FEVER: "Do you have a fever?" If Yes, ask: "What is your temperature, how was it measured, and when did it start?"     No 6. CAUSE: "Have you been swimming recently?", "How often do you use Q-TIPS?", "Have you had any recent air travel or scuba diving?"     No 7. OTHER SYMPTOMS: "Do you have any other symptoms?" (e.g., headache, stiff neck, dizziness, vomiting, runny nose, decreased hearing)     Dizziness 8. PREGNANCY: "Is there any chance you are pregnant?" "When was your last menstrual period?"     N/a  Protocols used: Bethann Punches

## 2021-03-01 NOTE — ED Provider Notes (Signed)
[facility]    CSN: [id number] Arrival date & time: 03/01/21  1430      History   Chief Complaint Chief Complaint  Patient presents with   Otalgia    HPI Becker Marquez Ceesay is a 46 y.o. male.   Presents with dizziness, body aches, nausea and left-sided ear pain for 1 day.  Pain is worsened by touching ear.  Dizziness is described as he is moving and feeling off balance when walking, occurring intermittently.  Endorses generalized weakness.  Denies lightheadedness, syncope, URI symptoms, fevers.  Has not attempted treatment of symptoms.  Tolerating food and liquids.  Symptoms have not occurred before.  No pertinent medical history.    Past Medical History:  Diagnosis Date   Anxiety    Depression    Esophageal stricture    Hyperlipidemia    Hypertension    Weight loss     Patient Active Problem List   Diagnosis Date Noted   High risk medication use 01/19/2021   RLS (restless legs syndrome) 05/06/2020   Seizure-like activity ([place]) 11/26/2019   Altered mental status 11/17/2019   Drug reaction 09/28/2019   Tobacco use disorder 01/07/2019   Bipolar disorder, in full remission, most recent episode hypomanic ([place]) 01/07/2019   Bipolar 2 disorder ([place]) 10/09/2018   GAD (generalized anxiety disorder) 10/09/2018   Diabetic retinopathy associated with type 1 diabetes mellitus ([place]) 09/15/2018   Dupuytren's contracture of right hand 04/17/2018   Shoulder pain, left 04/17/2018   Hyperlipidemia due to type 1 diabetes mellitus ([place]) 10/29/2016   Gastroesophageal reflux disease without esophagitis 06/04/2016   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  Authorizing Provider  ARIPiprazole (ABILIFY) 2 MG tablet TAKE ONE (1) TABLET BY MOUTH ONCE DAILY 02/13/21   Ursula Alert, MD  atorvastatin (LIPITOR) 40 MG tablet Take 1 tablet (40 mg total) by mouth daily. 10/28/20   Johnson, Megan P, DO  busPIRone (BUSPAR) 10 MG tablet TAKE (1) TABLET BY MOUTH THREE TIMES A DAY 01/19/21   Ursula Alert, MD  Cetirizine HCl 10 MG CAPS Take 10 mg by mouth daily.    [provider]  Continuous Blood Gluc Sensor (DEXCOM G6 SENSOR) MISC SMARTSIG:1 Each Topical Every 10 Days 12/04/19   [provider]  Continuous Blood Gluc Transmit (DEXCOM G6 TRANSMITTER) MISC USE 1 EACH EVERY 3 (THREE) MONTHS 07/23/19   [provider]  famotidine (PEPCID) 20 MG tablet Take 20 mg by mouth 2 (two) times daily. 12/26/20   [provider]  insulin aspart (NOVOLOG) 100 UNIT/ML injection INJECT UP TO 100 UNITS VIA PUMP EVERY DAY 02/02/21   [provider]  Insulin Disposable Pump (OMNIPOD 5 G6 INTRO, GEN 5,) KIT Inject into the skin. 01/16/21   [provider]  insulin glargine (LANTUS) 100 UNIT/ML Solostar Pen 50 units daily when off pump 07/19/17   [provider]  Insulin Infusion Pump Supplies (AUTOSOFT XC INFUSION SET) MISC T slim infusion sets.  Change infusion set every 2-3 days 05/26/20   [provider]  Insulin Infusion Pump Supplies (T:SLIM X2 3ML CARTRIDGE) MISC T slim cartridges:  Change  cartridge every 2-3 days 05/26/20   [provider]  Insulin Pen Needle 29G X 12.7MM MISC 1 each by Does not apply route 3 (three) times daily. 08/12/14   Johnson, Megan P, DO  lamoTRIgine (LAMICTAL) 25 MG tablet TAKE (3) TABLETS BY MOUTH EVERY DAY. 12/19/20   Ursula Alert, MD  Lancets (ONETOUCH DELICA PLUS [id number]) [place]  11/03/18   [provider]  lisinopril (ZESTRIL) 10 MG tablet Take 1 tablet (10 mg total) by mouth daily. 10/28/20   Johnson, Megan P, DO  ondansetron (ZOFRAN ODT) 4 MG disintegrating  tablet Take 1 tablet (4 mg total) by mouth every 8 (eight) hours as needed for nausea or vomiting. 05/03/20   Wynetta Emery, Megan P, DO  pantoprazole (PROTONIX) 40 MG tablet TAKE (1) TABLET BY MOUTH EVERY DAY 10/28/20   Johnson, Megan P, DO  ranitidine (ZANTAC) 150 MG capsule Take 150 mg by mouth daily.    [provider]  rOPINIRole (REQUIP) 0.5 MG tablet Take 1 tablet (0.5 mg total) by mouth at bedtime. 10/28/20   Park Liter P, DO  simethicone (GAS-X) 80 MG chewable tablet Chew 1 tablet (80 mg total) by mouth every 6 (six) hours as needed for flatulence (or cramping). 05/03/20   Valerie Roys, DO    Family History Family History  Problem Relation Age of Onset   Cancer Mother    Anxiety disorder Mother    Depression Mother    Cancer Father    Anxiety disorder Brother    Depression Brother     Social History Social History   Tobacco Use   Smoking status: Every Day    Packs/day: 0.50    Types: Cigarettes    Start date: 01/22/2017   Smokeless tobacco: Never   Tobacco comments:    1/2 of cigarettes 6 per day- 01/19/2021  Vaping Use   Vaping Use: Never used  Substance Use Topics   Alcohol use: Yes    Alcohol/week: 2.0 standard drinks    Types: 2 Cans of beer per week    Comment: on occasion   Drug use: No     Allergies   Chantix [varenicline]   Review of Systems Review of Systems  Constitutional: Negative.   HENT:  Positive for ear pain. Negative for congestion, dental problem, drooling, ear discharge, facial swelling, hearing loss, mouth sores, nosebleeds, postnasal drip, rhinorrhea, sinus pressure, sinus pain, sneezing, sore throat, tinnitus, trouble swallowing and voice change.   Respiratory: Negative.    Cardiovascular: Negative.   Skin: Negative.   Neurological:  Positive for dizziness and headaches. Negative for tremors, seizures, syncope, facial asymmetry, speech difficulty, weakness, light-headedness and numbness.    Physical Exam Triage Vital  Signs ED Triage Vitals  Enc Vitals Group     BP 03/01/21 1454 115/81     Pulse Rate 03/01/21 1454 91     Resp 03/01/21 1454 16     Temp 03/01/21 1454 98.6 F (37 C)     Temp Source 03/01/21 1454 Oral     SpO2 03/01/21 1454 100 %     Weight --      Height --      Head Circumference --      Peak Flow --      Pain Score 03/01/21 1453 1     Pain Loc --      Pain Edu? --      Excl. in [place]? --    No data found.  Updated Vital Signs BP 115/81 (  BP Location: Left Arm)    Pulse 91    Temp 98.6 F (37 C) (Oral)    Resp 16    SpO2 100%   Visual Acuity Right Eye Distance:   Left Eye Distance:   Bilateral Distance:    Right Eye Near:   Left Eye Near:    Bilateral Near:     Physical Exam Constitutional:      Appearance: Normal appearance.  HENT:     Head: Normocephalic.     Right Ear: Hearing, tympanic membrane, ear canal and external ear normal.     Ears:     Comments: Tenderness and brown to [place] drainage noted on the left Ear canal, tympanic membrane normal Eyes:     Extraocular Movements: Extraocular movements intact.  Pulmonary:     Effort: Pulmonary effort is normal.  Skin:    General: Skin is warm and dry.  Neurological:     Mental Status: He is alert and oriented to person, place, and time. Mental status is at baseline.  Psychiatric:        Mood and Affect: Mood normal.        Behavior: Behavior normal.     UC Treatments / Results  Labs (all labs ordered are listed, but only abnormal results are displayed) Labs Reviewed - No data to display  EKG   Radiology No results found.  Procedures Procedures (including critical care time)  Medications Ordered in UC Medications - No data to display  Initial Impression / Assessment and Plan / UC Course  I have reviewed the triage vital signs and the nursing notes.  Pertinent labs & imaging results that were available during my care of the patient were reviewed by me and considered in my medical decision making  (see chart for details).  Acute otitis externa left ear  Ofloxacin otic solution 7-day course prescribed, walking referral given for [facility] for possible cholesteatoma visualized by second provider, advised discontinuation of ear cleaning and insertion of objects into the canal, over-the-counter medications for pain management, prescribed meclizine for management of dizziness with urgent care follow-up as needed Final Clinical Impressions(s) / UC Diagnoses   Final diagnoses:  None   Discharge Instructions   None    ED Prescriptions   None    PDMP not reviewed this encounter.   Hans Eden, NP 03/01/21 1659

## 2021-03-01 NOTE — Telephone Encounter (Signed)
Spoke with pt to let him know that we only have appts available tomorrow. Pt states he will call back because he will most likely go somewhere where he can be seen sooner.

## 2021-03-20 ENCOUNTER — Other Ambulatory Visit: Payer: Self-pay | Admitting: Psychiatry

## 2021-03-20 DIAGNOSIS — F3172 Bipolar disorder, in full remission, most recent episode hypomanic: Secondary | ICD-10-CM

## 2021-03-27 ENCOUNTER — Other Ambulatory Visit: Payer: Self-pay | Admitting: Family Medicine

## 2021-03-28 NOTE — Telephone Encounter (Signed)
Requested medication (s) are due for refill today: yes  Requested medication (s) are on the active medication list: yes    Last refill: 01/19/21  #180  0 refills  Future visit scheduled yes 04/27/21  Notes to clinic:historical provider, please review  Requested Prescriptions  Pending Prescriptions Disp Refills   famotidine (PEPCID) 20 MG tablet [Pharmacy Med Name: FAMOTIDINE 20 MG TAB] 180 tablet     Sig: TAKE (1) TABLET BY MOUTH TWICE DAILY     Gastroenterology:  H2 Antagonists Passed - 03/27/2021  8:25 AM      Passed - Valid encounter within last 12 months    Recent Outpatient Visits           1 month ago Left hip pain   [place], Megan P, DO   5 months ago Type 1 diabetes mellitus without complication ([place])   [place], Megan P, DO   10 months ago Routine general medical examination at a health care facility   [facility], [place], DO   1 year ago Upper respiratory tract infection, unspecified type   [place], [place], DO   1 year ago Cough   [place], Barb Merino, DO       Future Appointments             In 1 month Johnson, Barb Merino, DO [employer]

## 2021-04-17 ENCOUNTER — Other Ambulatory Visit: Payer: Self-pay | Admitting: Psychiatry

## 2021-04-17 DIAGNOSIS — F3181 Bipolar II disorder: Secondary | ICD-10-CM

## 2021-04-17 DIAGNOSIS — F411 Generalized anxiety disorder: Secondary | ICD-10-CM

## 2021-04-21 ENCOUNTER — Telehealth: Payer: Self-pay | Admitting: Psychiatry

## 2021-04-21 NOTE — Telephone Encounter (Signed)
Patient called in requesting an itemized bill. Info given for billing. Patient emailed bill he received reviewed ?

## 2021-04-27 ENCOUNTER — Other Ambulatory Visit: Payer: Self-pay

## 2021-04-27 ENCOUNTER — Ambulatory Visit (INDEPENDENT_AMBULATORY_CARE_PROVIDER_SITE_OTHER): Payer: BC Managed Care – PPO | Admitting: Family Medicine

## 2021-04-27 ENCOUNTER — Ambulatory Visit: Payer: Self-pay | Admitting: Psychiatry

## 2021-04-27 ENCOUNTER — Encounter: Payer: Self-pay | Admitting: Family Medicine

## 2021-04-27 VITALS — BP 99/64 | HR 71 | Temp 98.2°F | Wt 191.4 lb

## 2021-04-27 DIAGNOSIS — K219 Gastro-esophageal reflux disease without esophagitis: Secondary | ICD-10-CM

## 2021-04-27 DIAGNOSIS — E7800 Pure hypercholesterolemia, unspecified: Secondary | ICD-10-CM | POA: Diagnosis not present

## 2021-04-27 DIAGNOSIS — Z79899 Other long term (current) drug therapy: Secondary | ICD-10-CM

## 2021-04-27 DIAGNOSIS — E1069 Type 1 diabetes mellitus with other specified complication: Secondary | ICD-10-CM | POA: Diagnosis not present

## 2021-04-27 DIAGNOSIS — R109 Unspecified abdominal pain: Secondary | ICD-10-CM

## 2021-04-27 DIAGNOSIS — Z125 Encounter for screening for malignant neoplasm of prostate: Secondary | ICD-10-CM

## 2021-04-27 DIAGNOSIS — F3181 Bipolar II disorder: Secondary | ICD-10-CM

## 2021-04-27 DIAGNOSIS — G2581 Restless legs syndrome: Secondary | ICD-10-CM

## 2021-04-27 DIAGNOSIS — E109 Type 1 diabetes mellitus without complications: Secondary | ICD-10-CM | POA: Diagnosis not present

## 2021-04-27 DIAGNOSIS — F339 Major depressive disorder, recurrent, unspecified: Secondary | ICD-10-CM

## 2021-04-27 DIAGNOSIS — I10 Essential (primary) hypertension: Secondary | ICD-10-CM

## 2021-04-27 DIAGNOSIS — E785 Hyperlipidemia, unspecified: Secondary | ICD-10-CM | POA: Diagnosis not present

## 2021-04-27 DIAGNOSIS — F411 Generalized anxiety disorder: Secondary | ICD-10-CM

## 2021-04-27 DIAGNOSIS — R569 Unspecified convulsions: Secondary | ICD-10-CM

## 2021-04-27 DIAGNOSIS — F3172 Bipolar disorder, in full remission, most recent episode hypomanic: Secondary | ICD-10-CM

## 2021-04-27 DIAGNOSIS — E11319 Type 2 diabetes mellitus with unspecified diabetic retinopathy without macular edema: Secondary | ICD-10-CM

## 2021-04-27 DIAGNOSIS — E10319 Type 1 diabetes mellitus with unspecified diabetic retinopathy without macular edema: Secondary | ICD-10-CM

## 2021-04-27 DIAGNOSIS — Z794 Long term (current) use of insulin: Secondary | ICD-10-CM

## 2021-04-27 LAB — URINALYSIS, ROUTINE W REFLEX MICROSCOPIC
Bilirubin, UA: NEGATIVE
Glucose, UA: NEGATIVE
Ketones, UA: NEGATIVE
Leukocytes,UA: NEGATIVE
Nitrite, UA: NEGATIVE
Protein,UA: NEGATIVE
RBC, UA: NEGATIVE
Specific Gravity, UA: 1.01 (ref 1.005–1.030)
Urobilinogen, Ur: 0.2 mg/dL (ref 0.2–1.0)
pH, UA: 6.5 (ref 5.0–7.5)

## 2021-04-27 LAB — BAYER DCA HB A1C WAIVED: HB A1C (BAYER DCA - WAIVED): 6.6 % — ABNORMAL HIGH (ref 4.8–5.6)

## 2021-04-27 LAB — MICROALBUMIN, URINE WAIVED
Creatinine, Urine Waived: 50 mg/dL (ref 10–300)
Microalb, Ur Waived: 10 mg/L (ref 0–19)

## 2021-04-27 MED ORDER — NICOTINE 14 MG/24HR TD PT24: 14.0000 mg | MEDICATED_PATCH | Freq: Every day | TRANSDERMAL | 0 refills | Status: DC

## 2021-04-27 MED ORDER — LAMOTRIGINE 25 MG PO TABS: ORAL_TABLET | ORAL | 2 refills | Status: DC

## 2021-04-27 MED ORDER — ROPINIROLE HCL 0.5 MG PO TABS: 0.5000 mg | ORAL_TABLET | Freq: Every day | ORAL | 1 refills | Status: DC

## 2021-04-27 MED ORDER — ARIPIPRAZOLE 2 MG PO TABS: ORAL_TABLET | ORAL | 1 refills | Status: DC

## 2021-04-27 MED ORDER — BUSPIRONE HCL 10 MG PO TABS: ORAL_TABLET | ORAL | 2 refills | Status: DC

## 2021-04-27 MED ORDER — NICOTINE 7 MG/24HR TD PT24: 7.0000 mg | MEDICATED_PATCH | Freq: Every day | TRANSDERMAL | 0 refills | Status: DC

## 2021-04-27 MED ORDER — ONDANSETRON 4 MG PO TBDP: 4.0000 mg | ORAL_TABLET | Freq: Three times a day (TID) | ORAL | 1 refills | Status: DC | PRN

## 2021-04-27 MED ORDER — LISINOPRIL 10 MG PO TABS: 10.0000 mg | ORAL_TABLET | Freq: Every day | ORAL | 1 refills | Status: DC

## 2021-04-27 MED ORDER — SIMETHICONE 80 MG PO CHEW: 80.0000 mg | CHEWABLE_TABLET | Freq: Four times a day (QID) | ORAL | 3 refills | Status: AC | PRN

## 2021-04-27 MED ORDER — PANTOPRAZOLE SODIUM 40 MG PO TBEC: DELAYED_RELEASE_TABLET | ORAL | 1 refills | Status: DC

## 2021-04-27 MED ORDER — ATORVASTATIN CALCIUM 40 MG PO TABS: 40.0000 mg | ORAL_TABLET | Freq: Every day | ORAL | 1 refills | Status: DC

## 2021-04-27 NOTE — Assessment & Plan Note (Signed)
Under good control on current regimen. Continue current regimen. Continue to monitor. Call with any concerns. Refills given. Labs drawn today.   

## 2021-04-27 NOTE — Assessment & Plan Note (Signed)
Doing well. No seizures. Checking labs today. Await results. Continue to monitor.  ?

## 2021-04-27 NOTE — Assessment & Plan Note (Signed)
Doing well with A1c of 6.6. Continue current regimen. Continue to follow with endocrinology. Call with any concerns.  ?

## 2021-04-27 NOTE — Progress Notes (Signed)
? ?BP 99/64   Pulse 71   Temp 98.2 ?F (36.8 ?C)   Wt 191 lb 6.4 oz (86.8 kg)   SpO2 97%   BMI 27.54 kg/m?   ? ?Subjective:  ? ? Patient ID: Bernard [name], male    DOB: 20-Nov-1975, 46 y.o.   MRN: [id number] ? ?HPI: ?Bernard [name] is a 46 y.o. male ? ?Chief Complaint  ?Patient presents with  ? Diabetes  ? Depression  ? Hyperlipidemia  ? Hypertension  ? ?DIABETES ?Hypoglycemic episodes:no ?Polydipsia/polyuria: no ?Visual disturbance: no ?Chest pain: no ?Paresthesias: no ?Glucose Monitoring: yes ? Accucheck frequency: continuous ?Taking Insulin?: yes ?Blood Pressure Monitoring: not checking ?Retinal Examination: Up to Date ?Foot Exam: Up to Date ?Diabetic Education: Completed ?Pneumovax: Up to Date ?Influenza: Up to Date ?Aspirin: no ? ?HYPERTENSION / HYPERLIPIDEMIA ?Satisfied with current treatment? yes ?Duration of hypertension: chronic ?BP monitoring frequency: not checking ?BP medication side effects: no ?Past BP meds: lisinopril ?Duration of hyperlipidemia: chronic ?Cholesterol medication side effects: yes ?Cholesterol supplements: none ?Past cholesterol medications: atorvastatin ?Medication compliance: excellent compliance ?Aspirin: no ?Recent stressors: no ?Recurrent headaches: no ?Visual changes: no ?Palpitations: no ?Dyspnea: no ?Chest pain: no ?Lower extremity edema: no ?Dizzy/lightheaded: no ? ?SMOKING CESSATION ?Smoking Status: every day smoker ?Smoking Amount: less than a pack a day ?Smoking Onset: 10 years ago ?Smoking Quit Date: not set ?Smoking triggers: stress ?Type of tobacco use: cigarettes ?Children in the house: yes ?Other household members who smoke: no ?Treatments attempted: chantix ?Pneumovax: up to date ? ?EAG CLOGGED ?Duration: months ?Involved ear(s): no left ?Sensation of feeling clogged/plugged: yes ?Decreased/muffled hearing:no ?Ear pain: yes ?Fever: no ?Otorrhea: no ?Hearing loss: no ?Upper respiratory infection symptoms: no ?Using Q-Tips: no ?Status: stable ?History  of cerumenosis: yes ?Treatments attempted: none ? ?DEPRESSION ?Mood status: controlled ?Satisfied with current treatment?: no ?Symptom severity: mild  ?Duration of current treatment : chronic ?Side effects: no ?Medication compliance: excellent compliance ?Psychotherapy/counseling: no  ?Depressed mood: no ?Anxious mood: no ?Anhedonia: no ?Significant weight loss or gain: no ?Insomnia: no  ?Fatigue: yes ?Feelings of worthlessness or guilt: no ?Impaired concentration/indecisiveness: no ?Suicidal ideations: no ?Hopelessness: no ?Crying spells: no ? ?  04/27/2021  ?  9:20 AM 02/02/2021  ?  1:25 PM 12/22/2020  ?  9:38 AM 10/28/2020  ? 10:32 AM 06/30/2020  ?  1:32 PM  ?Depression screen PHQ 2/9  ?Decreased Interest 0 0  0   ?Down, Depressed, Hopeless 1 1  1    ?PHQ - 2 Score 1 1  1    ?Altered sleeping 3 0  3   ?Tired, decreased energy 2 2  0   ?Change in appetite 0 0  0   ?Feeling bad or failure about yourself  0 0  0   ?Trouble concentrating 0 0  0   ?Moving slowly or fidgety/restless 0 0  0   ?Suicidal thoughts 0 0  0   ?PHQ-9 Score 6 3  4    ?Difficult doing work/chores    Not difficult at all   ?  ? Information is confidential and restricted. Go to Review Flowsheets to unlock data.  ? ? ? ?Relevant past medical, surgical, family and social history reviewed and updated as indicated. Interim medical history since our last visit reviewed. ?Allergies and medications reviewed and updated. ? ?Review of Systems  ?Constitutional: Negative.   ?Respiratory: Negative.    ?Cardiovascular: Negative.   ?Gastrointestinal: Negative.   ?Musculoskeletal: Negative.   ?Neurological: Negative.   ?Psychiatric/Behavioral: Negative.    ? ?  Per HPI unless specifically indicated above ? ?   ?Objective:  ?  ?BP 99/64   Pulse 71   Temp 98.2 ?F (36.8 ?C)   Wt 191 lb 6.4 oz (86.8 kg)   SpO2 97%   BMI 27.54 kg/m?   ?Wt Readings from Last 3 Encounters:  ?04/27/21 191 lb 6.4 oz (86.8 kg)  ?02/02/21 196 lb (88.9 kg)  ?10/28/20 191 lb 9.6 oz (86.9 kg)  ?   ?Physical Exam ?Vitals and nursing note reviewed.  ?Constitutional:   ?   General: He is not in acute distress. ?   Appearance: Normal appearance. He is not ill-appearing, toxic-appearing or diaphoretic.  ?HENT:  ?   Head: Normocephalic and atraumatic.  ?   Right Ear: External ear normal.  ?   Left Ear: External ear normal.  ?   Nose: Nose normal.  ?   Mouth/Throat:  ?   Mouth: Mucous membranes are moist.  ?   Pharynx: Oropharynx is clear.  ?Eyes:  ?   General: No scleral icterus.    ?   Right eye: No discharge.     ?   Left eye: No discharge.  ?   Extraocular Movements: Extraocular movements intact.  ?   Conjunctiva/sclera: Conjunctivae normal.  ?   Pupils: Pupils are equal, round, and reactive to light.  ?Cardiovascular:  ?   Rate and Rhythm: Normal rate and regular rhythm.  ?   Pulses: Normal pulses.  ?   Heart sounds: Normal heart sounds. No murmur heard. ?  No friction rub. No gallop.  ?Pulmonary:  ?   Effort: Pulmonary effort is normal. No respiratory distress.  ?   Breath sounds: Normal breath sounds. No stridor. No wheezing, rhonchi or rales.  ?Chest:  ?   Chest wall: No tenderness.  ?Musculoskeletal:     ?   General: Normal range of motion.  ?   Cervical back: Normal range of motion and neck supple.  ?Skin: ?   General: Skin is warm and dry.  ?   Capillary Refill: Capillary refill takes less than 2 seconds.  ?   Coloration: Skin is not jaundiced or pale.  ?   Findings: No bruising, erythema, lesion or rash.  ?Neurological:  ?   General: No focal deficit present.  ?   Mental Status: He is alert and oriented to person, place, and time. Mental status is at baseline.  ?Psychiatric:     ?   Mood and Affect: Mood normal.     ?   Behavior: Behavior normal.     ?   Thought Content: Thought content normal.     ?   Judgment: Judgment normal.  ? ? ?Results for orders placed or performed in visit on 04/27/21  ?Urinalysis, Routine w reflex microscopic  ?Result Value Ref Range  ? Specific Gravity, UA 1.010 1.005 - 1.030   ? pH, UA 6.5 5.0 - 7.5  ? Color, UA Yellow Yellow  ? Appearance Ur Clear Clear  ? Leukocytes,UA Negative Negative  ? Protein,UA Negative Negative/Trace  ? Glucose, UA Negative Negative  ? Ketones, UA Negative Negative  ? RBC, UA Negative Negative  ? Bilirubin, UA Negative Negative  ? Urobilinogen, Ur 0.2 0.2 - 1.0 mg/dL  ? Nitrite, UA Negative Negative  ?Microalbumin, Urine Waived  ?Result Value Ref Range  ? Microalb, Ur Waived 10 0 - 19 mg/L  ? Creatinine, Urine Waived 50 10 - 300 mg/dL  ? Microalb/Creat Ratio 30-300 (H) <30  mg/g  ?Bayer DCA Hb A1c Waived  ?Result Value Ref Range  ? HB A1C (BAYER DCA - WAIVED) 6.6 (H) 4.8 - 5.6 %  ? ?   ?Assessment & Plan:  ? ?Problem List Items Addressed This Visit   ? ?  ? Cardiovascular and Mediastinum  ? Hypertension  ?  Running low here, but doing better at home. Will continue to monitor. Recheck in 2-3 months, if still running low, we'll cut him in 1/2. ?  ?  ? Relevant Medications  ? lisinopril (ZESTRIL) 10 MG tablet  ? atorvastatin (LIPITOR) 40 MG tablet  ?  ? Digestive  ? Gastroesophageal reflux disease without esophagitis  ?  Under good control on current regimen. Continue current regimen. Continue to monitor. Call with any concerns. Refills given. Labs drawn today. ? ?  ?  ? Relevant Medications  ? simethicone (GAS-X) 80 MG chewable tablet  ? pantoprazole (PROTONIX) 40 MG tablet  ? ondansetron (ZOFRAN ODT) 4 MG disintegrating tablet  ? Other Relevant Orders  ? Comprehensive metabolic panel  ? CBC with Differential/Platelet  ?  ? Endocrine  ? Type 2 diabetes mellitus with ophthalmic complication (HCC)  ?  Doing well with A1c of 6.6. Continue current regimen. Continue to follow with endocrinology. Call with any concerns.  ?  ?  ? Relevant Medications  ? lisinopril (ZESTRIL) 10 MG tablet  ? atorvastatin (LIPITOR) 40 MG tablet  ? 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  Under good control on current regimen. Continue current regimen. Continue to monitor.  Call with any concerns. Refills given. Labs drawn today. ? ?  ?  ? Relevant Medications  ? lisinopril (ZESTRIL) 10 MG tablet  ? atorvastatin (LIPITOR) 40 MG tablet  ? Other Relevant Orders  ? Comprehensive metab

## 2021-04-27 NOTE — Assessment & Plan Note (Signed)
Running low here, but doing better at home. Will continue to monitor. Recheck in 2-3 months, if still running low, we'll cut him in 1/2. ?

## 2021-04-28 ENCOUNTER — Encounter: Payer: Self-pay | Admitting: Family Medicine

## 2021-04-28 LAB — COMPREHENSIVE METABOLIC PANEL
ALT: 30 IU/L (ref 0–44)
AST: 23 IU/L (ref 0–40)
Albumin/Globulin Ratio: 3.7 — ABNORMAL HIGH (ref 1.2–2.2)
Albumin: 4.8 g/dL (ref 4.0–5.0)
Alkaline Phosphatase: 68 IU/L (ref 44–121)
BUN/Creatinine Ratio: 12 (ref 9–20)
BUN: 12 mg/dL (ref 6–24)
Bilirubin Total: 0.3 mg/dL (ref 0.0–1.2)
CO2: 24 mmol/L (ref 20–29)
Calcium: 9.3 mg/dL (ref 8.7–10.2)
Chloride: 103 mmol/L (ref 96–106)
Creatinine, Ser: 0.98 mg/dL (ref 0.76–1.27)
Globulin, Total: 1.3 g/dL — ABNORMAL LOW (ref 1.5–4.5)
Glucose: 165 mg/dL — ABNORMAL HIGH (ref 70–99)
Potassium: 4.4 mmol/L (ref 3.5–5.2)
Sodium: 141 mmol/L (ref 134–144)
Total Protein: 6.1 g/dL (ref 6.0–8.5)
eGFR: 97 mL/min/{1.73_m2} (ref >59)

## 2021-04-28 LAB — CBC WITH DIFFERENTIAL/PLATELET
Basophils Absolute: 0.1 10*3/uL (ref 0.0–0.2)
Basos: 1 %
EOS (ABSOLUTE): 0.3 10*3/uL (ref 0.0–0.4)
Eos: 5 %
Hematocrit: 44.4 % (ref 37.5–51.0)
Hemoglobin: 15.7 g/dL (ref 13.0–17.7)
Immature Grans (Abs): 0 10*3/uL (ref 0.0–0.1)
Immature Granulocytes: 0 %
Lymphocytes Absolute: 1.4 10*3/uL (ref 0.7–3.1)
Lymphs: 19 %
MCH: 31.2 pg (ref 26.6–33.0)
MCHC: 35.4 g/dL (ref 31.5–35.7)
MCV: 88 fL (ref 79–97)
Monocytes Absolute: 0.5 10*3/uL (ref 0.1–0.9)
Monocytes: 6 %
Neutrophils Absolute: 5.1 10*3/uL (ref 1.4–7.0)
Neutrophils: 69 %
Platelets: 218 10*3/uL (ref 150–450)
RBC: 5.03 x10E6/uL (ref 4.14–5.80)
RDW: 12.4 % (ref 11.6–15.4)
WBC: 7.5 10*3/uL (ref 3.4–10.8)

## 2021-04-28 LAB — LIPID PANEL W/O CHOL/HDL RATIO
Cholesterol, Total: 122 mg/dL (ref 100–199)
HDL: 53 mg/dL (ref >39)
LDL Chol Calc (NIH): 57 mg/dL (ref 0–99)
Triglycerides: 56 mg/dL (ref 0–149)
VLDL Cholesterol Cal: 12 mg/dL (ref 5–40)

## 2021-04-28 LAB — TSH: TSH: 1.37 u[IU]/mL (ref 0.450–4.500)

## 2021-04-28 LAB — LAMOTRIGINE LEVEL: Lamotrigine Lvl: 1.5 ug/mL — ABNORMAL LOW (ref 2.0–20.0)

## 2021-04-28 LAB — PSA: Prostate Specific Ag, Serum: 0.3 ng/mL (ref 0.0–4.0)

## 2021-06-05 ENCOUNTER — Ambulatory Visit: Payer: BC Managed Care – PPO | Admitting: Psychiatry

## 2021-06-27 ENCOUNTER — Ambulatory Visit (INDEPENDENT_AMBULATORY_CARE_PROVIDER_SITE_OTHER): Payer: BC Managed Care – PPO | Admitting: Family Medicine

## 2021-06-27 ENCOUNTER — Encounter: Payer: Self-pay | Admitting: Family Medicine

## 2021-06-27 VITALS — BP 98/62 | HR 80 | Temp 98.0°F | Ht 70.0 in | Wt 197.4 lb

## 2021-06-27 DIAGNOSIS — Z794 Long term (current) use of insulin: Secondary | ICD-10-CM

## 2021-06-27 DIAGNOSIS — Z1211 Encounter for screening for malignant neoplasm of colon: Secondary | ICD-10-CM

## 2021-06-27 DIAGNOSIS — E11319 Type 2 diabetes mellitus with unspecified diabetic retinopathy without macular edema: Secondary | ICD-10-CM

## 2021-06-27 DIAGNOSIS — I10 Essential (primary) hypertension: Secondary | ICD-10-CM

## 2021-06-27 DIAGNOSIS — E785 Hyperlipidemia, unspecified: Secondary | ICD-10-CM | POA: Diagnosis not present

## 2021-06-27 DIAGNOSIS — E1069 Type 1 diabetes mellitus with other specified complication: Secondary | ICD-10-CM

## 2021-06-27 DIAGNOSIS — F3181 Bipolar II disorder: Secondary | ICD-10-CM

## 2021-06-27 DIAGNOSIS — R3911 Hesitancy of micturition: Secondary | ICD-10-CM | POA: Diagnosis not present

## 2021-06-27 DIAGNOSIS — Z0000 Encounter for general adult medical examination without abnormal findings: Secondary | ICD-10-CM | POA: Diagnosis not present

## 2021-06-27 DIAGNOSIS — F339 Major depressive disorder, recurrent, unspecified: Secondary | ICD-10-CM

## 2021-06-27 DIAGNOSIS — K219 Gastro-esophageal reflux disease without esophagitis: Secondary | ICD-10-CM | POA: Diagnosis not present

## 2021-06-27 DIAGNOSIS — F3172 Bipolar disorder, in full remission, most recent episode hypomanic: Secondary | ICD-10-CM

## 2021-06-27 DIAGNOSIS — E7800 Pure hypercholesterolemia, unspecified: Secondary | ICD-10-CM

## 2021-06-27 LAB — MICROALBUMIN, URINE WAIVED
Creatinine, Urine Waived: 100 mg/dL (ref 10–300)
Microalb, Ur Waived: 10 mg/L (ref 0–19)
Microalb/Creat Ratio: <30 mg/g (ref <30)

## 2021-06-27 LAB — URINALYSIS, ROUTINE W REFLEX MICROSCOPIC
Bilirubin, UA: NEGATIVE
Glucose, UA: NEGATIVE
Ketones, UA: NEGATIVE
Leukocytes,UA: NEGATIVE
Nitrite, UA: NEGATIVE
Protein,UA: NEGATIVE
RBC, UA: NEGATIVE
Specific Gravity, UA: 1.01 (ref 1.005–1.030)
Urobilinogen, Ur: 0.2 mg/dL (ref 0.2–1.0)
pH, UA: 6.5 (ref 5.0–7.5)

## 2021-06-27 LAB — BAYER DCA HB A1C WAIVED: HB A1C (BAYER DCA - WAIVED): 6.4 % — ABNORMAL HIGH (ref 4.8–5.6)

## 2021-06-27 MED ORDER — LISINOPRIL 5 MG PO TABS: 5.0000 mg | ORAL_TABLET | Freq: Every day | ORAL | 1 refills | Status: DC

## 2021-06-27 NOTE — Assessment & Plan Note (Signed)
Would like to see a different psychiatrist. New referral placed today. Has been eating a lot more since he quit smoking. Will discuss adding wellbutrin for craving control when he gets established with the new one. Refills up to date.

## 2021-06-27 NOTE — Assessment & Plan Note (Signed)
Under good control on current regimen. Continue current regimen. Continue to monitor. Call with any concerns. Refills given up to date. Labs drawn today.

## 2021-06-27 NOTE — Assessment & Plan Note (Signed)
Running low. We will cut his lisinopril in 1/2 and recheck 3 months. Call with any concerns.

## 2021-06-27 NOTE — Progress Notes (Signed)
BP 98/62   Pulse 80   Temp 98 F (36.7 C)   Ht _0  (1.778 m)   Wt 197 lb 6.4 oz (89.5 kg)   SpO2 96%   BMI 28.32 kg/m    Subjective:    Patient ID: Bernard [name], male    DOB: [birth year], 46 y.o.   MRN: [id number]  HPI: Bernard [name] is a 46 y.o. male presenting on 06/27/2021 for comprehensive medical examination. Current medical complaints include:  DIABETES Hypoglycemic episodes:no Polydipsia/polyuria: no Visual disturbance: no Chest pain: no Paresthesias: no Glucose Monitoring: yes  Accucheck frequency: continuous Taking Insulin?: yes Blood Pressure Monitoring: a few times a week Retinal Examination: Up to Date Foot Exam: Up to Date Diabetic Education: Completed Pneumovax: Up to Date Influenza: Up to Date Aspirin: yes  HYPERTENSION / HYPERLIPIDEMIA Satisfied with current treatment? yes Duration of hypertension: chronic BP monitoring frequency: not checking BP medication side effects: no Past BP meds: lisinopril Duration of hyperlipidemia: chronic Cholesterol medication side effects: no Cholesterol supplements: none Past cholesterol medications: atorvastatin Medication compliance: excellent compliance Aspirin: yes Recent stressors: no Recurrent headaches: no Visual changes: no Palpitations: no Dyspnea: no Chest pain: no Lower extremity edema: no Dizzy/lightheaded: no  BIPOLAR Mood status: stable Satisfied with current treatment?: yes Symptom severity: mild  Duration of current treatment : chronic Side effects: no Medication compliance: excellent compliance Depressed mood: no Anxious mood: no Anhedonia: no Significant weight loss or gain: no Insomnia: no  Fatigue: no Feelings of worthlessness or guilt: no Impaired concentration/indecisiveness: no Suicidal ideations: no Hopelessness: no Crying spells: no    06/27/2021    8:37 AM 04/27/2021    9:20 AM 02/02/2021    1:25 PM 12/22/2020    9:38 AM 10/28/2020   10:32 AM   Depression screen PHQ 2/9  Decreased Interest 0 0 0  0  Down, Depressed, Hopeless _1 PHQ - 2 Score _2 Altered sleeping 0 3 0  3  Tired, decreased energy _3 0  Change in appetite 2 0 0  0  Feeling bad or failure about yourself  1 0 0  0  Trouble concentrating 0 0 0  0  Moving slowly or fidgety/restless 0 0 0  0  Suicidal thoughts 0 0 0  0  PHQ-9 Score _4 Difficult doing work/chores     Not difficult at all     Information is confidential and restricted. Go to Review Flowsheets to unlock data.     Interim Problems from his last visit: no  Depression Screen done today and results listed below:     06/27/2021    8:37 AM 04/27/2021    9:20 AM 02/02/2021    1:25 PM 12/22/2020    9:38 AM 10/28/2020   10:32 AM  Depression screen PHQ 2/9  Decreased Interest 0 0 0  0  Down, Depressed, Hopeless _5 PHQ - 2 Score _6 Altered sleeping 0 3 0  3  Tired, decreased energy _7 0  Change in appetite 2 0 0  0  Feeling bad or failure about yourself  1 0 0  0  Trouble concentrating 0 0 0  0  Moving slowly or fidgety/restless 0 0 0  0  Suicidal thoughts 0 0 0  0  PHQ-9 Score _8 Difficult doing  work/chores     Not difficult at all     Information is confidential and restricted. Go to Review Flowsheets to unlock data.    Past Medical History:  Past Medical History:  Diagnosis Date   Anxiety    Depression    Esophageal stricture    Hyperlipidemia    Hypertension    Weight loss     Surgical History:  Past Surgical History:  Procedure Laterality Date   PILONIDAL CYST EXCISION     WISDOM TOOTH EXTRACTION      Medications:  Current Outpatient Medications on File Prior to Visit  Medication Sig   ARIPiprazole (ABILIFY) 2 MG tablet TAKE ONE (1) TABLET BY MOUTH ONCE DAILY   atorvastatin (LIPITOR) 40 MG tablet Take 1 tablet (40 mg total) by mouth daily.   busPIRone (BUSPAR) 10 MG tablet TAKE (1) TABLET BY MOUTH THREE TIMES A DAY    Cetirizine HCl 10 MG CAPS Take 10 mg by mouth daily.   Cholecalciferol (VITAMIN D-3 PO) Take by mouth.   Continuous Blood Gluc Sensor (DEXCOM G6 SENSOR) MISC SMARTSIG:1 Each Topical Every 10 Days   Continuous Blood Gluc Transmit (DEXCOM G6 TRANSMITTER) MISC USE 1 EACH EVERY 3 (THREE) MONTHS   famotidine (PEPCID) 20 MG tablet TAKE (1) TABLET BY MOUTH TWICE DAILY   insulin aspart (NOVOLOG) 100 UNIT/ML injection INJECT UP TO 100 UNITS VIA PUMP EVERY DAY   Insulin Disposable Pump (OMNIPOD 5 G6 INTRO, GEN 5,) KIT Inject into the skin.   insulin glargine (LANTUS) 100 UNIT/ML Solostar Pen    Insulin Pen Needle 29G X 12.7MM MISC 1 each by Does not apply route 3 (three) times daily.   lamoTRIgine (LAMICTAL) 25 MG tablet TAKE (3) TABLETS BY MOUTH EVERY DAY.   Lancets (ONETOUCH DELICA PLUS [id number]) MISC    ondansetron (ZOFRAN ODT) 4 MG disintegrating tablet Take 1 tablet (4 mg total) by mouth every 8 (eight) hours as needed for nausea or vomiting.   pantoprazole (PROTONIX) 40 MG tablet TAKE (1) TABLET BY MOUTH EVERY DAY   rOPINIRole (REQUIP) 0.5 MG tablet Take 1 tablet (0.5 mg total) by mouth at bedtime.   simethicone (GAS-X) 80 MG chewable tablet Chew 1 tablet (80 mg total) by mouth every 6 (six) hours as needed for flatulence (or cramping).   No current facility-administered medications on file prior to visit.    Allergies:  Allergies  Allergen Reactions   Chantix [Varenicline] Other (See Comments)    Insomnia , shakes    Social History:  Social History   Socioeconomic History   Marital status: Married    Spouse name: joan    Number of children: 2   Years of education: Not on file   Highest education level: Bachelor's degree (e.g., BA, AB, BS)  Occupational History   Not on file  Tobacco Use   Smoking status: Former    Packs/day: 0.50    Types: Cigarettes    Start date: 01/22/2017   Smokeless tobacco: Never   Tobacco comments:    1/2 of cigarettes 6 per day- 01/19/2021  Vaping  Use   Vaping Use: Never used  Substance and Sexual Activity   Alcohol use: Yes    Alcohol/week: 2.0 standard drinks    Types: 2 Cans of beer per week    Comment: on occasion   Drug use: No   Sexual activity: Yes    Birth control/protection: None  Other Topics Concern   Not on file  Social History Narrative  Not on file   Social Determinants of Health   Financial Resource Strain: Not on file  Food Insecurity: Not on file  Transportation Needs: Not on file  Physical Activity: Not on file  Stress: Not on file  Social Connections: Not on file  Intimate Partner Violence: Not on file   Social History   Tobacco Use  Smoking Status Former   Packs/day: 0.50   Types: Cigarettes   Start date: 01/22/2017  Smokeless Tobacco Never  Tobacco Comments   1/2 of cigarettes 6 per day- 01/19/2021   Social History   Substance and Sexual Activity  Alcohol Use Yes   Alcohol/week: 2.0 standard drinks   Types: 2 Cans of beer per week   Comment: on occasion    Family History:  Family History  Problem Relation Age of Onset   Cancer Mother    Anxiety disorder Mother    Depression Mother    Cancer Father    Anxiety disorder Brother    Depression Brother     Past medical history, surgical history, medications, allergies, family history and social history reviewed with patient today and changes made to appropriate areas of the chart.   Review of Systems  Constitutional: Negative.   HENT: Negative.    Eyes: Negative.   Respiratory: Negative.    Cardiovascular: Negative.   Gastrointestinal: Negative.   Genitourinary: Negative.   Musculoskeletal:  Positive for myalgias. Negative for back pain, falls, joint pain and neck pain.  Skin: Negative.   All other ROS negative except what is listed above and in the HPI.      Objective:    BP 98/62   Pulse 80   Temp 98 F (36.7 C)   Ht _0  (1.778 m)   Wt 197 lb 6.4 oz (89.5 kg)   SpO2 96%   BMI 28.32 kg/m   Wt Readings from  Last 3 Encounters:  06/27/21 197 lb 6.4 oz (89.5 kg)  04/27/21 191 lb 6.4 oz (86.8 kg)  02/02/21 196 lb (88.9 kg)    Physical Exam Vitals and nursing note reviewed.  Constitutional:      General: He is not in acute distress.    Appearance: Normal appearance. He is not ill-appearing, toxic-appearing or diaphoretic.  HENT:     Head: Normocephalic and atraumatic.     Right Ear: Tympanic membrane, ear canal and external ear normal. There is no impacted cerumen.     Left Ear: Tympanic membrane, ear canal and external ear normal. There is no impacted cerumen.     Nose: Nose normal. No congestion or rhinorrhea.     Mouth/Throat:     Mouth: Mucous membranes are moist.     Pharynx: Oropharynx is clear. No oropharyngeal exudate or posterior oropharyngeal erythema.  Eyes:     General: No scleral icterus.       Right eye: No discharge.        Left eye: No discharge.     Extraocular Movements: Extraocular movements intact.     Conjunctiva/sclera: Conjunctivae normal.     Pupils: Pupils are equal, round, and reactive to light.  Neck:     Vascular: No carotid bruit.  Cardiovascular:     Rate and Rhythm: Normal rate and regular rhythm.     Pulses: Normal pulses.     Heart sounds: No murmur heard.   No friction rub. No gallop.  Pulmonary:     Effort: Pulmonary effort is normal. No respiratory distress.     Breath  sounds: Normal breath sounds. No stridor. No wheezing, rhonchi or rales.  Chest:     Chest wall: No tenderness.  Abdominal:     General: Abdomen is flat. Bowel sounds are normal. There is no distension.     Palpations: Abdomen is soft. There is no mass.     Tenderness: There is no abdominal tenderness. There is no right CVA tenderness, left CVA tenderness, guarding or rebound.     Hernia: No hernia is present.  Genitourinary:    Comments: Genital exam deferred with shared decision making Musculoskeletal:        General: No swelling, tenderness, deformity or signs of injury.      Cervical back: Normal range of motion and neck supple. No rigidity. No muscular tenderness.     Right lower leg: No edema.     Left lower leg: No edema.  Lymphadenopathy:     Cervical: No cervical adenopathy.  Skin:    General: Skin is warm and dry.     Capillary Refill: Capillary refill takes less than 2 seconds.     Coloration: Skin is not jaundiced or pale.     Findings: No bruising, erythema, lesion or rash.  Neurological:     General: No focal deficit present.     Mental Status: He is alert and oriented to person, place, and time.     Cranial Nerves: No cranial nerve deficit.     Sensory: No sensory deficit.     Motor: No weakness.     Coordination: Coordination normal.     Gait: Gait normal.     Deep Tendon Reflexes: Reflexes normal.  Psychiatric:        Mood and Affect: Mood normal.        Behavior: Behavior normal.        Thought Content: Thought content normal.        Judgment: Judgment normal.    Results for orders placed or performed in visit on 04/27/21  Comprehensive metabolic panel  Result Value Ref Range   Glucose 165 (H) 70 - 99 mg/dL   BUN 12 6 - 24 mg/dL   Creatinine, Ser 0.98 0.76 - 1.27 mg/dL   eGFR 97 >59 mL/min/1.73   BUN/Creatinine Ratio 12 9 - 20   Sodium 141 134 - 144 mmol/L   Potassium 4.4 3.5 - 5.2 mmol/L   Chloride 103 96 - 106 mmol/L   CO2 24 20 - 29 mmol/L   Calcium 9.3 8.7 - 10.2 mg/dL   Total Protein 6.1 6.0 - 8.5 g/dL   Albumin 4.8 4.0 - 5.0 g/dL   Globulin, Total 1.3 (L) 1.5 - 4.5 g/dL   Albumin/Globulin Ratio 3.7 (H) 1.2 - 2.2   Bilirubin Total 0.3 0.0 - 1.2 mg/dL   Alkaline Phosphatase 68 44 - 121 IU/L   AST 23 0 - 40 IU/L   ALT 30 0 - 44 IU/L  CBC with Differential/Platelet  Result Value Ref Range   WBC 7.5 3.4 - 10.8 x10E3/uL   RBC 5.03 4.14 - 5.80 x10E6/uL   Hemoglobin 15.7 13.0 - 17.7 g/dL   Hematocrit 44.4 37.5 - 51.0 %   MCV 88 79 - 97 fL   MCH 31.2 26.6 - 33.0 pg   MCHC 35.4 31.5 - 35.7 g/dL   RDW 12.4 11.6 - 15.4 %    Platelets 218 150 - 450 x10E3/uL   Neutrophils 69 Not Estab. %   Lymphs 19 Not Estab. %   Monocytes 6 Not Estab. %  Eos 5 Not Estab. %   Basos 1 Not Estab. %   Neutrophils Absolute 5.1 1.4 - 7.0 x10E3/uL   Lymphocytes Absolute 1.4 0.7 - 3.1 x10E3/uL   Monocytes Absolute 0.5 0.1 - 0.9 x10E3/uL   EOS (ABSOLUTE) 0.3 0.0 - 0.4 x10E3/uL   Basophils Absolute 0.1 0.0 - 0.2 x10E3/uL   Immature Granulocytes 0 Not Estab. %   Immature Grans (Abs) 0.0 0.0 - 0.1 x10E3/uL  Lipid Panel w/o Chol/HDL Ratio  Result Value Ref Range   Cholesterol, Total 122 100 - 199 mg/dL   Triglycerides 56 0 - 149 mg/dL   HDL 53 >39 mg/dL   VLDL Cholesterol Cal 12 5 - 40 mg/dL   LDL Chol Calc (NIH) 57 0 - 99 mg/dL  PSA  Result Value Ref Range   Prostate Specific Ag, Serum 0.3 0.0 - 4.0 ng/mL  TSH  Result Value Ref Range   TSH 1.370 0.450 - 4.500 uIU/mL  Urinalysis, Routine w reflex microscopic  Result Value Ref Range   Specific Gravity, UA 1.010 1.005 - 1.030   pH, UA 6.5 5.0 - 7.5   Color, UA Yellow Yellow   Appearance Ur Clear Clear   Leukocytes,UA Negative Negative   Protein,UA Negative Negative/Trace   Glucose, UA Negative Negative   Ketones, UA Negative Negative   RBC, UA Negative Negative   Bilirubin, UA Negative Negative   Urobilinogen, Ur 0.2 0.2 - 1.0 mg/dL   Nitrite, UA Negative Negative  Microalbumin, Urine Waived  Result Value Ref Range   Microalb, Ur Waived 10 0 - 19 mg/L   Creatinine, Urine Waived 50 10 - 300 mg/dL   Microalb/Creat Ratio 30-300 (H) <30 mg/g  Bayer DCA Hb A1c Waived  Result Value Ref Range   HB A1C (BAYER DCA - WAIVED) 6.6 (H) 4.8 - 5.6 %  Lamotrigine level  Result Value Ref Range   Lamotrigine Lvl 1.5 (L) 2.0 - 20.0 ug/mL      Assessment & Plan:   Problem List Items Addressed This Visit       Cardiovascular and Mediastinum   Hypertension    Running low. We will cut his lisinopril in 1/2 and recheck 3 months. Call with any concerns.        Relevant  Medications   lisinopril (ZESTRIL) 5 MG tablet   Other Relevant Orders   Comprehensive metabolic panel   TSH   Urinalysis, Routine w reflex microscopic     Digestive   Gastroesophageal reflux disease without esophagitis    Under good control on current regimen. Continue current regimen. Continue to monitor. Call with any concerns. Refills given up to date. Labs drawn today.         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 Will send copy of labs to endocrinology. Call with any concerns.        Relevant Medications   lisinopril (ZESTRIL) 5 MG tablet   Other Relevant Orders   Comprehensive metabolic panel   TSH   Urinalysis, Routine w reflex microscopic   Bayer DCA Hb A1c Waived   Microalbumin, Urine Waived   Hyperlipidemia due to type 1 diabetes mellitus ([place])    Under good control on current regimen. Continue current regimen. Continue to monitor. Call with any concerns. Refills given  up to date. Labs drawn today.         Relevant Medications   lisinopril (ZESTRIL) 5 MG tablet   Other Relevant Orders   Comprehensive metabolic panel   Lipid Panel w/o Chol/HDL Ratio   TSH   Urinalysis, Routine w reflex microscopic     Other   Depression, recurrent ([place])    Would like to see a different psychiatrist. New referral placed today. Has been eating a lot more since he quit smoking. Will discuss adding wellbutrin for craving control when he gets established with the new one. Refills up to date.        Hyperlipidemia    Under good control on current regimen. Continue current regimen. Continue to monitor. Call with any concerns. Refills given up to date. Labs drawn today.         Relevant Medications   lisinopril (ZESTRIL) 5 MG tablet   Other Relevant Orders   Comprehensive  metabolic panel   TSH   Urinalysis, Routine w reflex microscopic   Bipolar 2 disorder ([place])    Would like to see a different psychiatrist. New referral placed today. Has been eating a lot more since he quit smoking. Will discuss adding wellbutrin for craving control when he gets established with the new one. Refills up to date.        Relevant Orders   Ambulatory referral to Psychiatry   Bipolar disorder, in full remission, most recent episode hypomanic [facility])    Would like to see a different psychiatrist. New referral placed today. Has been eating a lot more since he quit smoking. Will discuss adding wellbutrin for craving control when he gets established with the new one. Refills up to date.        Other Visit Diagnoses     Routine general medical examination at a health care facility    -  Primary   Vaccines up to date. Screening labs checked today. Cologuard ordered. Continue diet and exercise. Call with any concerns.    Hesitancy       PSA checked today. Await results.    Relevant Orders   PSA   Screening for colon cancer       Cologuard ordered today.   Relevant Orders   Cologuard   Essential hypertension       Relevant Medications   lisinopril (ZESTRIL) 5 MG tablet        LABORATORY TESTING:  Health maintenance labs ordered today as discussed above.   The natural history of prostate cancer and ongoing controversy regarding screening and potential treatment outcomes of prostate cancer has been discussed with the patient. The meaning of a false positive PSA and a false negative PSA has been discussed. He indicates understanding of the limitations of this screening test and wishes to proceed with screening PSA testing.   IMMUNIZATIONS:   - Tdap: Tetanus vaccination status reviewed: last tetanus booster within 10 years. - Influenza: Up to date - Pneumovax: Up to date - Prevnar: Not applicable - COVID: Up to date - HPV: Not applicable - Shingrix vaccine: Not  applicable  SCREENING: - Colonoscopy: cologuard ordered today Discussed with patient purpose of the colonoscopy is to detect colon cancer at curable precancerous or early stages   PATIENT COUNSELING:    Sexuality: Discussed sexually transmitted diseases, partner selection, use of condoms, avoidance of unintended pregnancy  and contraceptive alternatives.   Advised to avoid cigarette smoking.  I discussed with the patient that most people either abstain from  alcohol or drink within safe limits (<=14/week and <=4 drinks/occasion for males, <=7/weeks and <= 3 drinks/occasion for females) and that the risk for alcohol disorders and other health effects rises proportionally with the number of drinks per week and how often a drinker exceeds daily limits.  Discussed cessation/primary prevention of drug use and availability of treatment for abuse.   Diet: Encouraged to adjust caloric intake to maintain  or achieve ideal body weight, to reduce intake of dietary saturated fat and total fat, to limit sodium intake by avoiding high sodium foods and not adding table salt, and to maintain adequate dietary potassium and calcium preferably from fresh fruits, vegetables, and low-fat dairy products.    stressed the importance of regular exercise  Injury prevention: Discussed safety belts, safety helmets, smoke detector, smoking near bedding or upholstery.   Dental health: Discussed importance of regular tooth brushing, flossing, and dental visits.   Follow up plan: NEXT PREVENTATIVE PHYSICAL DUE IN 1 YEAR. Return in about 3 months (around 09/27/2021).

## 2021-06-27 NOTE — Assessment & Plan Note (Signed)
Doing well with A1c of 6.4. Will send copy of labs to endocrinology. Call with any concerns.

## 2021-06-28 LAB — PSA: Prostate Specific Ag, Serum: 0.3 ng/mL (ref 0.0–4.0)

## 2021-06-28 LAB — COMPREHENSIVE METABOLIC PANEL
ALT: 48 IU/L — ABNORMAL HIGH (ref 0–44)
AST: 34 IU/L (ref 0–40)
Albumin/Globulin Ratio: 2.9 — ABNORMAL HIGH (ref 1.2–2.2)
Albumin: 4.9 g/dL (ref 4.0–5.0)
Alkaline Phosphatase: 63 IU/L (ref 44–121)
BUN/Creatinine Ratio: 10 (ref 9–20)
BUN: 11 mg/dL (ref 6–24)
Bilirubin Total: 0.6 mg/dL (ref 0.0–1.2)
CO2: 23 mmol/L (ref 20–29)
Calcium: 9.7 mg/dL (ref 8.7–10.2)
Chloride: 103 mmol/L (ref 96–106)
Creatinine, Ser: 1.15 mg/dL (ref 0.76–1.27)
Globulin, Total: 1.7 g/dL (ref 1.5–4.5)
Glucose: 128 mg/dL — ABNORMAL HIGH (ref 70–99)
Potassium: 4.2 mmol/L (ref 3.5–5.2)
Sodium: 142 mmol/L (ref 134–144)
Total Protein: 6.6 g/dL (ref 6.0–8.5)
eGFR: 80 mL/min/{1.73_m2} (ref >59)

## 2021-06-28 LAB — LIPID PANEL W/O CHOL/HDL RATIO
Cholesterol, Total: 131 mg/dL (ref 100–199)
HDL: 59 mg/dL (ref >39)
LDL Chol Calc (NIH): 61 mg/dL (ref 0–99)
Triglycerides: 50 mg/dL (ref 0–149)
VLDL Cholesterol Cal: 11 mg/dL (ref 5–40)

## 2021-06-28 LAB — CBC WITH DIFFERENTIAL/PLATELET
Basophils Absolute: 0.1 10*3/uL (ref 0.0–0.2)
Basos: 1 %
EOS (ABSOLUTE): 0.3 10*3/uL (ref 0.0–0.4)
Eos: 5 %
Hematocrit: 45.9 % (ref 37.5–51.0)
Hemoglobin: 16.4 g/dL (ref 13.0–17.7)
Immature Grans (Abs): 0 10*3/uL (ref 0.0–0.1)
Immature Granulocytes: 1 %
Lymphocytes Absolute: 1.5 10*3/uL (ref 0.7–3.1)
Lymphs: 25 %
MCH: 31.4 pg (ref 26.6–33.0)
MCHC: 35.7 g/dL (ref 31.5–35.7)
MCV: 88 fL (ref 79–97)
Monocytes Absolute: 0.4 10*3/uL (ref 0.1–0.9)
Monocytes: 8 %
Neutrophils Absolute: 3.5 10*3/uL (ref 1.4–7.0)
Neutrophils: 60 %
Platelets: 214 10*3/uL (ref 150–450)
RBC: 5.23 x10E6/uL (ref 4.14–5.80)
RDW: 12.4 % (ref 11.6–15.4)
WBC: 5.9 10*3/uL (ref 3.4–10.8)

## 2021-06-28 LAB — TSH: TSH: 1.32 u[IU]/mL (ref 0.450–4.500)

## 2021-07-07 DIAGNOSIS — E109 Type 1 diabetes mellitus without complications: Secondary | ICD-10-CM | POA: Diagnosis not present

## 2021-07-07 DIAGNOSIS — E1159 Type 2 diabetes mellitus with other circulatory complications: Secondary | ICD-10-CM | POA: Diagnosis not present

## 2021-07-07 DIAGNOSIS — I152 Hypertension secondary to endocrine disorders: Secondary | ICD-10-CM | POA: Diagnosis not present

## 2021-07-07 DIAGNOSIS — E1069 Type 1 diabetes mellitus with other specified complication: Secondary | ICD-10-CM | POA: Diagnosis not present

## 2021-07-12 DIAGNOSIS — Z1211 Encounter for screening for malignant neoplasm of colon: Secondary | ICD-10-CM | POA: Diagnosis not present

## 2021-07-21 LAB — COLOGUARD: COLOGUARD: NEGATIVE

## 2021-07-25 ENCOUNTER — Encounter: Payer: Self-pay | Admitting: Family Medicine

## 2021-07-27 ENCOUNTER — Telehealth: Payer: Self-pay

## 2021-07-27 NOTE — Telephone Encounter (Signed)
I have seen message. I have not had time to write said letter.

## 2021-07-31 ENCOUNTER — Encounter: Payer: Self-pay | Admitting: Family Medicine

## 2021-08-10 DIAGNOSIS — F411 Generalized anxiety disorder: Secondary | ICD-10-CM | POA: Diagnosis not present

## 2021-08-10 DIAGNOSIS — F5105 Insomnia due to other mental disorder: Secondary | ICD-10-CM | POA: Diagnosis not present

## 2021-08-10 DIAGNOSIS — F39 Unspecified mood [affective] disorder: Secondary | ICD-10-CM | POA: Diagnosis not present

## 2021-08-15 DIAGNOSIS — F331 Major depressive disorder, recurrent, moderate: Secondary | ICD-10-CM | POA: Diagnosis not present

## 2021-08-15 DIAGNOSIS — F411 Generalized anxiety disorder: Secondary | ICD-10-CM | POA: Diagnosis not present

## 2021-08-29 DIAGNOSIS — F411 Generalized anxiety disorder: Secondary | ICD-10-CM | POA: Diagnosis not present

## 2021-08-29 DIAGNOSIS — F331 Major depressive disorder, recurrent, moderate: Secondary | ICD-10-CM | POA: Diagnosis not present

## 2021-08-29 DIAGNOSIS — F39 Unspecified mood [affective] disorder: Secondary | ICD-10-CM | POA: Diagnosis not present

## 2021-08-29 DIAGNOSIS — F5105 Insomnia due to other mental disorder: Secondary | ICD-10-CM | POA: Diagnosis not present

## 2021-08-30 IMAGING — MR MR HEAD W/O CM
12 series · 48 of 48 positions shown · non-contrast
Comparison: None.

CLINICAL DATA: Seizures, altered mental status.

EXAM:
MRI HEAD WITHOUT CONTRAST
TECHNIQUE: Multiplanar, multiecho pulse sequences of the brain and surrounding
structures were obtained without intravenous contrast.

[Series 5: ax dwi_tracew · axial · 3.0mm · 0.60mm/px · z∈[-113,+41]mm · 3 of 48 slices shown]
[im 1/48]
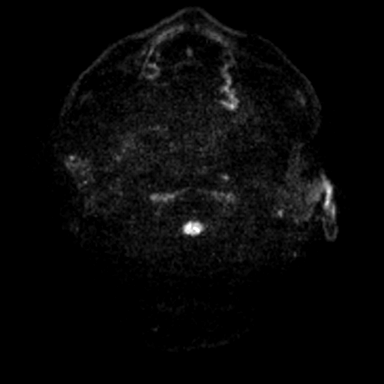
[im 24/48]
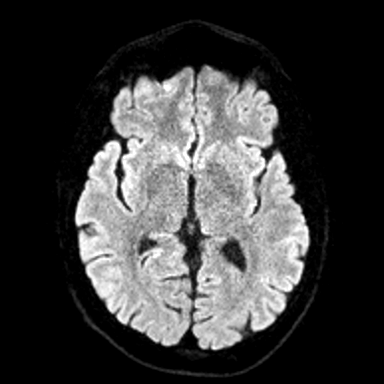
[im 48/48]
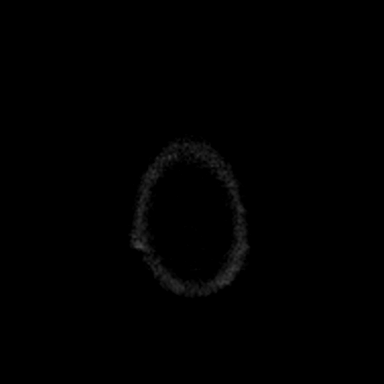

[Series 6: ax dwi_adc · axial · 3.0mm · 0.60mm/px · z∈[-113,+41]mm · 3 of 48 slices shown]
[im 1/48]
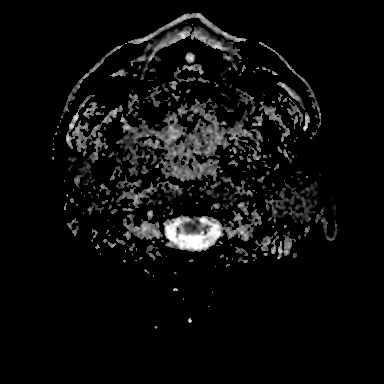
[im 24/48]
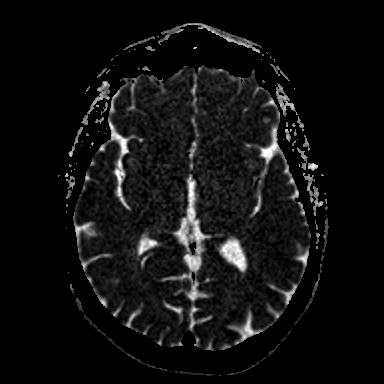
[im 48/48]
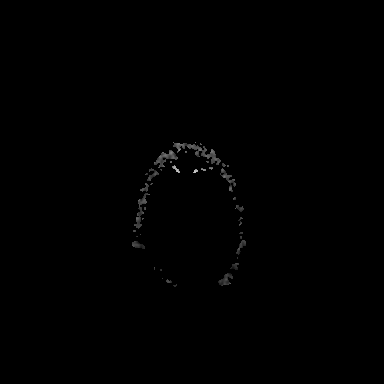

[Series 7: cor dwi_tracew · coronal · 5.0mm · 0.60mm/px · 2 of 36 slices shown]
[im 1/36]
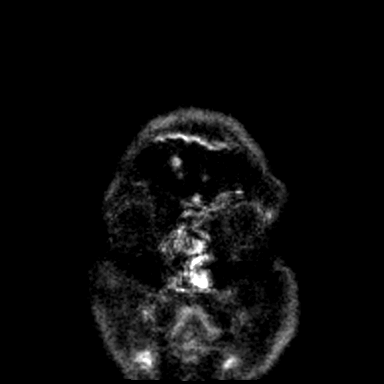
[im 36/36]
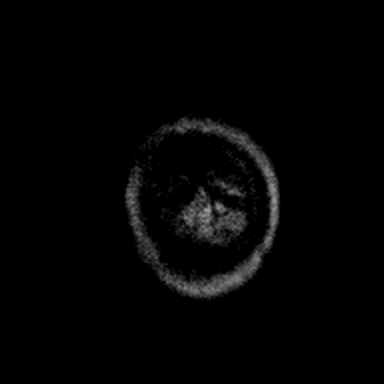

[Series 8: cor dwi_adc · coronal · 5.0mm · 0.60mm/px · 3 of 36 slices shown]
[im 1/36]
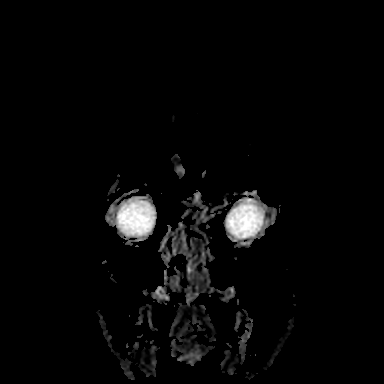
[im 18/36]
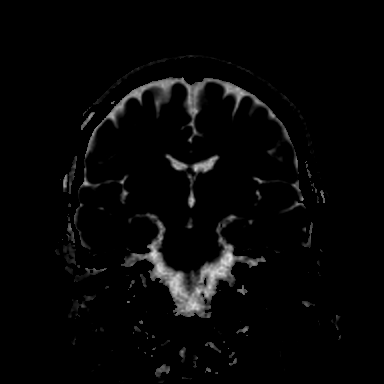
[im 36/36]
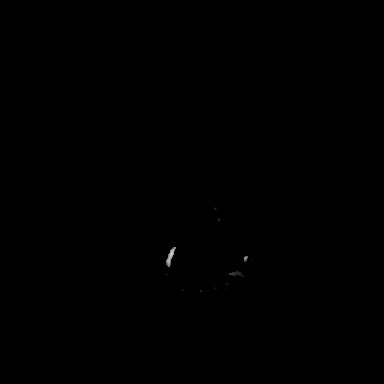

[Series 9: T1 · sagittal · 5.0mm · 0.62mm/px · 2 of 22 slices shown (1 of 2)]
[im 1/22]
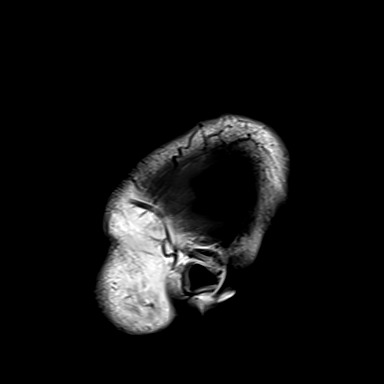
[im 22/22]
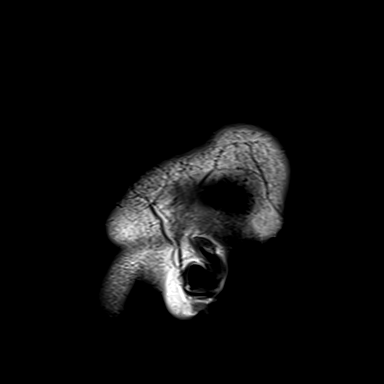

[Series 10: T2 · axial · 5.0mm · 0.53mm/px · z∈[-104,+40]mm · 2 of 25 slices shown (1 of 2)]
[im 1/25]
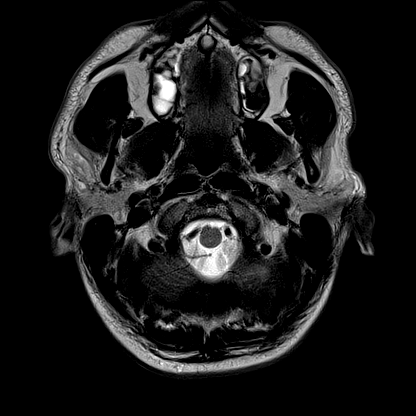
[im 25/25]
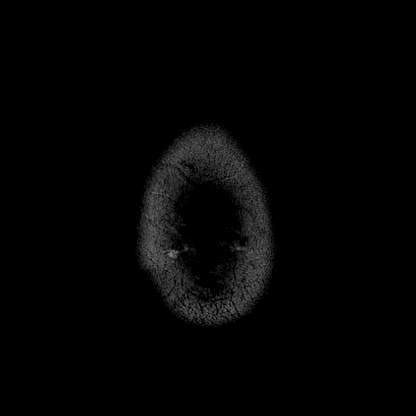

[Series 11: mag_images · axial · 3.0mm · 0.90mm/px · z∈[-120,+56]mm · 5 of 60 slices shown]
[im 1/60]
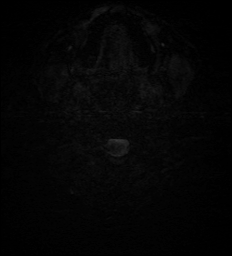
[im 15/60]
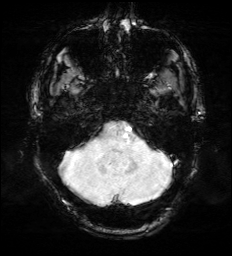
[im 30/60]
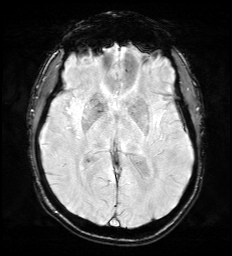
[im 45/60]
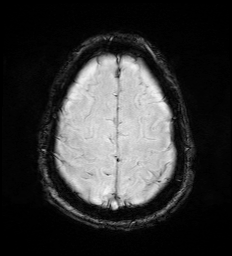
[im 60/60]
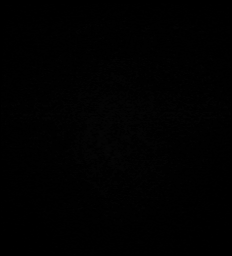

[Series 12: pha_images · axial · 3.0mm · 0.90mm/px · z∈[-120,+56]mm · 5 of 60 slices shown]
[im 1/60]
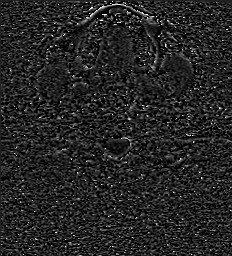
[im 15/60]
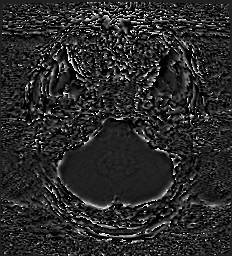
[im 30/60]
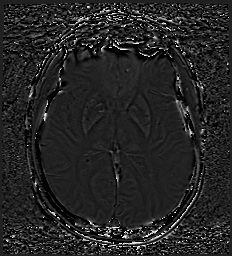
[im 45/60]
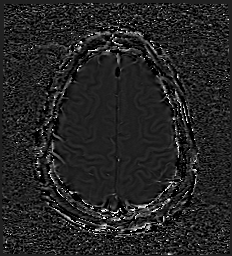
[im 60/60]
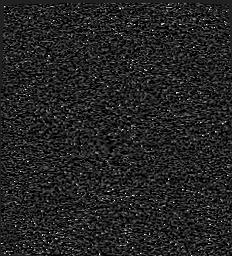

[Series 13: swi_images · axial · 3.0mm · 0.90mm/px · z∈[-120,+56]mm · 5 of 60 slices shown]
[im 1/60]
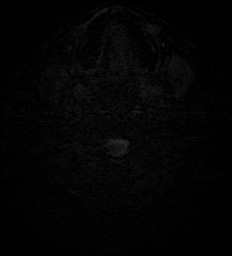
[im 15/60]
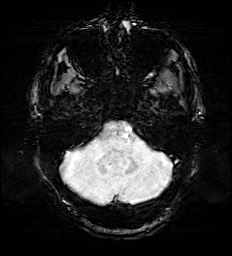
[im 30/60]
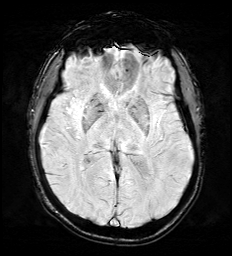
[im 45/60]
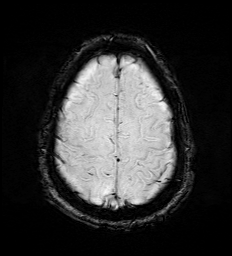
[im 60/60]
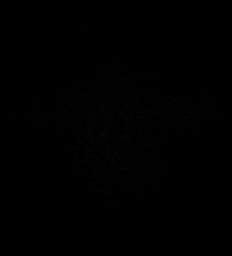

[Series 15: FLAIR · axial · 3.0mm · 0.53mm/px · z∈[-113,+49]mm · 4 of 55 slices shown]
[im 1/55]
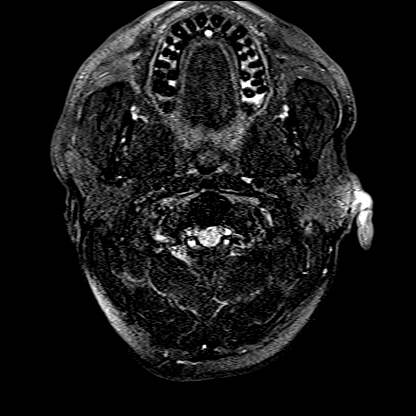
[im 19/55]
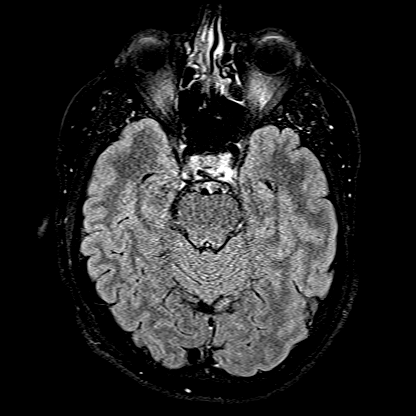
[im 37/55]
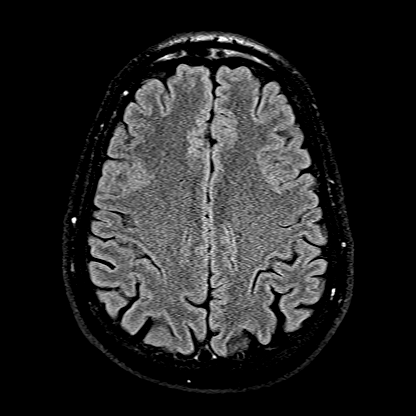
[im 55/55]
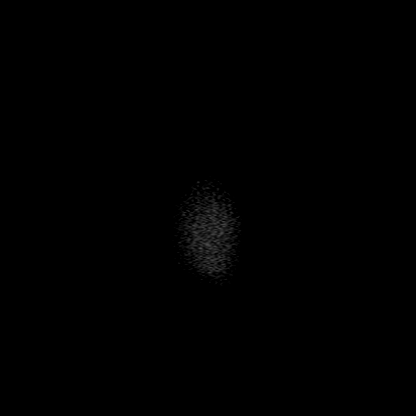

[Series 16: T1 · axial · 1.0mm · 0.98mm/px · z∈[-116,+42]mm · 12 of 160 slices shown (2 of 2)]
[im 1/160]
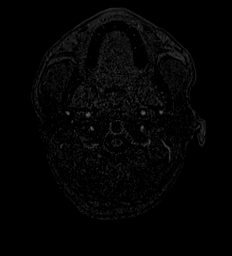
[im 15/160]
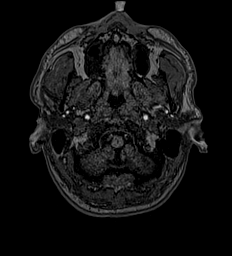
[im 29/160]
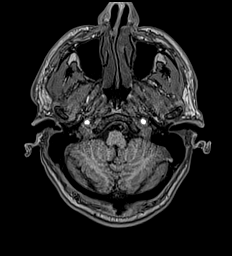
[im 44/160]
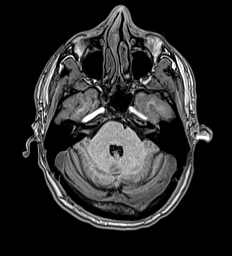
[im 58/160]
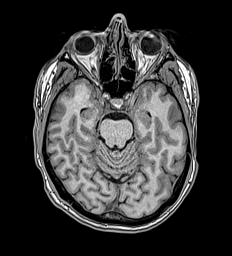
[im 73/160]
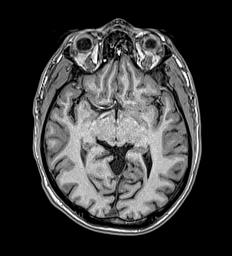
[im 87/160]
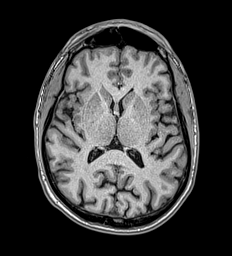
[im 102/160]
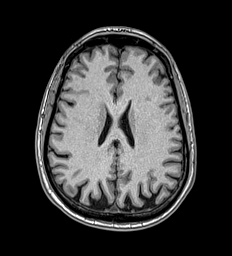
[im 116/160]
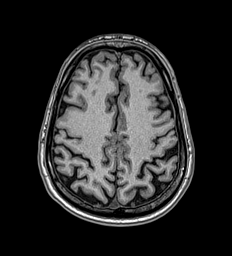
[im 131/160]
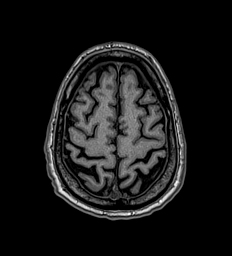
[im 145/160]
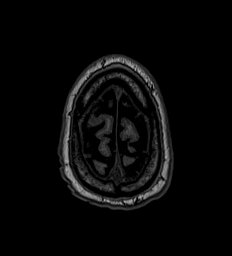
[im 160/160]
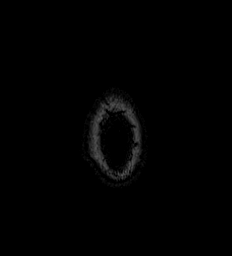

[Series 17: T2 · coronal · 5.0mm · 0.57mm/px · 2 of 27 slices shown (2 of 2)]
[im 1/27]
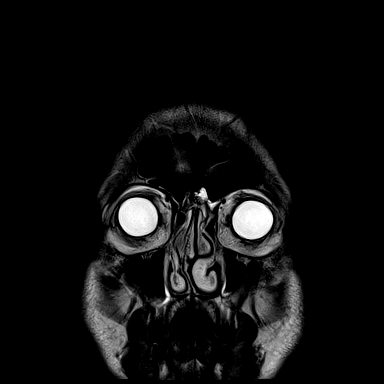
[im 27/27]
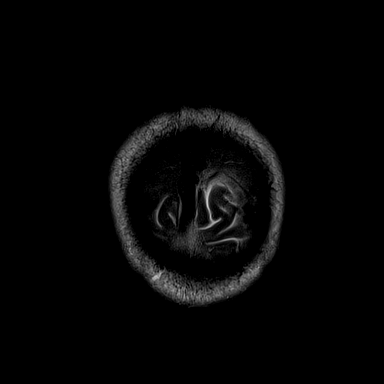

[48 of 48 positions shown; findings below may reference images not displayed]

FINDINGS: Brain: No diffusion-weighted signal abnormality. No intracranial
hemorrhage. No midline shift, ventriculomegaly or extra-axial fluid
collection. No mass lesion. Cerebral volume is within normal limits.
No significant white matter disease.

Vascular: Normal flow voids.

Skull and upper cervical spine: Normal marrow signal.

Sinuses/Orbits: Normal orbits. Mild pansinus mucosal thick. No
mastoid effusion.

Other: None.
IMPRESSION: Normal MRI brain. No acute intracranial process.

## 2021-09-06 DIAGNOSIS — F411 Generalized anxiety disorder: Secondary | ICD-10-CM | POA: Diagnosis not present

## 2021-09-06 DIAGNOSIS — F331 Major depressive disorder, recurrent, moderate: Secondary | ICD-10-CM | POA: Diagnosis not present

## 2021-09-25 ENCOUNTER — Other Ambulatory Visit: Payer: Self-pay | Admitting: Family Medicine

## 2021-09-25 DIAGNOSIS — F39 Unspecified mood [affective] disorder: Secondary | ICD-10-CM | POA: Diagnosis not present

## 2021-09-25 DIAGNOSIS — F5105 Insomnia due to other mental disorder: Secondary | ICD-10-CM | POA: Diagnosis not present

## 2021-09-25 DIAGNOSIS — F411 Generalized anxiety disorder: Secondary | ICD-10-CM | POA: Diagnosis not present

## 2021-09-26 NOTE — Telephone Encounter (Signed)
Requested Prescriptions  Pending Prescriptions Disp Refills  . famotidine (PEPCID) 20 MG tablet [Pharmacy Med Name: FAMOTIDINE 20 MG TAB] 180 tablet 1    Sig: TAKE (1) TABLET BY MOUTH TWICE DAILY     Gastroenterology:  H2 Antagonists Passed - 09/25/2021  8:18 AM      Passed - Valid encounter within last 12 months    Recent Outpatient Visits          3 months ago Routine general medical examination at a health care facility   [facility], Megan P, DO   5 months ago Pure hypercholesterolemia   [facility] [place], Megan P, DO   7 months ago Left hip pain   [facility] [place], Megan P, DO   11 months ago Type 1 diabetes mellitus without complication [facility])   [facility] [place], Megan P, DO   1 year ago Routine general medical examination at a health care facility   [facility] [place], Oralia Rud, DO      Future Appointments            In 1 week [facility], Oralia Rud, DO [facility]

## 2021-10-03 DIAGNOSIS — F411 Generalized anxiety disorder: Secondary | ICD-10-CM | POA: Diagnosis not present

## 2021-10-04 ENCOUNTER — Encounter: Payer: Self-pay | Admitting: Family Medicine

## 2021-10-04 ENCOUNTER — Ambulatory Visit (INDEPENDENT_AMBULATORY_CARE_PROVIDER_SITE_OTHER): Payer: BC Managed Care – PPO | Admitting: Family Medicine

## 2021-10-04 VITALS — BP 109/67 | HR 84 | Temp 97.9°F | Ht 70.0 in | Wt 213.1 lb

## 2021-10-04 DIAGNOSIS — E11319 Type 2 diabetes mellitus with unspecified diabetic retinopathy without macular edema: Secondary | ICD-10-CM | POA: Diagnosis not present

## 2021-10-04 DIAGNOSIS — M79605 Pain in left leg: Secondary | ICD-10-CM | POA: Diagnosis not present

## 2021-10-04 DIAGNOSIS — Z794 Long term (current) use of insulin: Secondary | ICD-10-CM

## 2021-10-04 DIAGNOSIS — I10 Essential (primary) hypertension: Secondary | ICD-10-CM

## 2021-10-04 LAB — BAYER DCA HB A1C WAIVED: HB A1C (BAYER DCA - WAIVED): 6.6 % — ABNORMAL HIGH (ref 4.8–5.6)

## 2021-10-04 NOTE — Patient Instructions (Signed)
[facility] [address] [place], [place] [address] Phone: [phone] Fax: [phone]

## 2021-10-04 NOTE — Assessment & Plan Note (Signed)
Doing great with a1c of 6.6. Continue current regimen. Continue to monitor.

## 2021-10-04 NOTE — Progress Notes (Signed)
BP 109/67   Pulse 84   Temp 97.9 F (36.6 C)   Ht 5\' 10"  (1.778 m)   Wt 213 lb 1.6 oz (96.7 kg)   SpO2 97%   BMI 30.58 kg/m    Subjective:    Patient ID: , male    DOB: 1975/06/15, 46 y.o.   MRN: [id number]  HPI: Bernard [name] is a 46 y.o. male  Chief Complaint  Patient presents with   Hypertension   Diabetes   Pain    Patient states he has burning pain in his left leg. Patient states burning sensation starts at his toe up to his thigh, happens more with certain movements.    Has been having a burning pain in his L leg from his thigh down to his foot. It happens when he goes into a figure 4 when sitting but not when he's standing. He notes that it is pretty severe and can get up to a 8-9/10. No weakness. He has been doing stretches for his back which seem to help but he thinks may have caused this. He is otherwise doing well with no other concerns or complaints at this time.   HYPERTENSION without Chronic Kidney Disease Hypertension status: controlled  Satisfied with current treatment? yes Duration of hypertension: chronic BP monitoring frequency:  a few times a month BP medication side effects:  no Medication compliance: excellent compliance Previous BP meds: lisinopril Aspirin: yes Recurrent headaches: no Visual changes: no Palpitations: no Dyspnea: no Chest pain: no Lower extremity edema: no Dizzy/lightheaded: no  DIABETES Hypoglycemic episodes:no Polydipsia/polyuria: no Visual disturbance: no Chest pain: no Paresthesias: no Glucose Monitoring: yes  Accucheck frequency: continuous Taking Insulin?: yes Blood Pressure Monitoring: not checking Retinal Examination: Up to Date Foot Exam: Up to Date Diabetic Education: Completed Pneumovax: Up to Date Influenza: Not up to Date Aspirin: yes  Relevant past medical, surgical, family and social history reviewed and updated as indicated. Interim medical history since our last visit  reviewed. Allergies and medications reviewed and updated.  Review of Systems  Constitutional: Negative.   Respiratory: Negative.    Cardiovascular: Negative.   Gastrointestinal: Negative.   Musculoskeletal: Negative.   Neurological: Negative.   Psychiatric/Behavioral: Negative.      Per HPI unless specifically indicated above     Objective:    BP 109/67   Pulse 84   Temp 97.9 F (36.6 C)   Ht 5\' 10"  (1.778 m)   Wt 213 lb 1.6 oz (96.7 kg)   SpO2 97%   BMI 30.58 kg/m   Wt Readings from Last 3 Encounters:  10/04/21 213 lb 1.6 oz (96.7 kg)  06/27/21 197 lb 6.4 oz (89.5 kg)  04/27/21 191 lb 6.4 oz (86.8 kg)    Physical Exam Vitals and nursing note reviewed.  Constitutional:      General: He is not in acute distress.    Appearance: Normal appearance. He is not ill-appearing, toxic-appearing or diaphoretic.  HENT:     Head: Normocephalic and atraumatic.     Right Ear: External ear normal.     Left Ear: External ear normal.     Nose: Nose normal.     Mouth/Throat:     Mouth: Mucous membranes are moist.     Pharynx: Oropharynx is clear.  Eyes:     General: No scleral icterus.       Right eye: No discharge.        Left eye: No discharge.  Extraocular Movements: Extraocular movements intact.     Conjunctiva/sclera: Conjunctivae normal.     Pupils: Pupils are equal, round, and reactive to light.  Cardiovascular:     Rate and Rhythm: Normal rate and regular rhythm.     Pulses: Normal pulses.     Heart sounds: Normal heart sounds. No murmur heard.    No friction rub. No gallop.  Pulmonary:     Effort: Pulmonary effort is normal. No respiratory distress.     Breath sounds: Normal breath sounds. No stridor. No wheezing, rhonchi or rales.  Chest:     Chest wall: No tenderness.  Musculoskeletal:        General: Normal range of motion.     Cervical back: Normal range of motion and neck supple.  Skin:    General: Skin is warm and dry.     Capillary Refill: Capillary  refill takes less than 2 seconds.     Coloration: Skin is not jaundiced or pale.     Findings: No bruising, erythema, lesion or rash.  Neurological:     General: No focal deficit present.     Mental Status: He is alert and oriented to person, place, and time. Mental status is at baseline.  Psychiatric:        Mood and Affect: Mood normal.        Behavior: Behavior normal.        Thought Content: Thought content normal.        Judgment: Judgment normal.     Results for orders placed or performed in visit on 07/07/21  HM DIABETES EYE EXAM  Result Value Ref Range   HM Diabetic Eye Exam Retinopathy (A) No Retinopathy      Assessment & Plan:   Problem List Items Addressed This Visit       Cardiovascular and Mediastinum   Hypertension - Primary    Under good control on current regimen. Continue current regimen. Continue to monitor. Call with any concerns. Refills up to date.        Relevant Orders   Basic metabolic panel     Endocrine   Type 2 diabetes mellitus with ophthalmic complication (HCC)    Doing great with a1c of 6.6. Continue current regimen. Continue to monitor.       Relevant Orders   Bayer DCA Hb A1c Waived   Other Visit Diagnoses     Left leg pain       Concern for nerve irritation from back stretches. Will get PT involved. Call with any concerns.    Relevant Orders   Ambulatory referral to Physical Therapy        Follow up plan: Return in about 3 months (around 01/04/2022).

## 2021-10-04 NOTE — Assessment & Plan Note (Signed)
Under good control on current regimen. Continue current regimen. Continue to monitor. Call with any concerns. Refills up to date.   

## 2021-10-05 LAB — BASIC METABOLIC PANEL
BUN/Creatinine Ratio: 8 — ABNORMAL LOW (ref 9–20)
BUN: 9 mg/dL (ref 6–24)
CO2: 21 mmol/L (ref 20–29)
Calcium: 9.4 mg/dL (ref 8.7–10.2)
Chloride: 105 mmol/L (ref 96–106)
Creatinine, Ser: 1.06 mg/dL (ref 0.76–1.27)
Glucose: 147 mg/dL — ABNORMAL HIGH (ref 70–99)
Potassium: 4.3 mmol/L (ref 3.5–5.2)
Sodium: 142 mmol/L (ref 134–144)
eGFR: 88 mL/min/{1.73_m2} (ref >59)

## 2021-10-06 ENCOUNTER — Telehealth: Payer: Self-pay | Admitting: Family Medicine

## 2021-10-06 NOTE — Telephone Encounter (Signed)
Heather calling from [employer] is calling to request insurance information. Cb- [phone] Fax- [phone]

## 2021-10-14 ENCOUNTER — Other Ambulatory Visit: Payer: Self-pay | Admitting: Family Medicine

## 2021-10-17 NOTE — Telephone Encounter (Signed)
Requested Prescriptions  Pending Prescriptions Disp Refills  . rOPINIRole (REQUIP) 0.5 MG tablet [Pharmacy Med Name: ROPINIROLE HCL 0.5 MG TAB] 90 tablet 1    Sig: TAKE (1) TABLET BY MOUTH DAILY AT BEDTIME     Neurology:  Parkinsonian Agents Passed - 10/14/2021  9:10 AM      Passed - Last BP in normal range    BP Readings from Last 1 Encounters:  10/04/21 109/67         Passed - Last Heart Rate in normal range    Pulse Readings from Last 1 Encounters:  10/04/21 84         Passed - Valid encounter within last 12 months    Recent Outpatient Visits          1 week ago Primary hypertension   [facility] [place], Megan P, DO   3 months ago Routine general medical examination at a health care facility   [facility], Megan P, DO   5 months ago Pure hypercholesterolemia   [facility] [place], Megan P, DO   8 months ago Left hip pain   [facility] [place], Megan P, DO   11 months ago Type 1 diabetes mellitus without complication [facility])   [facility] Dorcas Carrow, DO      Future Appointments            In 2 months Johnson, Oralia Rud, DO [facility], [facility]

## 2021-10-21 ENCOUNTER — Other Ambulatory Visit: Payer: Self-pay | Admitting: Family Medicine

## 2021-10-21 DIAGNOSIS — E7800 Pure hypercholesterolemia, unspecified: Secondary | ICD-10-CM

## 2021-10-23 NOTE — Telephone Encounter (Signed)
Requested Prescriptions  Pending Prescriptions Disp Refills  . atorvastatin (LIPITOR) 40 MG tablet [Pharmacy Med Name: ATORVASTATIN CALCIUM 40 MG TAB] 90 tablet 2    Sig: TAKE (1) TABLET BY MOUTH EVERY DAY     Cardiovascular:  Antilipid - Statins Failed - 10/21/2021 10:42 AM      Failed - Lipid Panel in normal range within the last 12 months    Cholesterol, Total  Date Value Ref Range Status  06/27/2021 131 100 - 199 mg/dL Final   Cholesterol Piccolo, Waived  Date Value Ref Range Status  08/06/2016 151 <200 mg/dL Final    Comment:                            Desirable                <200                         Borderline High      200- 239                         High                     >239    LDL Chol Calc (NIH)  Date Value Ref Range Status  06/27/2021 61 0 - 99 mg/dL Final   HDL  Date Value Ref Range Status  06/27/2021 59 >39 mg/dL Final   Triglycerides  Date Value Ref Range Status  06/27/2021 50 0 - 149 mg/dL Final   Triglycerides Piccolo,Waived  Date Value Ref Range Status  08/06/2016 66 <150 mg/dL Final    Comment:                            Normal                   <150                         Borderline High     150 - 199                         High                200 - 499                         Very High                >499          Passed - Patient is not pregnant      Passed - Valid encounter within last 12 months    Recent Outpatient Visits          2 weeks ago Primary hypertension   [place], Megan P, DO   3 months ago Routine general medical examination at a health care facility   [facility], [place] P, DO   5 months ago Pure hypercholesterolemia   [place], Megan P, DO   8 months ago Left hip pain   [place], Megan P, DO   12 months ago Type 1 diabetes mellitus without complication [facility])   [facility] [place] [place]  P, DO      Future  Appointments            In 2 months Johnson, Megan P, DO [facility], [facility]

## 2021-10-24 DIAGNOSIS — F411 Generalized anxiety disorder: Secondary | ICD-10-CM | POA: Diagnosis not present

## 2021-10-27 DIAGNOSIS — F411 Generalized anxiety disorder: Secondary | ICD-10-CM | POA: Diagnosis not present

## 2021-10-27 DIAGNOSIS — F5105 Insomnia due to other mental disorder: Secondary | ICD-10-CM | POA: Diagnosis not present

## 2021-10-27 DIAGNOSIS — F39 Unspecified mood [affective] disorder: Secondary | ICD-10-CM | POA: Diagnosis not present

## 2021-10-31 DIAGNOSIS — F411 Generalized anxiety disorder: Secondary | ICD-10-CM | POA: Diagnosis not present

## 2021-11-10 DIAGNOSIS — I152 Hypertension secondary to endocrine disorders: Secondary | ICD-10-CM | POA: Diagnosis not present

## 2021-11-10 DIAGNOSIS — E785 Hyperlipidemia, unspecified: Secondary | ICD-10-CM | POA: Diagnosis not present

## 2021-11-10 DIAGNOSIS — E1069 Type 1 diabetes mellitus with other specified complication: Secondary | ICD-10-CM | POA: Diagnosis not present

## 2021-11-10 DIAGNOSIS — E109 Type 1 diabetes mellitus without complications: Secondary | ICD-10-CM | POA: Diagnosis not present

## 2021-11-20 DIAGNOSIS — F411 Generalized anxiety disorder: Secondary | ICD-10-CM | POA: Diagnosis not present

## 2021-11-20 DIAGNOSIS — F5105 Insomnia due to other mental disorder: Secondary | ICD-10-CM | POA: Diagnosis not present

## 2021-11-20 DIAGNOSIS — F39 Unspecified mood [affective] disorder: Secondary | ICD-10-CM | POA: Diagnosis not present

## 2021-11-22 DIAGNOSIS — M4726 Other spondylosis with radiculopathy, lumbar region: Secondary | ICD-10-CM | POA: Diagnosis not present

## 2021-11-28 ENCOUNTER — Other Ambulatory Visit: Payer: Self-pay | Admitting: Family Medicine

## 2021-11-28 DIAGNOSIS — F411 Generalized anxiety disorder: Secondary | ICD-10-CM | POA: Diagnosis not present

## 2021-11-29 NOTE — Telephone Encounter (Signed)
Requested Prescriptions  Pending Prescriptions Disp Refills  . pantoprazole (PROTONIX) 40 MG tablet [Pharmacy Med Name: PANTOPRAZOLE SODIUM 40 MG DR TAB] 90 tablet 1    Sig: TAKE (1) TABLET BY MOUTH EVERY DAY     Gastroenterology: Proton Pump Inhibitors Passed - 11/28/2021  9:18 AM      Passed - Valid encounter within last 12 months    Recent Outpatient Visits          1 month ago Primary hypertension   [place], Megan P, DO   5 months ago Routine general medical examination at a health care facility   [facility] [place] P, DO   7 months ago Pure hypercholesterolemia   [place], Megan P, DO   10 months ago Left hip pain   [place], Megan P, DO   1 year ago Type 1 diabetes mellitus without complication [facility])   [place], Megan P, DO      Future Appointments            In 1 month Johnson, Barb Merino, DO [employer], [facility]

## 2021-12-08 DIAGNOSIS — M4726 Other spondylosis with radiculopathy, lumbar region: Secondary | ICD-10-CM | POA: Diagnosis not present

## 2021-12-11 DIAGNOSIS — H5213 Myopia, bilateral: Secondary | ICD-10-CM | POA: Diagnosis not present

## 2021-12-11 DIAGNOSIS — E103293 Type 1 diabetes mellitus with mild nonproliferative diabetic retinopathy without macular edema, bilateral: Secondary | ICD-10-CM | POA: Diagnosis not present

## 2021-12-11 LAB — HM DIABETES EYE EXAM

## 2021-12-12 DIAGNOSIS — F411 Generalized anxiety disorder: Secondary | ICD-10-CM | POA: Diagnosis not present

## 2021-12-20 DIAGNOSIS — M4726 Other spondylosis with radiculopathy, lumbar region: Secondary | ICD-10-CM | POA: Diagnosis not present

## 2021-12-26 DIAGNOSIS — F411 Generalized anxiety disorder: Secondary | ICD-10-CM | POA: Diagnosis not present

## 2021-12-27 DIAGNOSIS — M4726 Other spondylosis with radiculopathy, lumbar region: Secondary | ICD-10-CM | POA: Diagnosis not present

## 2022-01-03 DIAGNOSIS — M4726 Other spondylosis with radiculopathy, lumbar region: Secondary | ICD-10-CM | POA: Diagnosis not present

## 2022-01-04 ENCOUNTER — Ambulatory Visit: Payer: BC Managed Care – PPO | Admitting: Family Medicine

## 2022-01-05 ENCOUNTER — Emergency Department: Payer: BC Managed Care – PPO

## 2022-01-05 ENCOUNTER — Emergency Department
Admission: EM | Admit: 2022-01-05 | Discharge: 2022-01-05 | Disposition: A | Discharge status: Home / Self Care | Payer: BC Managed Care – PPO | Attending: Emergency Medicine | Admitting: Emergency Medicine

## 2022-01-05 ENCOUNTER — Other Ambulatory Visit: Payer: Self-pay

## 2022-01-05 DIAGNOSIS — R079 Chest pain, unspecified: Secondary | ICD-10-CM | POA: Insufficient documentation

## 2022-01-05 DIAGNOSIS — R0789 Other chest pain: Secondary | ICD-10-CM | POA: Diagnosis not present

## 2022-01-05 LAB — BASIC METABOLIC PANEL
Anion gap: 8 (ref 5–15)
BUN: 10 mg/dL (ref 6–20)
CO2: 24 mmol/L (ref 22–32)
Calcium: 9.1 mg/dL (ref 8.9–10.3)
Chloride: 108 mmol/L (ref 98–111)
Creatinine, Ser: 0.98 mg/dL (ref 0.61–1.24)
GFR, Estimated: >60 mL/min (ref >60)
Glucose, Bld: 130 mg/dL — ABNORMAL HIGH (ref 70–99)
Potassium: 3.8 mmol/L (ref 3.5–5.1)
Sodium: 140 mmol/L (ref 135–145)

## 2022-01-05 LAB — TROPONIN I (HIGH SENSITIVITY)
Troponin I (High Sensitivity): <2 ng/L (ref <18)
Troponin I (High Sensitivity): <2 ng/L (ref <18)

## 2022-01-05 LAB — LIPASE, BLOOD: Lipase: 24 U/L (ref 11–51)

## 2022-01-05 LAB — CBC
HCT: 43.5 % (ref 39.0–52.0)
Hemoglobin: 15.1 g/dL (ref 13.0–17.0)
MCH: 30.2 pg (ref 26.0–34.0)
MCHC: 34.7 g/dL (ref 30.0–36.0)
MCV: 87 fL (ref 80.0–100.0)
Platelets: 217 10*3/uL (ref 150–400)
RBC: 5 MIL/uL (ref 4.22–5.81)
RDW: 12.3 % (ref 11.5–15.5)
WBC: 7.3 10*3/uL (ref 4.0–10.5)
nRBC: 0 % (ref 0.0–0.2)

## 2022-01-05 MED ORDER — FAMOTIDINE IN NACL 20-0.9 MG/50ML-% IV SOLN
20.0000 mg | Freq: Once | INTRAVENOUS | Status: AC
  Administered 2022-01-05: 20 mg via INTRAVENOUS
  Filled 2022-01-05: qty 50

## 2022-01-05 NOTE — ED Provider Notes (Signed)
[facility] Provider Note    Event Date/Time   First MD Initiated Contact with Patient 01/05/22 1709     (approximate)   History   Chest Pain   HPI  Bernard [name] is a 46 y.o. male past medical history significant for GERD, prior esophageal dilation 15 years ago, who presents to the emergency department with chest pain.  Chest pain started earlier today after he sat down to eat a late lunch.  Severe sharp stabbing pain that was also a pressure pain to his upper chest wall.  States that it lasted for a short period of time and then improved.  Since that time intermittent episodes of sharp pain and a pressure pain to his chest wall.  Nothing improves the pain.  Somewhat worse with exertion.  No change with eating.  Denies any nausea or vomiting.  Daily alcohol use of 1-2 drinks of alcohol.  Denies any marijuana use.  Denies nicotine use for the past 9 months.  No family history of coronary artery disease at a young age.  No shortness of breath.  Denies any significant cough.  No falls or trauma.  No history of DVT or PE.     Physical Exam   Triage Vital Signs: ED Triage Vitals  Enc Vitals Group     BP 01/05/22 1536 134/86     Pulse Rate 01/05/22 1536 84     Resp 01/05/22 1536 16     Temp 01/05/22 1536 98.4 F (36.9 C)     Temp Source 01/05/22 1536 Oral     SpO2 01/05/22 1536 95 %     Weight 01/05/22 1534 213 lb (96.6 kg)     Height 01/05/22 1534 5\' 10"  (1.778 m)     Head Circumference --      Peak Flow --      Pain Score 01/05/22 1534 1     Pain Loc --      Pain Edu? --      Excl. in GC? --     Most recent vital signs: Vitals:   01/05/22 1536  BP: 134/86  Pulse: 84  Resp: 16  Temp: 98.4 F (36.9 C)  SpO2: 95%    Physical Exam Constitutional:      Appearance: He is well-developed.  HENT:     Head: Atraumatic.  Eyes:     Conjunctiva/sclera: Conjunctivae normal.  Cardiovascular:     Rate and Rhythm: Regular rhythm.     Heart  sounds: Normal heart sounds.  Pulmonary:     Effort: No respiratory distress.  Abdominal:     General: There is no abdominal bruit.     Palpations: Abdomen is soft.  Musculoskeletal:     Cervical back: Normal range of motion.  Skin:    General: Skin is warm.  Neurological:     Mental Status: He is alert. Mental status is at baseline.          IMPRESSION / MDM / ASSESSMENT AND PLAN / ED COURSE  I reviewed the triage vital signs and the nursing notes.  Differential diagnosis including ACS, gastritis/PUD, symptomatic cholelithiasis, esophageal spasm, pneumonia, pleurisy.  PERC negative, doubt PE.    EKG  I, 14/01/23, the attending physician, personally viewed and interpreted this ECG.   Rate: Normal  Rhythm: Normal sinus  Axis: Normal  Intervals: Normal  ST&T Change: None  No tachycardic or bradycardic dysrhythmias while on cardiac telemetry.  RADIOLOGY I independently reviewed imaging, my  interpretation of imaging: No focal findings consistent with pneumonia.  Chest x-ray is read as no acute findings      ED Results / Procedures / Treatments   Labs (all labs ordered are listed, but only abnormal results are displayed) Labs interpreted as -  Serial troponins negative.  No significant anemia.  Lipase normal.  Creatinine at baseline.  Labs Reviewed  BASIC METABOLIC PANEL - Abnormal; Notable for the following components:      Result Value   Glucose, Bld 130 (*)    All other components within normal limits  CBC  LIPASE, BLOOD  TROPONIN I (HIGH SENSITIVITY)  TROPONIN I (HIGH SENSITIVITY)    Reevaluation patient states he is feeling much better.  Serial troponins are negative.  Low risk heart score.  Low suspicion for ACS.  Clinical picture is not consistent with a dissection.  Possibly gastritis/PUD.  Discussed follow-up with primary care provider and possible GI follow-up.  Given return precautions for any worsening symptoms.   PROCEDURES:  Critical  Care performed: No  Procedures  Patient's presentation is most consistent with acute presentation with potential threat to life or bodily function.   MEDICATIONS ORDERED IN ED: Medications  famotidine (PEPCID) IVPB 20 mg premix (20 mg Intravenous New Bag/Given 01/05/22 1803)    FINAL CLINICAL IMPRESSION(S) / ED DIAGNOSES   Final diagnoses:  Chest pain, unspecified type     Rx / DC Orders   ED Discharge Orders     None        Note:  This document was prepared using Dragon voice recognition software and may include unintentional dictation errors.   Corena Herter, MD 01/05/22 1818

## 2022-01-05 NOTE — Discharge Instructions (Addendum)
You were seen in the emergency department for chest pain.  You had a heart enzyme and EKG that were normal.  Your chest x-ray was normal.  Concerned that it could be from acid reflux.  Your lab work was normal today.  Follow-up closely with your primary care provider.  Return to the emergency department for any worsening symptoms.

## 2022-01-05 NOTE — ED Triage Notes (Signed)
BIB EMS after upper chest pain in esophageal region that was intermittent started.  Reports it was come q5secs.  Initially 8/10 and after ASA 324mg  1/10.  Reports pain  non radiating.

## 2022-01-12 ENCOUNTER — Ambulatory Visit (INDEPENDENT_AMBULATORY_CARE_PROVIDER_SITE_OTHER): Payer: BC Managed Care – PPO | Admitting: Family Medicine

## 2022-01-12 ENCOUNTER — Encounter: Payer: Self-pay | Admitting: Family Medicine

## 2022-01-12 VITALS — BP 109/66 | HR 80 | Temp 97.8°F | Wt 218.9 lb

## 2022-01-12 DIAGNOSIS — E7800 Pure hypercholesterolemia, unspecified: Secondary | ICD-10-CM

## 2022-01-12 DIAGNOSIS — E10319 Type 1 diabetes mellitus with unspecified diabetic retinopathy without macular edema: Secondary | ICD-10-CM

## 2022-01-12 DIAGNOSIS — E785 Hyperlipidemia, unspecified: Secondary | ICD-10-CM

## 2022-01-12 DIAGNOSIS — F411 Generalized anxiety disorder: Secondary | ICD-10-CM

## 2022-01-12 DIAGNOSIS — E1069 Type 1 diabetes mellitus with other specified complication: Secondary | ICD-10-CM

## 2022-01-12 DIAGNOSIS — Z23 Encounter for immunization: Secondary | ICD-10-CM

## 2022-01-12 DIAGNOSIS — I10 Essential (primary) hypertension: Secondary | ICD-10-CM

## 2022-01-12 DIAGNOSIS — K219 Gastro-esophageal reflux disease without esophagitis: Secondary | ICD-10-CM | POA: Diagnosis not present

## 2022-01-12 DIAGNOSIS — F339 Major depressive disorder, recurrent, unspecified: Secondary | ICD-10-CM

## 2022-01-12 LAB — BAYER DCA HB A1C WAIVED: HB A1C (BAYER DCA - WAIVED): 6.8 % — ABNORMAL HIGH (ref 4.8–5.6)

## 2022-01-12 MED ORDER — LISINOPRIL 5 MG PO TABS: 5.0000 mg | ORAL_TABLET | Freq: Every day | ORAL | 1 refills | Status: DC

## 2022-01-12 MED ORDER — ATORVASTATIN CALCIUM 40 MG PO TABS: ORAL_TABLET | ORAL | 2 refills | Status: DC

## 2022-01-12 MED ORDER — FAMOTIDINE 20 MG PO TABS: ORAL_TABLET | ORAL | 1 refills | Status: DC

## 2022-01-12 MED ORDER — BUSPIRONE HCL 10 MG PO TABS: ORAL_TABLET | ORAL | 2 refills | Status: DC

## 2022-01-12 NOTE — Progress Notes (Signed)
BP 109/66   Pulse 80   Temp 97.8 F (36.6 C) (Oral)   Wt 218 lb 14.4 oz (99.3 kg)   SpO2 94%   BMI 31.41 kg/m    Subjective:    Patient ID: Bernard [name], male    DOB: 07-29-1975, 46 y.o.   MRN: [id number]  HPI: Bernard [name] is a 46 y.o. male  Chief Complaint  Patient presents with   Diabetes    Eye exam requested from Dr. Clydene Pugh   Hyperlipidemia   Hypertension   HYPERTENSION / HYPERLIPIDEMIA Satisfied with current treatment? yes Duration of hypertension: chronic BP monitoring frequency: rarely BP medication side effects: no Past BP meds: lisinopril Duration of hyperlipidemia: chronic Cholesterol medication side effects: no Cholesterol supplements: none Past cholesterol medications: atorvastatin Medication compliance: excellent compliance Aspirin: no Recent stressors: no Recurrent headaches: no Visual changes: no Palpitations: no Dyspnea: no Chest pain: no Lower extremity edema: no Dizzy/lightheaded: no  DIABETES Hypoglycemic episodes:no Polydipsia/polyuria: no Visual disturbance: no Chest pain: no Paresthesias: no Glucose Monitoring: yes  Accucheck frequency: continuous Taking Insulin?: yes Blood Pressure Monitoring: rarely Retinal Examination: Up to Date Foot Exam: Up to Date Diabetic Education: Completed Pneumovax: Up to Date Influenza: Up to Date Aspirin: no  BIPOLAR Mood status: stable Satisfied with current treatment?: yes Symptom severity: mild  Duration of current treatment : chronic Side effects: no Medication compliance: excellent compliance Depressed mood: no Anxious mood: no Anhedonia: no Significant weight loss or gain: no Insomnia: no  Fatigue: yes Feelings of worthlessness or guilt: no Impaired concentration/indecisiveness: no Suicidal ideations: no Hopelessness: no Crying spells: no    01/12/2022    3:05 PM 10/04/2021    9:14 AM 06/27/2021    8:37 AM 04/27/2021    9:20 AM 02/02/2021    1:25 PM   Depression screen PHQ 2/9  Decreased Interest 1 0 0 0 0  Down, Depressed, Hopeless 0 1 1 1 1   PHQ - 2 Score 1 1 1 1 1   Altered sleeping 2 2 0 3 0  Tired, decreased energy 0 1 1 2 2   Change in appetite 0 3 2 0 0  Feeling bad or failure about yourself  0 0 1 0 0  Trouble concentrating 0 0 0 0 0  Moving slowly or fidgety/restless 0 0 0 0 0  Suicidal thoughts 0 0 0 0 0  PHQ-9 Score 3 7 5 6 3   Difficult doing work/chores Somewhat difficult Not difficult at all       Relevant past medical, surgical, family and social history reviewed and updated as indicated. Interim medical history since our last visit reviewed. Allergies and medications reviewed and updated.  Review of Systems  Constitutional:  Positive for fatigue. Negative for activity change, appetite change, chills, diaphoresis, fever and unexpected weight change.  Respiratory: Negative.    Cardiovascular: Negative.   Gastrointestinal: Negative.   Musculoskeletal: Negative.   Neurological:  Positive for dizziness. Negative for tremors, seizures, syncope, facial asymmetry, speech difficulty, weakness, light-headedness, numbness and headaches.  Psychiatric/Behavioral: Negative.      Per HPI unless specifically indicated above     Objective:    BP 109/66   Pulse 80   Temp 97.8 F (36.6 C) (Oral)   Wt 218 lb 14.4 oz (99.3 kg)   SpO2 94%   BMI 31.41 kg/m   Wt Readings from Last 3 Encounters:  01/12/22 218 lb 14.4 oz (99.3 kg)  01/05/22 213 lb (96.6 kg)  10/04/21 213 lb 1.6  oz (96.7 kg)    Physical Exam Vitals and nursing note reviewed.  Constitutional:      General: He is not in acute distress.    Appearance: Normal appearance. He is not ill-appearing, toxic-appearing or diaphoretic.  HENT:     Head: Normocephalic and atraumatic.     Right Ear: External ear normal.     Left Ear: External ear normal.     Nose: Nose normal.     Mouth/Throat:     Mouth: Mucous membranes are moist.     Pharynx: Oropharynx is clear.   Eyes:     General: No scleral icterus.       Right eye: No discharge.        Left eye: No discharge.     Extraocular Movements: Extraocular movements intact.     Conjunctiva/sclera: Conjunctivae normal.     Pupils: Pupils are equal, round, and reactive to light.  Cardiovascular:     Rate and Rhythm: Normal rate and regular rhythm.     Pulses: Normal pulses.     Heart sounds: Normal heart sounds. No murmur heard.    No friction rub. No gallop.  Pulmonary:     Effort: Pulmonary effort is normal. No respiratory distress.     Breath sounds: Normal breath sounds. No stridor. No wheezing, rhonchi or rales.  Chest:     Chest wall: No tenderness.  Musculoskeletal:        General: Normal range of motion.     Cervical back: Normal range of motion and neck supple.  Skin:    General: Skin is warm and dry.     Capillary Refill: Capillary refill takes less than 2 seconds.     Coloration: Skin is not jaundiced or pale.     Findings: No bruising, erythema, lesion or rash.  Neurological:     General: No focal deficit present.     Mental Status: He is alert and oriented to person, place, and time. Mental status is at baseline.  Psychiatric:        Mood and Affect: Mood normal.        Behavior: Behavior normal.        Thought Content: Thought content normal.        Judgment: Judgment normal.     Results for orders placed or performed during the hospital encounter of 01/05/22  Basic metabolic panel  Result Value Ref Range   Sodium 140 135 - 145 mmol/L   Potassium 3.8 3.5 - 5.1 mmol/L   Chloride 108 98 - 111 mmol/L   CO2 24 22 - 32 mmol/L   Glucose, Bld 130 (H) 70 - 99 mg/dL   BUN 10 6 - 20 mg/dL   Creatinine, Ser 1.49 0.61 - 1.24 mg/dL   Calcium 9.1 8.9 - 70.2 mg/dL   GFR, Estimated >63 >78 mL/min   Anion gap 8 5 - 15  CBC  Result Value Ref Range   WBC 7.3 4.0 - 10.5 K/uL   RBC 5.00 4.22 - 5.81 MIL/uL   Hemoglobin 15.1 13.0 - 17.0 g/dL   HCT 58.8 50.2 - 77.4 %   MCV 87.0 80.0 -  100.0 fL   MCH 30.2 26.0 - 34.0 pg   MCHC 34.7 30.0 - 36.0 g/dL   RDW 12.8 78.6 - 76.7 %   Platelets 217 150 - 400 K/uL   nRBC 0.0 0.0 - 0.2 %  Lipase, blood  Result Value Ref Range   Lipase 24 11 - 51  U/L  Troponin I (High Sensitivity)  Result Value Ref Range   Troponin I (High Sensitivity) <2 <18 ng/L  Troponin I (High Sensitivity)  Result Value Ref Range   Troponin I (High Sensitivity) <2 <18 ng/L      Assessment & Plan:   Problem List Items Addressed This Visit       Cardiovascular and Mediastinum   Hypertension    Under good control on current regimen. Continue current regimen. Continue to monitor. Call with any concerns. Refills given. Labs drawn today.        Relevant Medications   atorvastatin (LIPITOR) 40 MG tablet   lisinopril (ZESTRIL) 5 MG tablet   Other Relevant Orders   CBC with Differential/Platelet   Comprehensive metabolic panel     Digestive   Gastroesophageal reflux disease without esophagitis    Under good control on current regimen. Continue current regimen. Continue to monitor. Call with any concerns. Refills given. Labs drawn today.       Relevant Medications   famotidine (PEPCID) 20 MG tablet   Other Relevant Orders   CBC with Differential/Platelet   Comprehensive metabolic panel     Endocrine   Hyperlipidemia due to type 1 diabetes mellitus (HCC)    Under good control on current regimen. Continue current regimen. Continue to monitor. Call with any concerns. Refills given. Labs drawn today.       Relevant Medications   atorvastatin (LIPITOR) 40 MG tablet   lisinopril (ZESTRIL) 5 MG tablet   Diabetic retinopathy associated with type 1 diabetes mellitus (HCC) - Primary    Doing well with A1c of 6.8. Needs glucose monitoring supplies as his current meter is >14 years old. Continue to follow with endocrinology. Call with any concerns.       Relevant Medications   atorvastatin (LIPITOR) 40 MG tablet   lisinopril (ZESTRIL) 5 MG tablet    Other Relevant Orders   CBC with Differential/Platelet   Bayer DCA Hb A1c Waived   Comprehensive metabolic panel     Other   Depression, recurrent (HCC)    Continue to follow with psychiatry. Doing well. Continue to monitor.      Relevant Medications   busPIRone (BUSPAR) 10 MG tablet   Other Relevant Orders   CBC with Differential/Platelet   Comprehensive metabolic panel   Hyperlipidemia    Under good control on current regimen. Continue current regimen. Continue to monitor. Call with any concerns. Refills given. Labs drawn today.      Relevant Medications   atorvastatin (LIPITOR) 40 MG tablet   lisinopril (ZESTRIL) 5 MG tablet   Other Relevant Orders   CBC with Differential/Platelet   Comprehensive metabolic panel   Lipid Panel w/o Chol/HDL Ratio   GAD (generalized anxiety disorder)    Continue to follow with psychiatry. Doing well. Continue to monitor.      Relevant Medications   busPIRone (BUSPAR) 10 MG tablet   Other Visit Diagnoses     Essential hypertension       Relevant Medications   atorvastatin (LIPITOR) 40 MG tablet   lisinopril (ZESTRIL) 5 MG tablet   Need for influenza vaccination       Relevant Orders   Flu Vaccine QUAD 7mo+IM (Fluarix, Fluzone & Alfiuria Quad PF) (Completed)        Follow up plan: Return in about 3 months (around 04/13/2022).

## 2022-01-12 NOTE — Assessment & Plan Note (Signed)
Under good control on current regimen. Continue current regimen. Continue to monitor. Call with any concerns. Refills given. Labs drawn today.   

## 2022-01-12 NOTE — Assessment & Plan Note (Signed)
Continue to follow with psychiatry. Doing well. Continue to monitor.

## 2022-01-12 NOTE — Assessment & Plan Note (Signed)
Continue to follow with psychiatry. Doing well. Continue to monitor. 

## 2022-01-12 NOTE — Assessment & Plan Note (Signed)
Doing well with A1c of 6.8. Needs glucose monitoring supplies as his current meter is >45 years old. Continue to follow with endocrinology. Call with any concerns.

## 2022-01-13 LAB — COMPREHENSIVE METABOLIC PANEL
ALT: 32 IU/L (ref 0–44)
AST: 26 IU/L (ref 0–40)
Albumin/Globulin Ratio: 3.4 — ABNORMAL HIGH (ref 1.2–2.2)
Albumin: 4.8 g/dL (ref 4.1–5.1)
Alkaline Phosphatase: 64 IU/L (ref 44–121)
BUN/Creatinine Ratio: 8 — ABNORMAL LOW (ref 9–20)
BUN: 9 mg/dL (ref 6–24)
Bilirubin Total: 0.6 mg/dL (ref 0.0–1.2)
CO2: 22 mmol/L (ref 20–29)
Calcium: 9.7 mg/dL (ref 8.7–10.2)
Chloride: 103 mmol/L (ref 96–106)
Creatinine, Ser: 1.13 mg/dL (ref 0.76–1.27)
Globulin, Total: 1.4 g/dL — ABNORMAL LOW (ref 1.5–4.5)
Glucose: 120 mg/dL — ABNORMAL HIGH (ref 70–99)
Potassium: 4.2 mmol/L (ref 3.5–5.2)
Sodium: 140 mmol/L (ref 134–144)
Total Protein: 6.2 g/dL (ref 6.0–8.5)
eGFR: 82 mL/min/{1.73_m2} (ref >59)

## 2022-01-13 LAB — CBC WITH DIFFERENTIAL/PLATELET
Basophils Absolute: 0.1 10*3/uL (ref 0.0–0.2)
Basos: 1 %
EOS (ABSOLUTE): 0.4 10*3/uL (ref 0.0–0.4)
Eos: 6 %
Hematocrit: 44 % (ref 37.5–51.0)
Hemoglobin: 15.3 g/dL (ref 13.0–17.7)
Immature Grans (Abs): 0.1 10*3/uL (ref 0.0–0.1)
Immature Granulocytes: 1 %
Lymphocytes Absolute: 1.9 10*3/uL (ref 0.7–3.1)
Lymphs: 30 %
MCH: 30.1 pg (ref 26.6–33.0)
MCHC: 34.8 g/dL (ref 31.5–35.7)
MCV: 87 fL (ref 79–97)
Monocytes Absolute: 0.5 10*3/uL (ref 0.1–0.9)
Monocytes: 8 %
Neutrophils Absolute: 3.5 10*3/uL (ref 1.4–7.0)
Neutrophils: 54 %
Platelets: 225 10*3/uL (ref 150–450)
RBC: 5.08 x10E6/uL (ref 4.14–5.80)
RDW: 11.8 % (ref 11.6–15.4)
WBC: 6.3 10*3/uL (ref 3.4–10.8)

## 2022-01-13 LAB — LIPID PANEL W/O CHOL/HDL RATIO
Cholesterol, Total: 129 mg/dL (ref 100–199)
HDL: 54 mg/dL (ref >39)
LDL Chol Calc (NIH): 63 mg/dL (ref 0–99)
Triglycerides: 55 mg/dL (ref 0–149)
VLDL Cholesterol Cal: 12 mg/dL (ref 5–40)

## 2022-01-15 ENCOUNTER — Other Ambulatory Visit: Payer: Self-pay | Admitting: Family Medicine

## 2022-01-15 DIAGNOSIS — F411 Generalized anxiety disorder: Secondary | ICD-10-CM

## 2022-01-15 DIAGNOSIS — E7800 Pure hypercholesterolemia, unspecified: Secondary | ICD-10-CM

## 2022-01-15 NOTE — Telephone Encounter (Unsigned)
Copied from [place] [id number] [id number]. Topic: General - Other >> Jan 15, 2022 10:19 AM Everette C wrote: Reason for CRM: Medication Refill - Medication: atorvastatin (LIPITOR) 40 MG tablet [[id number]]  busPIRone (BUSPAR) 10 MG tablet [[id number]]  Insulin Disposable Pump (OMNIPOD 5 G6 INTRO, GEN 5,) KIT [[id number]]  Has the patient contacted their pharmacy? Yes.  The patient has been directed to contact their PCP  (Agent: If no, request that the patient contact the pharmacy for the refill. If patient does not wish to contact the pharmacy document the reason why and proceed with request.) (Agent: If yes, when and what did the pharmacy advise?)  Preferred Pharmacy (with phone number or street name): [id number] [place]Phone: [phone]: [phone]: Not open 24 hours   Has the patient been seen for an appointment in the last year OR does the patient have an upcoming appointment? Yes.    Agent: Please be advised that RX refills may take up to 3 business days. We ask that you follow-up with your pharmacy.

## 2022-01-16 ENCOUNTER — Other Ambulatory Visit: Payer: Self-pay

## 2022-01-16 ENCOUNTER — Telehealth: Payer: Self-pay

## 2022-01-16 DIAGNOSIS — F411 Generalized anxiety disorder: Secondary | ICD-10-CM | POA: Diagnosis not present

## 2022-01-16 DIAGNOSIS — F39 Unspecified mood [affective] disorder: Secondary | ICD-10-CM | POA: Diagnosis not present

## 2022-01-16 MED ORDER — ONETOUCH ULTRA 2 W/DEVICE KIT: 1.0000 | PACK | Freq: Every day | 0 refills | Status: AC

## 2022-01-16 MED ORDER — ONETOUCH ULTRA VI STRP: 1.0000 | ORAL_STRIP | Freq: Every day | 3 refills | Status: AC

## 2022-01-16 MED ORDER — ONETOUCH ULTRASOFT LANCETS MISC: 1.0000 | Freq: Every day | 3 refills | Status: AC

## 2022-01-16 NOTE — Telephone Encounter (Signed)
Dr. Olevia Perches has sent in the rx for diabetes supplies.  Refused the refill requests for the atorvastatin 40 mg, Buspar 10 mg because these were sent to [facility].  Insulin Disposable pump (Omnipod 5 G6) also refused since Dr. Laural Benes has sent in new rx for supplies.

## 2022-01-16 NOTE — Telephone Encounter (Signed)
-----   Message from Dorcas Carrow, DO sent at 01/12/2022  3:28 PM EST ----- Diabetic testing supplies

## 2022-01-16 NOTE — Telephone Encounter (Signed)
Diabetic Supplies routed to provider for reviewed.

## 2022-01-22 ENCOUNTER — Other Ambulatory Visit: Payer: Self-pay | Admitting: Family Medicine

## 2022-01-22 NOTE — Telephone Encounter (Unsigned)
Copied from CRM [id number]. Topic: General - Other >> Jan 22, 2022  8:43 AM Everette C wrote: Reason for CRM: Medication Refill - Medication: rOPINIRole (REQUIP) 0.5 MG tablet [[id number]]  Has the patient contacted their pharmacy? Yes.  The patient has been directed to contact their PCP  (Agent: If no, request that the patient contact the pharmacy for the refill. If patient does not wish to contact the pharmacy document the reason why and proceed with request.) (Agent: If yes, when and what did the pharmacy advise?)  Preferred Pharmacy (with phone number or street name): [facility] [phone] Dan Humphreys, [place] - [address] [place] [address] Phone: [phone] Fax: [phone] Hours: Not open 24 hours   Has the patient been seen for an appointment in the last year OR does the patient have an upcoming appointment? Yes.    Agent: Please be advised that RX refills may take up to 3 business days. We ask that you follow-up with your pharmacy.

## 2022-01-23 MED ORDER — ROPINIROLE HCL 0.5 MG PO TABS: ORAL_TABLET | ORAL | 0 refills | Status: DC

## 2022-01-23 NOTE — Telephone Encounter (Signed)
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, unspecified retinopathy severity (HCC)   [facility] [place], Megan P, DO   3 months ago Primary hypertension   [facility] [place], Megan P, DO   7 months ago Routine general medical examination at a health care facility   [facility], Megan P, DO   9 months ago Pure hypercholesterolemia   [facility] [place], [place], DO   11 months ago Left hip pain   [facility], Oralia Rud, DO       Future Appointments             In 2 months Laural Benes, Oralia Rud, DO [facility], [facility]

## 2022-02-06 ENCOUNTER — Other Ambulatory Visit: Payer: Self-pay | Admitting: Family Medicine

## 2022-02-06 DIAGNOSIS — F411 Generalized anxiety disorder: Secondary | ICD-10-CM | POA: Diagnosis not present

## 2022-02-06 DIAGNOSIS — F331 Major depressive disorder, recurrent, moderate: Secondary | ICD-10-CM | POA: Diagnosis not present

## 2022-02-06 DIAGNOSIS — E7800 Pure hypercholesterolemia, unspecified: Secondary | ICD-10-CM

## 2022-02-06 NOTE — Telephone Encounter (Signed)
Medication Refill - Medication: atorvastatin (LIPITOR) 40 MG tablet   Has the patient contacted their pharmacy? Yes.     Preferred Pharmacy (with phone number or street name):  [id number] [place] [place] Phone: [phone]  Fax: [phone]     Has the patient been seen for an appointment in the last year OR does the patient have an upcoming appointment? Yes.    The last refill of this medication was sent to [facility] on 12/08 who no longer participates with the patients insurance. [facility] Please assist patient further

## 2022-02-07 MED ORDER — ATORVASTATIN CALCIUM 40 MG PO TABS: ORAL_TABLET | ORAL | 3 refills | Status: DC

## 2022-02-07 NOTE — Telephone Encounter (Signed)
Requested Prescriptions  Pending Prescriptions Disp Refills   atorvastatin (LIPITOR) 40 MG tablet 90 tablet 3    Sig: TAKE (1) TABLET BY MOUTH EVERY DAY     Cardiovascular:  Antilipid - Statins Failed - 02/06/2022 11:25 AM      Failed - Lipid Panel in normal range within the last 12 months    Cholesterol, Total  Date Value Ref Range Status  01/12/2022 129 100 - 199 mg/dL Final   Cholesterol Piccolo, Waived  Date Value Ref Range Status  08/06/2016 151 <200 mg/dL Final    Comment:                            Desirable                <200                         Borderline High      200- 239                         High                     >239    LDL Chol Calc (NIH)  Date Value Ref Range Status  01/12/2022 63 0 - 99 mg/dL Final   HDL  Date Value Ref Range Status  01/12/2022 54 >39 mg/dL Final   Triglycerides  Date Value Ref Range Status  01/12/2022 55 0 - 149 mg/dL Final   Triglycerides Piccolo,Waived  Date Value Ref Range Status  08/06/2016 66 <150 mg/dL Final    Comment:                            Normal                   <150                         Borderline High     150 - 199                         High                200 - 499                         Very High                >499          Passed - Patient is not pregnant      Passed - Valid encounter within last 12 months    Recent Outpatient Visits           3 weeks ago 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, unspecified retinopathy severity ([place])   [place], Megan P, DO   4 months ago Primary hypertension   [place], Megan P, DO   7 months ago Routine general medical examination at a health care facility   [facility] [place], Megan P, DO   9 months ago Pure hypercholesterolemia   [place], Megan P, DO   1 year ago Left hip pain   [facility]  Valerie Roys, DO       Future Appointments             In 2 months Wynetta Emery, Barb Merino, DO [facility], [facility]

## 2022-02-12 DIAGNOSIS — F411 Generalized anxiety disorder: Secondary | ICD-10-CM | POA: Diagnosis not present

## 2022-02-12 DIAGNOSIS — F39 Unspecified mood [affective] disorder: Secondary | ICD-10-CM | POA: Diagnosis not present

## 2022-02-13 DIAGNOSIS — F3130 Bipolar disorder, current episode depressed, mild or moderate severity, unspecified: Secondary | ICD-10-CM | POA: Diagnosis not present

## 2022-02-20 DIAGNOSIS — F411 Generalized anxiety disorder: Secondary | ICD-10-CM | POA: Diagnosis not present

## 2022-02-20 DIAGNOSIS — F331 Major depressive disorder, recurrent, moderate: Secondary | ICD-10-CM | POA: Diagnosis not present

## 2022-02-27 DIAGNOSIS — F3130 Bipolar disorder, current episode depressed, mild or moderate severity, unspecified: Secondary | ICD-10-CM | POA: Diagnosis not present

## 2022-02-28 ENCOUNTER — Other Ambulatory Visit: Payer: Self-pay | Admitting: Family Medicine

## 2022-02-28 DIAGNOSIS — I10 Essential (primary) hypertension: Secondary | ICD-10-CM

## 2022-02-28 MED ORDER — LISINOPRIL 5 MG PO TABS: 5.0000 mg | ORAL_TABLET | Freq: Every day | ORAL | 1 refills | Status: DC

## 2022-02-28 MED ORDER — FAMOTIDINE 20 MG PO TABS: ORAL_TABLET | ORAL | 1 refills | Status: DC

## 2022-02-28 MED ORDER — PANTOPRAZOLE SODIUM 40 MG PO TBEC: DELAYED_RELEASE_TABLET | ORAL | 1 refills | Status: DC

## 2022-02-28 NOTE — Telephone Encounter (Signed)
Requesting Rx sent to a different pharmacy. Requested Prescriptions  Pending Prescriptions Disp Refills   lisinopril (ZESTRIL) 5 MG tablet 90 tablet 1    Sig: Take 1 tablet (5 mg total) by mouth daily.     Cardiovascular:  ACE Inhibitors Passed - 02/28/2022 11:43 AM      Passed - Cr in normal range and within 180 days    Creatinine  Date Value Ref Range Status  05/24/2014 0.79 mg/dL Final    Comment:    0.61-1.24 NOTE: New Reference Range  04/13/14    Creatinine, Ser  Date Value Ref Range Status  01/12/2022 1.13 0.76 - 1.27 mg/dL Final         Passed - K in normal range and within 180 days    Potassium  Date Value Ref Range Status  01/12/2022 4.2 3.5 - 5.2 mmol/L Final  05/24/2014 4.3 mmol/L Final    Comment:    3.5-5.1 NOTE: New Reference Range  04/13/14          Passed - Patient is not pregnant      Passed - Last BP in normal range    BP Readings from Last 1 Encounters:  01/12/22 109/66         Passed - Valid encounter within last 6 months    Recent Outpatient Visits           1 month ago 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, unspecified retinopathy severity ([place])   [place], Megan P, DO   4 months ago Primary hypertension   [place], Megan P, DO   8 months ago Routine general medical examination at a health care facility   [facility], [place] P, DO   10 months ago Pure hypercholesterolemia   [place], [place], DO   1 year ago Left hip pain   [place], [place], DO       Future Appointments             In 1 month [place], Megan P, DO [place], [facility]             pantoprazole (PROTONIX) 40 MG tablet 90 tablet 1    Sig: Take 1 tablet by mouth every day     Gastroenterology: Proton Pump  Inhibitors Passed - 02/28/2022 11:43 AM      Passed - Valid encounter within last 12 months    Recent Outpatient Visits           1 month ago 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, unspecified retinopathy severity ([place])   [place], Megan P, DO   4 months ago Primary hypertension   [place], Megan P, DO   8 months ago Routine general medical examination at a health care facility   [place], [place], DO   10 months ago Pure hypercholesterolemia   [place], [place], DO   1 year ago Left hip pain   [place], Barb Merino, DO       Future Appointments             In 1 month Valerie Roys, DO [facility], [facility]             famotidine (PEPCID) 20 MG tablet 180 tablet 1    Sig: TAKE (1) TABLET BY MOUTH TWICE DAILY     Gastroenterology:  H2 Antagonists Passed - 02/28/2022 11:43 AM      Passed - Valid encounter within last 12 months    Recent Outpatient Visits           1 month ago 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, unspecified retinopathy severity ([place])   [place], Megan P, DO   4 months ago Primary hypertension   [place], [place] P, DO   8 months ago Routine general medical examination at a health care facility   [facility], [place], DO   10 months ago Pure hypercholesterolemia   [place], [place], DO   1 year ago Left hip pain   [place], Barb Merino, DO       Future Appointments             In 1 month Wynetta Emery, Barb Merino, DO [place], [facility]

## 2022-02-28 NOTE — Telephone Encounter (Signed)
Pt is using a new pharmacy and previously asked for all meds to be transferred to  [id number] [place] [place]    Pt now needs refill for pantoprazole (PROTONIX) 40 MG tablet    Pt will also need the following meds transferred for future refills   famotidine (PEPCID) 20 MG tablet [[id number]]  lisinopril (ZESTRIL) 5 MG tablet [[id number]]

## 2022-03-06 DIAGNOSIS — F411 Generalized anxiety disorder: Secondary | ICD-10-CM | POA: Diagnosis not present

## 2022-03-06 DIAGNOSIS — F331 Major depressive disorder, recurrent, moderate: Secondary | ICD-10-CM | POA: Diagnosis not present

## 2022-03-14 DIAGNOSIS — F3130 Bipolar disorder, current episode depressed, mild or moderate severity, unspecified: Secondary | ICD-10-CM | POA: Diagnosis not present

## 2022-03-20 DIAGNOSIS — F411 Generalized anxiety disorder: Secondary | ICD-10-CM | POA: Diagnosis not present

## 2022-03-20 DIAGNOSIS — F331 Major depressive disorder, recurrent, moderate: Secondary | ICD-10-CM | POA: Diagnosis not present

## 2022-03-27 DIAGNOSIS — F3130 Bipolar disorder, current episode depressed, mild or moderate severity, unspecified: Secondary | ICD-10-CM | POA: Diagnosis not present

## 2022-03-30 DIAGNOSIS — E1069 Type 1 diabetes mellitus with other specified complication: Secondary | ICD-10-CM | POA: Diagnosis not present

## 2022-03-30 DIAGNOSIS — I152 Hypertension secondary to endocrine disorders: Secondary | ICD-10-CM | POA: Diagnosis not present

## 2022-03-30 DIAGNOSIS — E785 Hyperlipidemia, unspecified: Secondary | ICD-10-CM | POA: Diagnosis not present

## 2022-03-30 DIAGNOSIS — E109 Type 1 diabetes mellitus without complications: Secondary | ICD-10-CM | POA: Diagnosis not present

## 2022-04-03 DIAGNOSIS — F411 Generalized anxiety disorder: Secondary | ICD-10-CM | POA: Diagnosis not present

## 2022-04-03 DIAGNOSIS — F331 Major depressive disorder, recurrent, moderate: Secondary | ICD-10-CM | POA: Diagnosis not present

## 2022-04-05 DIAGNOSIS — F39 Unspecified mood [affective] disorder: Secondary | ICD-10-CM | POA: Diagnosis not present

## 2022-04-05 DIAGNOSIS — F411 Generalized anxiety disorder: Secondary | ICD-10-CM | POA: Diagnosis not present

## 2022-04-10 ENCOUNTER — Other Ambulatory Visit: Payer: Self-pay | Admitting: Family Medicine

## 2022-04-10 NOTE — Telephone Encounter (Signed)
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

## 2022-04-13 ENCOUNTER — Ambulatory Visit: Payer: BC Managed Care – PPO | Admitting: Family Medicine

## 2022-04-13 DIAGNOSIS — F3130 Bipolar disorder, current episode depressed, mild or moderate severity, unspecified: Secondary | ICD-10-CM | POA: Diagnosis not present

## 2022-04-17 DIAGNOSIS — F331 Major depressive disorder, recurrent, moderate: Secondary | ICD-10-CM | POA: Diagnosis not present

## 2022-04-17 DIAGNOSIS — F411 Generalized anxiety disorder: Secondary | ICD-10-CM | POA: Diagnosis not present

## 2022-04-18 DIAGNOSIS — F3130 Bipolar disorder, current episode depressed, mild or moderate severity, unspecified: Secondary | ICD-10-CM | POA: Diagnosis not present

## 2022-04-24 DIAGNOSIS — F3130 Bipolar disorder, current episode depressed, mild or moderate severity, unspecified: Secondary | ICD-10-CM | POA: Diagnosis not present

## 2022-05-02 ENCOUNTER — Ambulatory Visit (INDEPENDENT_AMBULATORY_CARE_PROVIDER_SITE_OTHER): Payer: BC Managed Care – PPO | Admitting: Family Medicine

## 2022-05-02 ENCOUNTER — Encounter: Payer: Self-pay | Admitting: Family Medicine

## 2022-05-02 VITALS — BP 107/70 | HR 85 | Temp 97.9°F | Ht 70.0 in | Wt 219.9 lb

## 2022-05-02 DIAGNOSIS — K219 Gastro-esophageal reflux disease without esophagitis: Secondary | ICD-10-CM | POA: Diagnosis not present

## 2022-05-02 DIAGNOSIS — E10319 Type 1 diabetes mellitus with unspecified diabetic retinopathy without macular edema: Secondary | ICD-10-CM

## 2022-05-02 DIAGNOSIS — R202 Paresthesia of skin: Secondary | ICD-10-CM | POA: Diagnosis not present

## 2022-05-02 LAB — BAYER DCA HB A1C WAIVED: HB A1C (BAYER DCA - WAIVED): 6.5 % — ABNORMAL HIGH (ref 4.8–5.6)

## 2022-05-02 MED ORDER — CYCLOBENZAPRINE HCL 10 MG PO TABS: 10.0000 mg | ORAL_TABLET | Freq: Every day | ORAL | 0 refills | Status: DC

## 2022-05-02 MED ORDER — PANTOPRAZOLE SODIUM 40 MG PO TBEC: 40.0000 mg | DELAYED_RELEASE_TABLET | Freq: Two times a day (BID) | ORAL | 1 refills | Status: DC

## 2022-05-02 NOTE — Assessment & Plan Note (Signed)
Will increase his pantoprazole to BID and recheck in 3 months, if not significantly better, will get him into GI. Call with any concerns.

## 2022-05-02 NOTE — Progress Notes (Signed)
BP 107/70   Pulse 85   Temp 97.9 F (36.6 C) (Oral)   Ht 5\' 10"  (1.778 m)   Wt 219 lb 14.4 oz (99.7 kg)   SpO2 96%   BMI 31.55 kg/m    Subjective:    Patient ID: Bernard [name], male    DOB: August 28, 1975, 47 y.o.   MRN: [id number]  HPI: Bernard [name] is a 47 y.o. male  Chief Complaint  Patient presents with  . Diabetes  . Hypertension   DIABETES Hypoglycemic episodes:no Polydipsia/polyuria: no Visual disturbance: no Chest pain: no Paresthesias: yes Glucose Monitoring: yes  Accucheck frequency: continuous Taking Insulin?: yes- pump Blood Pressure Monitoring: rarely Retinal Examination: Up to Date Foot Exam: Up to Date Diabetic Education: Completed Pneumovax: Up to Date Influenza: Up to Date Aspirin: no  GERD GERD control status: worse Satisfied with current treatment? no Heartburn frequency: nightly Medication side effects: no  Medication compliance: excellent Previous GERD medications: famoditins, pantoprozole Antacid use frequency:   Duration:  Nature:  Location:  Heartburn duration:  Alleviatiating factors:   Aggravating factors:  Dysphagia: {Blank single:19197::"yes","no"} Odynophagia:  {Blank single:19197::"yes","no"} Hematemesis: {Blank single:19197::"yes","no"} Blood in stool: {Blank single:19197::"yes","no"} EGD: {Blank single:19197::"yes","no"}  Relevant past medical, surgical, family and social history reviewed and updated as indicated. Interim medical history since our last visit reviewed. Allergies and medications reviewed and updated.  Review of Systems  Constitutional: Negative.   Respiratory: Negative.    Cardiovascular: Negative.   Musculoskeletal:  Positive for myalgias. Negative for arthralgias, back pain, gait problem, joint swelling, neck pain and neck stiffness.  Skin: Negative.   Psychiatric/Behavioral: Negative.      Per HPI unless specifically indicated above     Objective:    BP 107/70   Pulse 85    Temp 97.9 F (36.6 C) (Oral)   Ht 5\' 10"  (1.778 m)   Wt 219 lb 14.4 oz (99.7 kg)   SpO2 96%   BMI 31.55 kg/m   Wt Readings from Last 3 Encounters:  05/02/22 219 lb 14.4 oz (99.7 kg)  01/12/22 218 lb 14.4 oz (99.3 kg)  01/05/22 213 lb (96.6 kg)    Physical Exam Vitals and nursing note reviewed.  Constitutional:      General: He is not in acute distress.    Appearance: Normal appearance. He is obese. He is not ill-appearing, toxic-appearing or diaphoretic.  HENT:     Head: Normocephalic and atraumatic.     Right Ear: External ear normal.     Left Ear: External ear normal.     Nose: Nose normal.     Mouth/Throat:     Mouth: Mucous membranes are moist.     Pharynx: Oropharynx is clear.  Eyes:     General: No scleral icterus.       Right eye: No discharge.        Left eye: No discharge.     Extraocular Movements: Extraocular movements intact.     Conjunctiva/sclera: Conjunctivae normal.     Pupils: Pupils are equal, round, and reactive to light.  Cardiovascular:     Rate and Rhythm: Normal rate and regular rhythm.     Pulses: Normal pulses.     Heart sounds: Normal heart sounds. No murmur heard.    No friction rub. No gallop.  Pulmonary:     Effort: Pulmonary effort is normal. No respiratory distress.     Breath sounds: Normal breath sounds. No stridor. No wheezing, rhonchi or rales.  Chest:  Chest wall: No tenderness.  Musculoskeletal:        General: Normal range of motion.     Cervical back: Normal range of motion and neck supple.  Skin:    General: Skin is warm and dry.     Capillary Refill: Capillary refill takes less than 2 seconds.     Coloration: Skin is not jaundiced or pale.     Findings: No bruising, erythema, lesion or rash.  Neurological:     General: No focal deficit present.     Mental Status: He is alert and oriented to person, place, and time. Mental status is at baseline.  Psychiatric:        Mood and Affect: Mood normal.        Behavior:  Behavior normal.        Thought Content: Thought content normal.        Judgment: Judgment normal.    Results for orders placed or performed in visit on 01/17/22  HM DIABETES EYE EXAM  Result Value Ref Range   HM Diabetic Eye Exam Retinopathy (A) No Retinopathy      Assessment & Plan:   Problem List Items Addressed This Visit   None    Follow up plan: No follow-ups on file.

## 2022-05-02 NOTE — Assessment & Plan Note (Signed)
Under good control with A1c of 6.5. Continue current regimen. Continue to monitor. Call with any concerns.  

## 2022-05-15 DIAGNOSIS — F411 Generalized anxiety disorder: Secondary | ICD-10-CM | POA: Diagnosis not present

## 2022-05-15 DIAGNOSIS — F331 Major depressive disorder, recurrent, moderate: Secondary | ICD-10-CM | POA: Diagnosis not present

## 2022-06-10 ENCOUNTER — Other Ambulatory Visit: Payer: Self-pay | Admitting: Family Medicine

## 2022-06-11 ENCOUNTER — Other Ambulatory Visit: Payer: Self-pay | Admitting: Family Medicine

## 2022-06-11 NOTE — Telephone Encounter (Signed)
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, unspecified retinopathy severity (HCC)   [facility] [facility] [place], Megan P, DO   8 months ago Primary hypertension   [place] [facility] [place], Megan P, DO   11 months ago Routine general medical examination at a health care facility   [facility] [place], [place] P, DO   1 year ago Pure hypercholesterolemia   [facility] [place], Oralia Rud, DO       Future Appointments             In 1 month Laural Benes, Oralia Rud, DO [place] [facility], [facility]

## 2022-06-11 NOTE — Telephone Encounter (Signed)
Medication Refill - Medication: pantoprazole (PROTONIX) 40 MG tablet   Has the patient contacted their pharmacy? Yes.   [facility] Patient states that when rx was transferred it was transferred with the old instructions of take 1 by mouth once daily. Patient would like updated rx to be sent in. Patient states new dosage is working well.  Preferred Pharmacy (with phone number or street name):  [id number] - [place], [place] - [address] [place] [address] [facility] [address] [facility] [address] Phone: [phone]  Fax: [phone]     Has the patient been seen for an appointment in the last year OR does the patient have an upcoming appointment? Yes.    Agent: Please be advised that RX refills may take up to 3 business days. We ask that you follow-up with your pharmacy.

## 2022-06-12 DIAGNOSIS — F411 Generalized anxiety disorder: Secondary | ICD-10-CM | POA: Diagnosis not present

## 2022-06-12 DIAGNOSIS — F331 Major depressive disorder, recurrent, moderate: Secondary | ICD-10-CM | POA: Diagnosis not present

## 2022-06-12 NOTE — Telephone Encounter (Signed)
I'm not sure what needs to be done here. Rx was sent in March

## 2022-06-12 NOTE — Telephone Encounter (Signed)
Requested medications are due for refill today.  no  Requested medications are on the active medications list.  yes  Last refill.   Future visit scheduled.   yes  Notes to clinic.  Per pt, rx needs sig updated. Please review.    Requested Prescriptions  Pending Prescriptions Disp Refills   pantoprazole (PROTONIX) 40 MG tablet 180 tablet 1    Sig: Take 1 tablet (40 mg total) by mouth 2 (two) times daily.     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, unspecified retinopathy severity (HCC)   [place] [facility], Megan P, DO   8 months ago Primary hypertension   [place] [facility] [place], Megan P, DO   11 months ago Routine general medical examination at a health care facility   [place] [place], [place] P, DO   1 year ago Pure hypercholesterolemia   [place] [facility] [place], Oralia Rud, DO       Future Appointments             In 1 month Laural Benes, Oralia Rud, DO [place] [facility], [facility]

## 2022-06-18 ENCOUNTER — Encounter: Payer: Self-pay | Admitting: Family Medicine

## 2022-06-19 ENCOUNTER — Other Ambulatory Visit: Payer: Self-pay | Admitting: Family Medicine

## 2022-06-19 MED ORDER — PANTOPRAZOLE SODIUM 40 MG PO TBEC: 40.0000 mg | DELAYED_RELEASE_TABLET | Freq: Two times a day (BID) | ORAL | 1 refills | Status: DC

## 2022-06-26 DIAGNOSIS — F331 Major depressive disorder, recurrent, moderate: Secondary | ICD-10-CM | POA: Diagnosis not present

## 2022-06-26 DIAGNOSIS — F411 Generalized anxiety disorder: Secondary | ICD-10-CM | POA: Diagnosis not present

## 2022-07-03 DIAGNOSIS — F411 Generalized anxiety disorder: Secondary | ICD-10-CM | POA: Diagnosis not present

## 2022-07-03 DIAGNOSIS — F39 Unspecified mood [affective] disorder: Secondary | ICD-10-CM | POA: Diagnosis not present

## 2022-07-10 DIAGNOSIS — F411 Generalized anxiety disorder: Secondary | ICD-10-CM | POA: Diagnosis not present

## 2022-07-10 DIAGNOSIS — F331 Major depressive disorder, recurrent, moderate: Secondary | ICD-10-CM | POA: Diagnosis not present

## 2022-07-15 ENCOUNTER — Encounter: Payer: Self-pay | Admitting: Family Medicine

## 2022-07-16 MED ORDER — ROPINIROLE HCL 0.5 MG PO TABS: 0.5000 mg | ORAL_TABLET | Freq: Every day | ORAL | 1 refills | Status: DC

## 2022-07-24 DIAGNOSIS — F331 Major depressive disorder, recurrent, moderate: Secondary | ICD-10-CM | POA: Diagnosis not present

## 2022-07-24 DIAGNOSIS — F411 Generalized anxiety disorder: Secondary | ICD-10-CM | POA: Diagnosis not present

## 2022-08-07 ENCOUNTER — Encounter: Payer: Self-pay | Admitting: Family Medicine

## 2022-08-07 ENCOUNTER — Ambulatory Visit (INDEPENDENT_AMBULATORY_CARE_PROVIDER_SITE_OTHER): Payer: BC Managed Care – PPO | Admitting: Family Medicine

## 2022-08-07 VITALS — BP 114/76 | HR 70 | Temp 97.7°F | Ht 69.29 in | Wt 200.4 lb

## 2022-08-07 DIAGNOSIS — E7800 Pure hypercholesterolemia, unspecified: Secondary | ICD-10-CM

## 2022-08-07 DIAGNOSIS — E785 Hyperlipidemia, unspecified: Secondary | ICD-10-CM

## 2022-08-07 DIAGNOSIS — E10319 Type 1 diabetes mellitus with unspecified diabetic retinopathy without macular edema: Secondary | ICD-10-CM

## 2022-08-07 DIAGNOSIS — F339 Major depressive disorder, recurrent, unspecified: Secondary | ICD-10-CM

## 2022-08-07 DIAGNOSIS — R569 Unspecified convulsions: Secondary | ICD-10-CM

## 2022-08-07 DIAGNOSIS — F411 Generalized anxiety disorder: Secondary | ICD-10-CM | POA: Diagnosis not present

## 2022-08-07 DIAGNOSIS — Z0000 Encounter for general adult medical examination without abnormal findings: Secondary | ICD-10-CM | POA: Diagnosis not present

## 2022-08-07 DIAGNOSIS — D2230 Melanocytic nevi of unspecified part of face: Secondary | ICD-10-CM

## 2022-08-07 DIAGNOSIS — F3181 Bipolar II disorder: Secondary | ICD-10-CM

## 2022-08-07 DIAGNOSIS — H6121 Impacted cerumen, right ear: Secondary | ICD-10-CM

## 2022-08-07 DIAGNOSIS — F331 Major depressive disorder, recurrent, moderate: Secondary | ICD-10-CM | POA: Diagnosis not present

## 2022-08-07 DIAGNOSIS — I10 Essential (primary) hypertension: Secondary | ICD-10-CM

## 2022-08-07 DIAGNOSIS — E1069 Type 1 diabetes mellitus with other specified complication: Secondary | ICD-10-CM | POA: Diagnosis not present

## 2022-08-07 LAB — URINALYSIS, ROUTINE W REFLEX MICROSCOPIC
Bilirubin, UA: NEGATIVE
Glucose, UA: NEGATIVE
Ketones, UA: NEGATIVE
Leukocytes,UA: NEGATIVE
Nitrite, UA: NEGATIVE
Protein,UA: NEGATIVE
RBC, UA: NEGATIVE
Specific Gravity, UA: 1.02 (ref 1.005–1.030)
Urobilinogen, Ur: 2 mg/dL — ABNORMAL HIGH (ref 0.2–1.0)
pH, UA: 6.5 (ref 5.0–7.5)

## 2022-08-07 LAB — MICROALBUMIN, URINE WAIVED
Creatinine, Urine Waived: 200 mg/dL (ref 10–300)
Microalb, Ur Waived: 10 mg/L (ref 0–19)
Microalb/Creat Ratio: <30 mg/g (ref <30)

## 2022-08-07 LAB — BAYER DCA HB A1C WAIVED: HB A1C (BAYER DCA - WAIVED): 6.4 % — ABNORMAL HIGH (ref 4.8–5.6)

## 2022-08-07 MED ORDER — FAMOTIDINE 20 MG PO TABS: ORAL_TABLET | ORAL | 1 refills | Status: DC

## 2022-08-07 MED ORDER — LISINOPRIL 5 MG PO TABS: 5.0000 mg | ORAL_TABLET | Freq: Every day | ORAL | 1 refills | Status: DC

## 2022-08-07 NOTE — Progress Notes (Signed)
BP 114/76   Pulse 70   Temp 97.7 F (36.5 C) (Oral)   Ht 5' 9.29" (1.76 m)   Wt 200 lb 6.4 oz (90.9 kg)   SpO2 95%   BMI 29.35 kg/m    Subjective:    Patient ID: Bernard [name], male    DOB: 10/09/1975, 47 y.o.   MRN: [id number]  HPI: Bernard [name] is a 47 y.o. male presenting on 08/07/2022 for comprehensive medical examination. Current medical complaints include:  DIABETES Hypoglycemic episodes:no Polydipsia/polyuria: no Visual disturbance: no Chest pain: no Paresthesias: no Glucose Monitoring: yes  Accucheck frequency: continuous Taking Insulin?: yes  Long acting insulin: lantus  Short acting insulin: novolog Blood Pressure Monitoring: rarely Retinal Examination: Up to Date Foot Exam: Up to Date Diabetic Education: Completed Pneumovax: Up to Date Influenza: Up to Date Aspirin: no  HYPERTENSION / HYPERLIPIDEMIA Satisfied with current treatment? yes Duration of hypertension: chronic BP monitoring frequency: not checking BP medication side effects: no Past BP meds: lisinopril Duration of hyperlipidemia: chronic Cholesterol medication side effects: no Cholesterol supplements: none Past cholesterol medications: atorvastatin Medication compliance: excellent compliance Aspirin: no Recent stressors: no Recurrent headaches: no Visual changes: no Palpitations: no Dyspnea: no Chest pain: no Lower extremity edema: no Dizzy/lightheaded: no  BIPOLAR Mood status: exacerbated Satisfied with current treatment?: no Symptom severity: moderate  Duration of current treatment : chronic Side effects: no Medication compliance: excellent compliance Depressed mood: yes Anxious mood: yes Anhedonia: no Significant weight loss or gain: no Insomnia: no  Fatigue: yes Feelings of worthlessness or guilt: no Impaired concentration/indecisiveness: no Suicidal ideations: no Hopelessness: no Crying spells: no    08/07/2022    2:16 PM 05/02/2022   11:28 AM  01/12/2022    3:05 PM 10/04/2021    9:14 AM 06/27/2021    8:37 AM  Depression screen PHQ 2/9  Decreased Interest 2 0 1 0 0  Down, Depressed, Hopeless 2 1 0 1 1  PHQ - 2 Score 4 1 1 1 1   Altered sleeping 0 0 2 2 0  Tired, decreased energy 2 0 0 1 1  Change in appetite 0 0 0 3 2  Feeling bad or failure about yourself  2 0 0 0 1  Trouble concentrating 0 0 0 0 0  Moving slowly or fidgety/restless 0 0 0 0 0  Suicidal thoughts 0 0 0 0 0  PHQ-9 Score 8 1 3 7 5   Difficult doing work/chores Not difficult at all Not difficult at all Somewhat difficult Not difficult at all    Interim Problems from his last visit: no  Depression Screen done today and results listed below:     08/07/2022    2:16 PM 05/02/2022   11:28 AM 01/12/2022    3:05 PM 10/04/2021    9:14 AM 06/27/2021    8:37 AM  Depression screen PHQ 2/9  Decreased Interest 2 0 1 0 0  Down, Depressed, Hopeless 2 1 0 1 1  PHQ - 2 Score 4 1 1 1 1   Altered sleeping 0 0 2 2 0  Tired, decreased energy 2 0 0 1 1  Change in appetite 0 0 0 3 2  Feeling bad or failure about yourself  2 0 0 0 1  Trouble concentrating 0 0 0 0 0  Moving slowly or fidgety/restless 0 0 0 0 0  Suicidal thoughts 0 0 0 0 0  PHQ-9 Score 8 1 3 7 5   Difficult doing work/chores Not difficult at  all Not difficult at all Somewhat difficult Not difficult at all     Past Medical History:  Past Medical History:  Diagnosis Date   Anxiety    Depression    Esophageal stricture    Hyperlipidemia    Hypertension    Weight loss     Surgical History:  Past Surgical History:  Procedure Laterality Date   PILONIDAL CYST EXCISION     WISDOM TOOTH EXTRACTION      Medications:  Current Outpatient Medications on File Prior to Visit  Medication Sig   atorvastatin (LIPITOR) 40 MG tablet TAKE (1) TABLET BY MOUTH EVERY DAY   Blood Glucose Monitoring Suppl (ONE TOUCH ULTRA 2) w/Device KIT 1 each by Does not apply route daily at 6 (six) AM.   Cetirizine HCl 10 MG CAPS Take 10  mg by mouth daily.   Continuous Blood Gluc Sensor (DEXCOM G6 SENSOR) MISC SMARTSIG:1 Each Topical Every 10 Days   Continuous Blood Gluc Transmit (DEXCOM G6 TRANSMITTER) MISC USE 1 EACH EVERY 3 (THREE) MONTHS   cyclobenzaprine (FLEXERIL) 10 MG tablet Take 1 tablet (10 mg total) by mouth at bedtime.   glucose blood (ONETOUCH ULTRA) test strip 1 each by Other route daily. Use as instructed   insulin aspart (NOVOLOG) 100 UNIT/ML injection INJECT UP TO 100 UNITS VIA PUMP EVERY DAY   Insulin Disposable Pump (OMNIPOD 5 G6 INTRO, GEN 5,) KIT Inject into the skin.   insulin glargine (LANTUS) 100 UNIT/ML Solostar Pen    Insulin Pen Needle 29G X 12.7MM MISC 1 each by Does not apply route 3 (three) times daily.   lamoTRIgine (LAMICTAL) 200 MG tablet Take 200 mg by mouth daily.   Lancets (ONETOUCH DELICA PLUS LANCET30G) MISC    Lancets (ONETOUCH ULTRASOFT) lancets 1 each by Other route daily. Use as instructed   ondansetron (ZOFRAN ODT) 4 MG disintegrating tablet Take 1 tablet (4 mg total) by mouth every 8 (eight) hours as needed for nausea or vomiting.   pantoprazole (PROTONIX) 40 MG tablet Take 1 tablet (40 mg total) by mouth 2 (two) times daily.   rOPINIRole (REQUIP) 0.5 MG tablet Take 1 tablet (0.5 mg total) by mouth at bedtime.   simethicone (GAS-X) 80 MG chewable tablet Chew 1 tablet (80 mg total) by mouth every 6 (six) hours as needed for flatulence (or cramping).   traZODone (DESYREL) 50 MG tablet Take 50 mg by mouth at bedtime as needed.   No current facility-administered medications on file prior to visit.    Allergies:  Allergies  Allergen Reactions   Chantix [Varenicline] Other (See Comments)    Insomnia , shakes    Social History:  Social History   Socioeconomic History   Marital status: Married    Spouse name: joan    Number of children: 2   Years of education: Not on file   Highest education level: Bachelor's degree (e.g., BA, AB, BS)  Occupational History   Not on file   Tobacco Use   Smoking status: Former    Packs/day: .5    Types: Cigarettes    Start date: 01/22/2017   Smokeless tobacco: Never   Tobacco comments:    1/2 of cigarettes 6 per day- 01/19/2021  Vaping Use   Vaping Use: Never used  Substance and Sexual Activity   Alcohol use: Yes    Alcohol/week: 7.0 standard drinks of alcohol    Types: 7 Cans of beer per week    Comment: 1-2 every day   Drug  use: No   Sexual activity: Yes    Birth control/protection: None  Other Topics Concern   Not on file  Social History Narrative   Not on file   Social Determinants of Health   Financial Resource Strain: Low Risk  (04/28/2022)   Overall Financial Resource Strain (CARDIA)    Difficulty of Paying Living Expenses: Not hard at all  Food Insecurity: No Food Insecurity (04/28/2022)   Hunger Vital Sign    Worried About Running Out of Food in the Last Year: Never true    Ran Out of Food in the Last Year: Never true  Transportation Needs: No Transportation Needs (04/28/2022)   PRAPARE - Administrator, Civil Service (Medical): No    Lack of Transportation (Non-Medical): No  Physical Activity: Insufficiently Active (04/28/2022)   Exercise Vital Sign    Days of Exercise per Week: 2 days    Minutes of Exercise per Session: 20 min  Stress: No Stress Concern Present (04/28/2022)   [employer] of Occupational Health - Occupational Stress Questionnaire    Feeling of Stress : Only a little  Social Connections: Moderately Integrated (04/28/2022)   Social Connection and Isolation Panel [NHANES]    Frequency of Communication with Friends and Family: More than three times a week    Frequency of Social Gatherings with Friends and Family: Once a week    Attends Religious Services: Never    Database administrator or Organizations: Yes    Attends Engineer, structural: More than 4 times per year    Marital Status: Married  Catering manager Violence: Not At Risk (01/08/2017)    Humiliation, Afraid, Rape, and Kick questionnaire    Fear of Current or Ex-Partner: No    Emotionally Abused: No    Physically Abused: No    Sexually Abused: No   Social History   Tobacco Use  Smoking Status Former   Packs/day: .5   Types: Cigarettes   Start date: 01/22/2017  Smokeless Tobacco Never  Tobacco Comments   1/2 of cigarettes 6 per day- 01/19/2021   Social History   Substance and Sexual Activity  Alcohol Use Yes   Alcohol/week: 7.0 standard drinks of alcohol   Types: 7 Cans of beer per week   Comment: 1-2 every day    Family History:  Family History  Problem Relation Age of Onset   Cancer Mother    Anxiety disorder Mother    Depression Mother    Cancer Father    Cancer Sister        some sort of intestinal cancer   Anxiety disorder Brother    Depression Brother     Past medical history, surgical history, medications, allergies, family history and social history reviewed with patient today and changes made to appropriate areas of the chart.   Review of Systems  Constitutional:  Positive for chills and malaise/fatigue. Negative for diaphoresis, fever and weight loss.  Eyes: Negative.   Respiratory: Negative.    Cardiovascular: Negative.   Gastrointestinal: Negative.   Genitourinary: Negative.   Musculoskeletal: Negative.   Skin: Negative.   Neurological: Negative.   Endo/Heme/Allergies: Negative.   Psychiatric/Behavioral:  Positive for depression. Negative for hallucinations, memory loss, substance abuse and suicidal ideas. The patient is not nervous/anxious and does not have insomnia.    All other ROS negative except what is listed above and in the HPI.      Objective:    BP 114/76   Pulse 70  Temp 97.7 F (36.5 C) (Oral)   Ht 5' 9.29" (1.76 m)   Wt 200 lb 6.4 oz (90.9 kg)   SpO2 95%   BMI 29.35 kg/m   Wt Readings from Last 3 Encounters:  08/07/22 200 lb 6.4 oz (90.9 kg)  05/02/22 219 lb 14.4 oz (99.7 kg)  01/12/22 218 lb 14.4 oz (99.3  kg)    Physical Exam Vitals and nursing note reviewed.  Constitutional:      General: He is not in acute distress.    Appearance: Normal appearance. He is obese. He is not ill-appearing, toxic-appearing or diaphoretic.  HENT:     Head: Normocephalic and atraumatic.     Right Ear: Tympanic membrane, ear canal and external ear normal. There is no impacted cerumen.     Left Ear: Tympanic membrane, ear canal and external ear normal. There is no impacted cerumen.     Nose: Nose normal. No congestion or rhinorrhea.     Mouth/Throat:     Mouth: Mucous membranes are moist.     Pharynx: Oropharynx is clear. No oropharyngeal exudate or posterior oropharyngeal erythema.  Eyes:     General: No scleral icterus.       Right eye: No discharge.        Left eye: No discharge.     Extraocular Movements: Extraocular movements intact.     Conjunctiva/sclera: Conjunctivae normal.     Pupils: Pupils are equal, round, and reactive to light.  Neck:     Vascular: No carotid bruit.  Cardiovascular:     Rate and Rhythm: Normal rate and regular rhythm.     Pulses: Normal pulses.     Heart sounds: No murmur heard.    No friction rub. No gallop.  Pulmonary:     Effort: Pulmonary effort is normal. No respiratory distress.     Breath sounds: Normal breath sounds. No stridor. No wheezing, rhonchi or rales.  Chest:     Chest wall: No tenderness.  Abdominal:     General: Abdomen is flat. Bowel sounds are normal. There is no distension.     Palpations: Abdomen is soft. There is no mass.     Tenderness: There is no abdominal tenderness. There is no right CVA tenderness, left CVA tenderness, guarding or rebound.     Hernia: No hernia is present.  Genitourinary:    Comments: Genital exam deferred with shared decision making Musculoskeletal:        General: No swelling, tenderness, deformity or signs of injury.     Cervical back: Normal range of motion and neck supple. No rigidity. No muscular tenderness.      Right lower leg: No edema.     Left lower leg: No edema.  Lymphadenopathy:     Cervical: No cervical adenopathy.  Skin:    General: Skin is warm and dry.     Capillary Refill: Capillary refill takes less than 2 seconds.     Coloration: Skin is not jaundiced or pale.     Findings: No bruising, erythema, lesion or rash.  Neurological:     General: No focal deficit present.     Mental Status: He is alert and oriented to person, place, and time.     Cranial Nerves: No cranial nerve deficit.     Sensory: No sensory deficit.     Motor: No weakness.     Coordination: Coordination normal.     Gait: Gait normal.     Deep Tendon Reflexes: Reflexes normal.  Psychiatric:        Mood and Affect: Mood normal.        Behavior: Behavior normal.        Thought Content: Thought content normal.        Judgment: Judgment normal.     Results for orders placed or performed in visit on 08/07/22  Comprehensive metabolic panel  Result Value Ref Range   Glucose 120 (H) 70 - 99 mg/dL   BUN 13 6 - 24 mg/dL   Creatinine, Ser 1.61 (H) 0.76 - 1.27 mg/dL   eGFR 59 (L) >09 UE/AVW/0.98   BUN/Creatinine Ratio 9 9 - 20   Sodium 141 134 - 144 mmol/L   Potassium 4.7 3.5 - 5.2 mmol/L   Chloride 104 96 - 106 mmol/L   CO2 18 (L) 20 - 29 mmol/L   Calcium 9.5 8.7 - 10.2 mg/dL   Total Protein 6.1 6.0 - 8.5 g/dL   Albumin 4.5 4.1 - 5.1 g/dL   Globulin, Total 1.6 1.5 - 4.5 g/dL   Bilirubin Total 0.5 0.0 - 1.2 mg/dL   Alkaline Phosphatase 87 44 - 121 IU/L   AST 27 0 - 40 IU/L   ALT 29 0 - 44 IU/L  CBC with Differential/Platelet  Result Value Ref Range   WBC 6.8 3.4 - 10.8 x10E3/uL   RBC 4.69 4.14 - 5.80 x10E6/uL   Hemoglobin 14.5 13.0 - 17.7 g/dL   Hematocrit 11.9 14.7 - 51.0 %   MCV 92 79 - 97 fL   MCH 30.9 26.6 - 33.0 pg   MCHC 33.5 31.5 - 35.7 g/dL   RDW 82.9 56.2 - 13.0 %   Platelets 220 150 - 450 x10E3/uL   Neutrophils 68 Not Estab. %   Lymphs 20 Not Estab. %   Monocytes 8 Not Estab. %   Eos 3 Not  Estab. %   Basos 1 Not Estab. %   Neutrophils Absolute 4.6 1.4 - 7.0 x10E3/uL   Lymphocytes Absolute 1.3 0.7 - 3.1 x10E3/uL   Monocytes Absolute 0.5 0.1 - 0.9 x10E3/uL   EOS (ABSOLUTE) 0.2 0.0 - 0.4 x10E3/uL   Basophils Absolute 0.0 0.0 - 0.2 x10E3/uL   Immature Granulocytes 0 Not Estab. %   Immature Grans (Abs) 0.0 0.0 - 0.1 x10E3/uL  Lipid Panel w/o Chol/HDL Ratio  Result Value Ref Range   Cholesterol, Total 138 100 - 199 mg/dL   Triglycerides 80 0 - 149 mg/dL   HDL 55 >86 mg/dL   VLDL Cholesterol Cal 16 5 - 40 mg/dL   LDL Chol Calc (NIH) 67 0 - 99 mg/dL  PSA  Result Value Ref Range   Prostate Specific Ag, Serum 0.2 0.0 - 4.0 ng/mL  TSH  Result Value Ref Range   TSH 1.350 0.450 - 4.500 uIU/mL  Urinalysis, Routine w reflex microscopic  Result Value Ref Range   Specific Gravity, UA 1.020 1.005 - 1.030   pH, UA 6.5 5.0 - 7.5   Color, UA Yellow Yellow   Appearance Ur Clear Clear   Leukocytes,UA Negative Negative   Protein,UA Negative Negative/Trace   Glucose, UA Negative Negative   Ketones, UA Negative Negative   RBC, UA Negative Negative   Bilirubin, UA Negative Negative   Urobilinogen, Ur 2.0 (H) 0.2 - 1.0 mg/dL   Nitrite, UA Negative Negative   Microscopic Examination Comment   Microalbumin, Urine Waived  Result Value Ref Range   Microalb, Ur Waived 10 0 - 19 mg/L   Creatinine, Urine Waived 200  10 - 300 mg/dL   Microalb/Creat Ratio <30 <30 mg/g  Lamotrigine level  Result Value Ref Range   Lamotrigine Lvl 4.7 2.0 - 20.0 ug/mL  Bayer DCA Hb A1c Waived  Result Value Ref Range   HB A1C (BAYER DCA - WAIVED) 6.4 (H) 4.8 - 5.6 %      Assessment & Plan:   Problem List Items Addressed This Visit       Cardiovascular and Mediastinum   Hypertension    Under good control on current regimen. Continue current regimen. Continue to monitor. Call with any concerns. Refills given. Labs drawn today       Relevant Medications   lisinopril (ZESTRIL) 5 MG tablet   Other  Relevant Orders   Microalbumin, Urine Waived (Completed)     Endocrine   Hyperlipidemia due to type 1 diabetes mellitus (HCC)    Under good control on current regimen. Continue current regimen. Continue to monitor. Call with any concerns. Refills given. Labs drawn today.        Relevant Medications   lisinopril (ZESTRIL) 5 MG tablet   Diabetic retinopathy associated with type 1 diabetes mellitus (HCC)    Under good control with A1c of 6.4. Continue to follow with endocrinology. Call with any concerns. Continue to monitor.       Relevant Medications   lisinopril (ZESTRIL) 5 MG tablet   Other Relevant Orders   Microalbumin, Urine Waived (Completed)   Bayer DCA Hb A1c Waived (Completed)     Other   Depression, recurrent (HCC)    Continue to follow with psychiatry. Acting up a bit. Continue to monitor. Call with any concerns.       Hyperlipidemia    Under good control on current regimen. Continue current regimen. Continue to monitor. Call with any concerns. Refills given. Labs drawn today.        Relevant Medications   lisinopril (ZESTRIL) 5 MG tablet   Bipolar 2 disorder (HCC)    Continue to follow with psychiatry. Acting up a bit. Continue to monitor. Call with any concerns.       Seizure-like activity (HCC)    Continue to follow with neurology. Labs drawn today. Await results. Treat as needed.       Relevant Orders   Lamotrigine level (Completed)   Other Visit Diagnoses     Routine general medical examination at a health care facility    -  Primary   Vaccines up to date. Screening labs checked today. Cologuard up to date. Continue diet and exercise. Call with any concerns.   Relevant Orders   Comprehensive metabolic panel (Completed)   CBC with Differential/Platelet (Completed)   Lipid Panel w/o Chol/HDL Ratio (Completed)   PSA (Completed)   TSH (Completed)   Urinalysis, Routine w reflex microscopic (Completed)   Hearing loss of right ear due to cerumen impaction        Unable to flush today. Will use debrox and return in a week for nurse visit.   Change in facial mole       Referral to dermatology due to location. Call with any concerns.   Relevant Orders   Ambulatory referral to Dermatology        LABORATORY TESTING:  Health maintenance labs ordered today as discussed above.   The natural history of prostate cancer and ongoing controversy regarding screening and potential treatment outcomes of prostate cancer has been discussed with the patient. The meaning of a false positive PSA and a false negative PSA  has been discussed. He indicates understanding of the limitations of this screening test and wishes to proceed with screening PSA testing.   IMMUNIZATIONS:   - Tdap: Tetanus vaccination status reviewed: last tetanus booster within 10 years. - Influenza: Postponed to flu season - Pneumovax: Up to date - Prevnar: Not applicable - COVID: Refused - HPV: Not applicable - Shingrix vaccine: Not applicable  SCREENING: - Colonoscopy: Up to date  Discussed with patient purpose of the colonoscopy is to detect colon cancer at curable precancerous or early stages   PATIENT COUNSELING:    Sexuality: Discussed sexually transmitted diseases, partner selection, use of condoms, avoidance of unintended pregnancy  and contraceptive alternatives.   Advised to avoid cigarette smoking.  I discussed with the patient that most people either abstain from alcohol or drink within safe limits (<=14/week and <=4 drinks/occasion for males, <=7/weeks and <= 3 drinks/occasion for females) and that the risk for alcohol disorders and other health effects rises proportionally with the number of drinks per week and how often a drinker exceeds daily limits.  Discussed cessation/primary prevention of drug use and availability of treatment for abuse.   Diet: Encouraged to adjust caloric intake to maintain  or achieve ideal body weight, to reduce intake of dietary saturated  fat and total fat, to limit sodium intake by avoiding high sodium foods and not adding table salt, and to maintain adequate dietary potassium and calcium preferably from fresh fruits, vegetables, and low-fat dairy products.    stressed the importance of regular exercise  Injury prevention: Discussed safety belts, safety helmets, smoke detector, smoking near bedding or upholstery.   Dental health: Discussed importance of regular tooth brushing, flossing, and dental visits.   Follow up plan: NEXT PREVENTATIVE PHYSICAL DUE IN 1 YEAR. Return in about 3 months (around 11/07/2022).

## 2022-08-08 ENCOUNTER — Other Ambulatory Visit: Payer: Self-pay | Admitting: Family Medicine

## 2022-08-08 DIAGNOSIS — N289 Disorder of kidney and ureter, unspecified: Secondary | ICD-10-CM

## 2022-08-08 LAB — CBC WITH DIFFERENTIAL/PLATELET
Basophils Absolute: 0 10*3/uL (ref 0.0–0.2)
Basos: 1 %
EOS (ABSOLUTE): 0.2 10*3/uL (ref 0.0–0.4)
Eos: 3 %
Hematocrit: 43.3 % (ref 37.5–51.0)
Hemoglobin: 14.5 g/dL (ref 13.0–17.7)
Immature Grans (Abs): 0 10*3/uL (ref 0.0–0.1)
Immature Granulocytes: 0 %
Lymphocytes Absolute: 1.3 10*3/uL (ref 0.7–3.1)
Lymphs: 20 %
MCH: 30.9 pg (ref 26.6–33.0)
MCHC: 33.5 g/dL (ref 31.5–35.7)
MCV: 92 fL (ref 79–97)
Monocytes Absolute: 0.5 10*3/uL (ref 0.1–0.9)
Monocytes: 8 %
Neutrophils Absolute: 4.6 10*3/uL (ref 1.4–7.0)
Neutrophils: 68 %
Platelets: 220 10*3/uL (ref 150–450)
RBC: 4.69 x10E6/uL (ref 4.14–5.80)
RDW: 12.6 % (ref 11.6–15.4)
WBC: 6.8 10*3/uL (ref 3.4–10.8)

## 2022-08-08 LAB — PSA: Prostate Specific Ag, Serum: 0.2 ng/mL (ref 0.0–4.0)

## 2022-08-08 LAB — COMPREHENSIVE METABOLIC PANEL
ALT: 29 IU/L (ref 0–44)
AST: 27 IU/L (ref 0–40)
Albumin: 4.5 g/dL (ref 4.1–5.1)
Alkaline Phosphatase: 87 IU/L (ref 44–121)
BUN/Creatinine Ratio: 9 (ref 9–20)
BUN: 13 mg/dL (ref 6–24)
Bilirubin Total: 0.5 mg/dL (ref 0.0–1.2)
CO2: 18 mmol/L — ABNORMAL LOW (ref 20–29)
Calcium: 9.5 mg/dL (ref 8.7–10.2)
Chloride: 104 mmol/L (ref 96–106)
Creatinine, Ser: 1.48 mg/dL — ABNORMAL HIGH (ref 0.76–1.27)
Globulin, Total: 1.6 g/dL (ref 1.5–4.5)
Glucose: 120 mg/dL — ABNORMAL HIGH (ref 70–99)
Potassium: 4.7 mmol/L (ref 3.5–5.2)
Sodium: 141 mmol/L (ref 134–144)
Total Protein: 6.1 g/dL (ref 6.0–8.5)
eGFR: 59 mL/min/{1.73_m2} — ABNORMAL LOW (ref >59)

## 2022-08-08 LAB — LAMOTRIGINE LEVEL: Lamotrigine Lvl: 4.7 ug/mL (ref 2.0–20.0)

## 2022-08-08 LAB — TSH: TSH: 1.35 u[IU]/mL (ref 0.450–4.500)

## 2022-08-08 LAB — LIPID PANEL W/O CHOL/HDL RATIO
Cholesterol, Total: 138 mg/dL (ref 100–199)
HDL: 55 mg/dL (ref >39)
LDL Chol Calc (NIH): 67 mg/dL (ref 0–99)
Triglycerides: 80 mg/dL (ref 0–149)
VLDL Cholesterol Cal: 16 mg/dL (ref 5–40)

## 2022-08-10 NOTE — Assessment & Plan Note (Signed)
Continue to follow with neurology. Labs drawn today. Await results. Treat as needed.

## 2022-08-10 NOTE — Assessment & Plan Note (Signed)
Under good control on current regimen. Continue current regimen. Continue to monitor. Call with any concerns. Refills given. Labs drawn today.   

## 2022-08-10 NOTE — Assessment & Plan Note (Signed)
Under good control with A1c of 6.4. Continue to follow with endocrinology. Call with any concerns. Continue to monitor.

## 2022-08-10 NOTE — Assessment & Plan Note (Signed)
Continue to follow with psychiatry. Acting up a bit. Continue to monitor. Call with any concerns.

## 2022-08-10 NOTE — Assessment & Plan Note (Signed)
Continue to follow with psychiatry. Acting up a bit. Continue to monitor. Call with any concerns.  

## 2022-08-15 DIAGNOSIS — F3161 Bipolar disorder, current episode mixed, mild: Secondary | ICD-10-CM | POA: Diagnosis not present

## 2022-08-15 DIAGNOSIS — F411 Generalized anxiety disorder: Secondary | ICD-10-CM | POA: Diagnosis not present

## 2022-08-22 ENCOUNTER — Other Ambulatory Visit: Payer: Self-pay | Admitting: Family Medicine

## 2022-08-22 ENCOUNTER — Other Ambulatory Visit: Payer: BC Managed Care – PPO

## 2022-08-22 DIAGNOSIS — N289 Disorder of kidney and ureter, unspecified: Secondary | ICD-10-CM

## 2022-08-23 LAB — BASIC METABOLIC PANEL
BUN/Creatinine Ratio: 16 (ref 9–20)
BUN: 16 mg/dL (ref 6–24)
CO2: 24 mmol/L (ref 20–29)
Calcium: 9.6 mg/dL (ref 8.7–10.2)
Chloride: 102 mmol/L (ref 96–106)
Creatinine, Ser: 1.03 mg/dL (ref 0.76–1.27)
Glucose: 151 mg/dL — ABNORMAL HIGH (ref 70–99)
Potassium: 4.3 mmol/L (ref 3.5–5.2)
Sodium: 140 mmol/L (ref 134–144)
eGFR: 91 mL/min/{1.73_m2} (ref >59)

## 2022-08-24 NOTE — Progress Notes (Signed)
Contacted via MyChart   Good evening Bernard [name], kidney function much better this check and in normal range.  Please ensure to hydrate well and avoid too much Ibuprofen use. Great job!!

## 2022-08-27 DIAGNOSIS — F411 Generalized anxiety disorder: Secondary | ICD-10-CM | POA: Diagnosis not present

## 2022-08-27 DIAGNOSIS — F3161 Bipolar disorder, current episode mixed, mild: Secondary | ICD-10-CM | POA: Diagnosis not present

## 2022-08-31 DIAGNOSIS — E785 Hyperlipidemia, unspecified: Secondary | ICD-10-CM | POA: Diagnosis not present

## 2022-08-31 DIAGNOSIS — E109 Type 1 diabetes mellitus without complications: Secondary | ICD-10-CM | POA: Diagnosis not present

## 2022-08-31 DIAGNOSIS — I152 Hypertension secondary to endocrine disorders: Secondary | ICD-10-CM | POA: Diagnosis not present

## 2022-08-31 DIAGNOSIS — E1069 Type 1 diabetes mellitus with other specified complication: Secondary | ICD-10-CM | POA: Diagnosis not present

## 2022-09-04 DIAGNOSIS — F331 Major depressive disorder, recurrent, moderate: Secondary | ICD-10-CM | POA: Diagnosis not present

## 2022-09-04 DIAGNOSIS — F411 Generalized anxiety disorder: Secondary | ICD-10-CM | POA: Diagnosis not present

## 2022-09-10 ENCOUNTER — Encounter: Payer: Self-pay | Admitting: Family Medicine

## 2022-09-10 DIAGNOSIS — F3161 Bipolar disorder, current episode mixed, mild: Secondary | ICD-10-CM | POA: Diagnosis not present

## 2022-09-10 DIAGNOSIS — F411 Generalized anxiety disorder: Secondary | ICD-10-CM | POA: Diagnosis not present

## 2022-09-18 NOTE — Telephone Encounter (Signed)
Rxs should not be due- please check with pharmacy as to what's going on

## 2022-09-24 ENCOUNTER — Other Ambulatory Visit: Payer: Self-pay | Admitting: Family Medicine

## 2022-09-24 DIAGNOSIS — E7800 Pure hypercholesterolemia, unspecified: Secondary | ICD-10-CM

## 2022-09-24 DIAGNOSIS — I10 Essential (primary) hypertension: Secondary | ICD-10-CM

## 2022-09-24 NOTE — Telephone Encounter (Signed)
Medication Refill - Medication:  Atorvastatin tabs 40 mg Ropinirole 0.5 mg Pantoprazole 40 mg Lisinopril 5 mg  Reference # [id number]  Has the patient contacted their pharmacy? Yes.   (Agent: If no, request that the patient contact the pharmacy for the refill. If patient does not wish to contact the pharmacy document the reason why and proceed with request.) (Agent: If yes, when and what did the pharmacy advise?)  Preferred Pharmacy (with phone number or street name): [facility] Has the patient been seen for an appointment in the last year OR does the patient have an upcoming appointment? Yes.    Agent: Please be advised that RX refills may take up to 3 business days. We ask that you follow-up with your pharmacy.

## 2022-09-25 DIAGNOSIS — F411 Generalized anxiety disorder: Secondary | ICD-10-CM | POA: Diagnosis not present

## 2022-09-25 DIAGNOSIS — F39 Unspecified mood [affective] disorder: Secondary | ICD-10-CM | POA: Diagnosis not present

## 2022-09-25 MED ORDER — PANTOPRAZOLE SODIUM 40 MG PO TBEC: 40.0000 mg | DELAYED_RELEASE_TABLET | Freq: Two times a day (BID) | ORAL | 0 refills | Status: DC

## 2022-09-25 MED ORDER — ATORVASTATIN CALCIUM 40 MG PO TABS
ORAL_TABLET | ORAL | 1 refills | Status: DC
Start: 2022-09-25 — End: 2022-11-12

## 2022-09-25 MED ORDER — ROPINIROLE HCL 0.5 MG PO TABS: 0.5000 mg | ORAL_TABLET | Freq: Every day | ORAL | 0 refills | Status: DC

## 2022-09-25 MED ORDER — LISINOPRIL 5 MG PO TABS
5.0000 mg | ORAL_TABLET | Freq: Every day | ORAL | 0 refills | Status: DC
Start: 2022-09-25 — End: 2022-11-12

## 2022-09-25 NOTE — Telephone Encounter (Signed)
Pharmacy change- mail order- remainder of refills forwarded to new pharmacy Requested Prescriptions  Pending Prescriptions Disp Refills   atorvastatin (LIPITOR) 40 MG tablet 90 tablet 3    Sig: TAKE (1) TABLET BY MOUTH EVERY DAY     Cardiovascular:  Antilipid - Statins Failed - 09/24/2022 10:08 AM      Failed - Lipid Panel in normal range within the last 12 months    Cholesterol, Total  Date Value Ref Range Status  08/07/2022 138 100 - 199 mg/dL Final   Cholesterol Piccolo, Waived  Date Value Ref Range Status  08/06/2016 151 <200 mg/dL Final    Comment:                            Desirable                <200                         Borderline High      200- 239                         High                     >239    LDL Chol Calc (NIH)  Date Value Ref Range Status  08/07/2022 67 0 - 99 mg/dL Final   HDL  Date Value Ref Range Status  08/07/2022 55 >39 mg/dL Final   Triglycerides  Date Value Ref Range Status  08/07/2022 80 0 - 149 mg/dL Final   Triglycerides Piccolo,Waived  Date Value Ref Range Status  08/06/2016 66 <150 mg/dL Final    Comment:                            Normal                   <150                         Borderline High     150 - 199                         High                200 - 499                         Very High                >499          Passed - Patient is not pregnant      Passed - Valid encounter within last 12 months    Recent Outpatient Visits           1 month ago Routine general medical examination at a health care facility   [facility], Megan P, DO   4 months ago 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, unspecified retinopathy severity (HCC)   [place] [facility] [place], Megan P, DO   8 months ago 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, unspecified retinopathy severity (HCC)   [place] [facility], Megan P, DO   8 months ago 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     Cardiovascular:  ACE Inhibitors Passed - 09/24/2022 10:08 AM      Passed - Cr in normal range and within 180 days    Creatinine  Date Value Ref Range Status  05/24/2014 0.79 mg/dL Final    Comment:    1.61-0.96 NOTE: New Reference Range  04/13/14    Creatinine, Ser  Date Value Ref Range Status  08/22/2022 1.03 0.76 - 1.27 mg/dL Final         Passed - K in normal range and within 180 days    Potassium  Date Value Ref Range Status  08/22/2022 4.3 3.5 - 5.2 mmol/L Final  05/24/2014 4.3 mmol/L Final    Comment:    3.5-5.1 NOTE: New Reference Range  04/13/14          Passed - Patient is not pregnant      Passed - Last BP in normal range    BP Readings from Last 1  Encounters:  08/07/22 114/76         Passed - Valid encounter within last 6 months    Recent Outpatient Visits           1 month ago Routine general medical examination at a health care facility   [facility] P, DO   4 months ago 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, unspecified retinopathy severity (HCC)   [facility] [place], Megan P, DO   8 months ago 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

## 2022-10-09 DIAGNOSIS — F3161 Bipolar disorder, current episode mixed, mild: Secondary | ICD-10-CM | POA: Diagnosis not present

## 2022-10-09 DIAGNOSIS — F411 Generalized anxiety disorder: Secondary | ICD-10-CM | POA: Diagnosis not present

## 2022-10-11 DIAGNOSIS — F39 Unspecified mood [affective] disorder: Secondary | ICD-10-CM | POA: Diagnosis not present

## 2022-10-11 DIAGNOSIS — F411 Generalized anxiety disorder: Secondary | ICD-10-CM | POA: Diagnosis not present

## 2022-10-16 DIAGNOSIS — F411 Generalized anxiety disorder: Secondary | ICD-10-CM | POA: Diagnosis not present

## 2022-10-16 DIAGNOSIS — F331 Major depressive disorder, recurrent, moderate: Secondary | ICD-10-CM | POA: Diagnosis not present

## 2022-10-26 DIAGNOSIS — F411 Generalized anxiety disorder: Secondary | ICD-10-CM | POA: Diagnosis not present

## 2022-10-26 DIAGNOSIS — F3161 Bipolar disorder, current episode mixed, mild: Secondary | ICD-10-CM | POA: Diagnosis not present

## 2022-10-31 DIAGNOSIS — F411 Generalized anxiety disorder: Secondary | ICD-10-CM | POA: Diagnosis not present

## 2022-10-31 DIAGNOSIS — F39 Unspecified mood [affective] disorder: Secondary | ICD-10-CM | POA: Diagnosis not present

## 2022-11-09 DIAGNOSIS — F411 Generalized anxiety disorder: Secondary | ICD-10-CM | POA: Diagnosis not present

## 2022-11-09 DIAGNOSIS — F3161 Bipolar disorder, current episode mixed, mild: Secondary | ICD-10-CM | POA: Diagnosis not present

## 2022-11-12 ENCOUNTER — Ambulatory Visit (INDEPENDENT_AMBULATORY_CARE_PROVIDER_SITE_OTHER): Payer: BC Managed Care – PPO | Admitting: Family Medicine

## 2022-11-12 ENCOUNTER — Encounter: Payer: Self-pay | Admitting: Family Medicine

## 2022-11-12 VITALS — BP 97/66 | HR 96 | Ht 69.0 in | Wt 190.0 lb

## 2022-11-12 DIAGNOSIS — E10319 Type 1 diabetes mellitus with unspecified diabetic retinopathy without macular edema: Secondary | ICD-10-CM | POA: Diagnosis not present

## 2022-11-12 DIAGNOSIS — Z23 Encounter for immunization: Secondary | ICD-10-CM

## 2022-11-12 DIAGNOSIS — E7800 Pure hypercholesterolemia, unspecified: Secondary | ICD-10-CM

## 2022-11-12 DIAGNOSIS — I10 Essential (primary) hypertension: Secondary | ICD-10-CM | POA: Diagnosis not present

## 2022-11-12 DIAGNOSIS — M25531 Pain in right wrist: Secondary | ICD-10-CM

## 2022-11-12 MED ORDER — PANTOPRAZOLE SODIUM 40 MG PO TBEC: 40.0000 mg | DELAYED_RELEASE_TABLET | Freq: Two times a day (BID) | ORAL | 1 refills | Status: DC

## 2022-11-12 MED ORDER — LISINOPRIL 2.5 MG PO TABS
2.5000 mg | ORAL_TABLET | Freq: Every day | ORAL | 1 refills | Status: DC
Start: 2022-11-12 — End: 2023-04-24

## 2022-11-12 MED ORDER — ATORVASTATIN CALCIUM 40 MG PO TABS
ORAL_TABLET | ORAL | 1 refills | Status: DC
Start: 2022-11-12 — End: 2023-06-06

## 2022-11-12 MED ORDER — FAMOTIDINE 20 MG PO TABS: ORAL_TABLET | ORAL | 1 refills | Status: DC

## 2022-11-12 MED ORDER — ROPINIROLE HCL 0.5 MG PO TABS: 0.5000 mg | ORAL_TABLET | Freq: Every day | ORAL | 1 refills | Status: DC

## 2022-11-12 NOTE — Assessment & Plan Note (Signed)
Rechecking labs today. Await results. Treat as needed.  °

## 2022-11-12 NOTE — Progress Notes (Signed)
BP 97/66   Pulse 96   Ht 5\' 9"  (1.753 m)   Wt 190 lb (86.2 kg)   SpO2 95%   BMI 28.06 kg/m    Subjective:    Patient ID: Bernard [name], male    DOB: 29-Feb-1975, 47 y.o.   MRN: [id number]  HPI: Bernard [name] is a 47 y.o. male  Chief Complaint  Patient presents with   Diabetes   Wrist Pain    Patient says he has noticed some weakness in his R arm and wrist area. Patient says it is minor but he no longer thinks it is muscular.    DIABETES Hypoglycemic episodes:no Polydipsia/polyuria: no Visual disturbance: no Chest pain: no Paresthesias: no Glucose Monitoring: yes  Accucheck frequency: continuous Taking Insulin?: yes Blood Pressure Monitoring: not checking Retinal Examination: Up to Date Foot Exam: Up to Date Diabetic Education: Completed Pneumovax: Up to Date Influenza: Given today Aspirin: no  WRIST PAIN  Duration: couple of weeks Involved wrist: right Mechanism of injury:  unknown- was working on a couch with his mouse a couple of weeks ago Location: diffuse Onset: sudden Severity: moderate  Quality:  aching and weak Frequency: constant Radiation: no Aggravating factors: unknown  Alleviating factors: unknown  Status: stable Treatments attempted: none    Relief with NSAIDs?:  No NSAIDs Taken Weakness: yes Numbness: no  Redness: no Bruising: no Swelling: no Fevers: no  Relevant past medical, surgical, family and social history reviewed and updated as indicated. Interim medical history since our last visit reviewed. Allergies and medications reviewed and updated.  Review of Systems  Constitutional: Negative.   Respiratory: Negative.    Cardiovascular: Negative.   Musculoskeletal: Negative.   Neurological: Negative.   Psychiatric/Behavioral: Negative.      Per HPI unless specifically indicated above     Objective:    BP 97/66   Pulse 96   Ht 5\' 9"  (1.753 m)   Wt 190 lb (86.2 kg)   SpO2 95%   BMI 28.06 kg/m   Wt  Readings from Last 3 Encounters:  11/12/22 190 lb (86.2 kg)  08/07/22 200 lb 6.4 oz (90.9 kg)  05/02/22 219 lb 14.4 oz (99.7 kg)    Physical Exam Vitals and nursing note reviewed.  Constitutional:      General: He is not in acute distress.    Appearance: Normal appearance. He is not ill-appearing, toxic-appearing or diaphoretic.  HENT:     Head: Normocephalic and atraumatic.     Right Ear: External ear normal.     Left Ear: External ear normal.     Nose: Nose normal.     Mouth/Throat:     Mouth: Mucous membranes are moist.     Pharynx: Oropharynx is clear.  Eyes:     General: No scleral icterus.       Right eye: No discharge.        Left eye: No discharge.     Extraocular Movements: Extraocular movements intact.     Conjunctiva/sclera: Conjunctivae normal.     Pupils: Pupils are equal, round, and reactive to light.  Cardiovascular:     Rate and Rhythm: Normal rate and regular rhythm.     Pulses: Normal pulses.     Heart sounds: Normal heart sounds. No murmur heard.    No friction rub. No gallop.  Pulmonary:     Effort: Pulmonary effort is normal. No respiratory distress.     Breath sounds: Normal breath sounds. No stridor. No wheezing, rhonchi  or rales.  Chest:     Chest wall: No tenderness.  Musculoskeletal:        General: Normal range of motion.     Cervical back: Normal range of motion and neck supple.  Skin:    General: Skin is warm and dry.     Capillary Refill: Capillary refill takes less than 2 seconds.     Coloration: Skin is not jaundiced or pale.     Findings: No bruising, erythema, lesion or rash.  Neurological:     General: No focal deficit present.     Mental Status: He is alert and oriented to person, place, and time. Mental status is at baseline.  Psychiatric:        Mood and Affect: Mood normal.        Behavior: Behavior normal.        Thought Content: Thought content normal.        Judgment: Judgment normal.     Results for orders placed or  performed in visit on 08/22/22  Basic metabolic panel  Result Value Ref Range   Glucose 151 (H) 70 - 99 mg/dL   BUN 16 6 - 24 mg/dL   Creatinine, Ser 7.25 0.76 - 1.27 mg/dL   eGFR 91 >36 UY/QIH/4.74   BUN/Creatinine Ratio 16 9 - 20   Sodium 140 134 - 144 mmol/L   Potassium 4.3 3.5 - 5.2 mmol/L   Chloride 102 96 - 106 mmol/L   CO2 24 20 - 29 mmol/L   Calcium 9.6 8.7 - 10.2 mg/dL      Assessment & Plan:   Problem List Items Addressed This Visit       Cardiovascular and Mediastinum   Hypertension   Relevant Medications   lisinopril (ZESTRIL) 2.5 MG tablet   atorvastatin (LIPITOR) 40 MG tablet     Endocrine   Diabetic retinopathy associated with type 1 diabetes mellitus (HCC) - Primary    Rechecking labs today. Await results. Treat as needed.      Relevant Medications   lisinopril (ZESTRIL) 2.5 MG tablet   atorvastatin (LIPITOR) 40 MG tablet   Other Relevant Orders   Hgb A1c w/o eAG     Other   Hyperlipidemia   Relevant Medications   lisinopril (ZESTRIL) 2.5 MG tablet   atorvastatin (LIPITOR) 40 MG tablet   Other Visit Diagnoses     Right wrist pain       Will start on stretches. Call with any concerns.   Needs flu shot       Flu shot given today.   Relevant Orders   Flu vaccine trivalent PF, 6mos and older(Flulaval,Afluria,Fluarix,Fluzone)   Need for COVID-19 vaccine       COVID shot given today.   Relevant Orders   Pfizer Comirnaty Covid -19 Vaccine 65yrs and older        Follow up plan: Return in about 3 months (around 02/12/2023).

## 2022-11-13 LAB — HGB A1C W/O EAG: Hgb A1c MFr Bld: 7.3 % — ABNORMAL HIGH (ref 4.8–5.6)

## 2022-11-18 DIAGNOSIS — F411 Generalized anxiety disorder: Secondary | ICD-10-CM | POA: Diagnosis not present

## 2022-11-18 DIAGNOSIS — F39 Unspecified mood [affective] disorder: Secondary | ICD-10-CM | POA: Diagnosis not present

## 2022-11-23 DIAGNOSIS — F411 Generalized anxiety disorder: Secondary | ICD-10-CM | POA: Diagnosis not present

## 2022-11-23 DIAGNOSIS — F3161 Bipolar disorder, current episode mixed, mild: Secondary | ICD-10-CM | POA: Diagnosis not present

## 2022-11-27 DIAGNOSIS — F331 Major depressive disorder, recurrent, moderate: Secondary | ICD-10-CM | POA: Diagnosis not present

## 2022-11-27 DIAGNOSIS — F411 Generalized anxiety disorder: Secondary | ICD-10-CM | POA: Diagnosis not present

## 2022-12-10 LAB — HM DIABETES EYE EXAM

## 2022-12-11 DIAGNOSIS — F411 Generalized anxiety disorder: Secondary | ICD-10-CM | POA: Diagnosis not present

## 2022-12-11 DIAGNOSIS — F331 Major depressive disorder, recurrent, moderate: Secondary | ICD-10-CM | POA: Diagnosis not present

## 2022-12-12 DIAGNOSIS — F39 Unspecified mood [affective] disorder: Secondary | ICD-10-CM | POA: Diagnosis not present

## 2022-12-12 DIAGNOSIS — F411 Generalized anxiety disorder: Secondary | ICD-10-CM | POA: Diagnosis not present

## 2022-12-12 DIAGNOSIS — F3161 Bipolar disorder, current episode mixed, mild: Secondary | ICD-10-CM | POA: Diagnosis not present

## 2022-12-24 DIAGNOSIS — E109 Type 1 diabetes mellitus without complications: Secondary | ICD-10-CM | POA: Diagnosis not present

## 2022-12-24 DIAGNOSIS — F411 Generalized anxiety disorder: Secondary | ICD-10-CM | POA: Diagnosis not present

## 2022-12-24 DIAGNOSIS — E1069 Type 1 diabetes mellitus with other specified complication: Secondary | ICD-10-CM | POA: Diagnosis not present

## 2022-12-24 DIAGNOSIS — F3161 Bipolar disorder, current episode mixed, mild: Secondary | ICD-10-CM | POA: Diagnosis not present

## 2022-12-24 DIAGNOSIS — I152 Hypertension secondary to endocrine disorders: Secondary | ICD-10-CM | POA: Diagnosis not present

## 2022-12-24 DIAGNOSIS — E785 Hyperlipidemia, unspecified: Secondary | ICD-10-CM | POA: Diagnosis not present

## 2022-12-25 DIAGNOSIS — F331 Major depressive disorder, recurrent, moderate: Secondary | ICD-10-CM | POA: Diagnosis not present

## 2022-12-25 DIAGNOSIS — F411 Generalized anxiety disorder: Secondary | ICD-10-CM | POA: Diagnosis not present

## 2022-12-31 DIAGNOSIS — D492 Neoplasm of unspecified behavior of bone, soft tissue, and skin: Secondary | ICD-10-CM | POA: Diagnosis not present

## 2023-01-08 DIAGNOSIS — F411 Generalized anxiety disorder: Secondary | ICD-10-CM | POA: Diagnosis not present

## 2023-01-08 DIAGNOSIS — F331 Major depressive disorder, recurrent, moderate: Secondary | ICD-10-CM | POA: Diagnosis not present

## 2023-01-16 ENCOUNTER — Encounter: Payer: Self-pay | Admitting: Family Medicine

## 2023-01-17 MED ORDER — NICOTINE 21 MG/24HR TD PT24: 21.0000 mg | MEDICATED_PATCH | Freq: Every day | TRANSDERMAL | 0 refills | Status: DC

## 2023-01-17 NOTE — Addendum Note (Signed)
Addended by: Dorcas Carrow on: 01/17/2023 12:46 PM   Modules accepted: Orders

## 2023-01-22 DIAGNOSIS — F331 Major depressive disorder, recurrent, moderate: Secondary | ICD-10-CM | POA: Diagnosis not present

## 2023-01-22 DIAGNOSIS — F411 Generalized anxiety disorder: Secondary | ICD-10-CM | POA: Diagnosis not present

## 2023-01-25 DIAGNOSIS — F3161 Bipolar disorder, current episode mixed, mild: Secondary | ICD-10-CM | POA: Diagnosis not present

## 2023-01-25 DIAGNOSIS — F411 Generalized anxiety disorder: Secondary | ICD-10-CM | POA: Diagnosis not present

## 2023-02-05 DIAGNOSIS — F331 Major depressive disorder, recurrent, moderate: Secondary | ICD-10-CM | POA: Diagnosis not present

## 2023-02-05 DIAGNOSIS — F411 Generalized anxiety disorder: Secondary | ICD-10-CM | POA: Diagnosis not present

## 2023-02-07 DIAGNOSIS — F411 Generalized anxiety disorder: Secondary | ICD-10-CM | POA: Diagnosis not present

## 2023-02-07 DIAGNOSIS — F39 Unspecified mood [affective] disorder: Secondary | ICD-10-CM | POA: Diagnosis not present

## 2023-02-08 DIAGNOSIS — F3161 Bipolar disorder, current episode mixed, mild: Secondary | ICD-10-CM | POA: Diagnosis not present

## 2023-02-08 DIAGNOSIS — F411 Generalized anxiety disorder: Secondary | ICD-10-CM | POA: Diagnosis not present

## 2023-02-12 ENCOUNTER — Ambulatory Visit (INDEPENDENT_AMBULATORY_CARE_PROVIDER_SITE_OTHER): Payer: BC Managed Care – PPO | Admitting: Family Medicine

## 2023-02-12 ENCOUNTER — Encounter: Payer: Self-pay | Admitting: Family Medicine

## 2023-02-12 VITALS — BP 107/71 | HR 92 | Wt 195.2 lb

## 2023-02-12 DIAGNOSIS — E10319 Type 1 diabetes mellitus with unspecified diabetic retinopathy without macular edema: Secondary | ICD-10-CM | POA: Diagnosis not present

## 2023-02-12 DIAGNOSIS — Z79899 Other long term (current) drug therapy: Secondary | ICD-10-CM | POA: Diagnosis not present

## 2023-02-12 DIAGNOSIS — E785 Hyperlipidemia, unspecified: Secondary | ICD-10-CM

## 2023-02-12 DIAGNOSIS — E1069 Type 1 diabetes mellitus with other specified complication: Secondary | ICD-10-CM | POA: Diagnosis not present

## 2023-02-12 DIAGNOSIS — I10 Essential (primary) hypertension: Secondary | ICD-10-CM | POA: Diagnosis not present

## 2023-02-12 LAB — BAYER DCA HB A1C WAIVED: HB A1C (BAYER DCA - WAIVED): 6.9 % — ABNORMAL HIGH (ref 4.8–5.6)

## 2023-02-12 NOTE — Assessment & Plan Note (Signed)
 Doing well with A1c of 6.9 down from 7.3. Continue current regimen. Call with any concerns. Recheck 3 months.

## 2023-02-12 NOTE — Progress Notes (Signed)
 BP 107/71   Pulse 92   Wt 195 lb 3.2 oz (88.5 kg)   SpO2 95%   BMI 28.83 kg/m    Subjective:    Patient ID: Bernard [name], male    DOB: Oct 14, 1975, 48 y.o.   MRN: [id number]  HPI: Bernard [name] is a 48 y.o. male  Chief Complaint  Patient presents with   Diabetes    Patient most recent Diabetic Eye Exam was requested at today's visit.    Hypertension   Hyperlipidemia   DIABETES Hypoglycemic episodes:no Polydipsia/polyuria: no Visual disturbance: no Chest pain: no Paresthesias: no Glucose Monitoring: yes  Accucheck frequency: continuous Taking Insulin ?: yes Blood Pressure Monitoring: rarely Retinal Examination: Up to Date Foot Exam: Up to Date Diabetic Education: Completed Pneumovax: Up to Date Influenza: Up to Date Aspirin: no  HYPERTENSION / HYPERLIPIDEMIA Satisfied with current treatment? yes Duration of hypertension: chronic BP monitoring frequency: not checking BP medication side effects: no Past BP meds: lisinopril  Duration of hyperlipidemia: years Cholesterol medication side effects: no Cholesterol supplements: none Past cholesterol medications: atorvastatin  Medication compliance: excellent compliance Aspirin: no Recent stressors: no Recurrent headaches: no Visual changes: no Palpitations: no Dyspnea: no Chest pain: no Lower extremity edema: no Dizzy/lightheaded: no  Relevant past medical, surgical, family and social history reviewed and updated as indicated. Interim medical history since our last visit reviewed. Allergies and medications reviewed and updated.  Review of Systems  Constitutional: Negative.   Respiratory: Negative.    Cardiovascular: Negative.   Gastrointestinal: Negative.   Musculoskeletal: Negative.   Neurological: Negative.   Psychiatric/Behavioral: Negative.      Per HPI unless specifically indicated above     Objective:    BP 107/71   Pulse 92   Wt 195 lb 3.2 oz (88.5 kg)   SpO2 95%   BMI  28.83 kg/m   Wt Readings from Last 3 Encounters:  02/12/23 195 lb 3.2 oz (88.5 kg)  11/12/22 190 lb (86.2 kg)  08/07/22 200 lb 6.4 oz (90.9 kg)    Physical Exam Vitals and nursing note reviewed.  Constitutional:      General: He is not in acute distress.    Appearance: Normal appearance. He is not ill-appearing, toxic-appearing or diaphoretic.  HENT:     Head: Normocephalic and atraumatic.     Right Ear: External ear normal.     Left Ear: External ear normal.     Nose: Nose normal.     Mouth/Throat:     Mouth: Mucous membranes are moist.     Pharynx: Oropharynx is clear.  Eyes:     General: No scleral icterus.       Right eye: No discharge.        Left eye: No discharge.     Extraocular Movements: Extraocular movements intact.     Conjunctiva/sclera: Conjunctivae normal.     Pupils: Pupils are equal, round, and reactive to light.  Cardiovascular:     Rate and Rhythm: Normal rate and regular rhythm.     Pulses: Normal pulses.     Heart sounds: Normal heart sounds. No murmur heard.    No friction rub. No gallop.  Pulmonary:     Effort: Pulmonary effort is normal. No respiratory distress.     Breath sounds: Normal breath sounds. No stridor. No wheezing, rhonchi or rales.  Chest:     Chest wall: No tenderness.  Musculoskeletal:        General: Normal range of motion.  Cervical back: Normal range of motion and neck supple.  Skin:    General: Skin is warm and dry.     Capillary Refill: Capillary refill takes less than 2 seconds.     Coloration: Skin is not jaundiced or pale.     Findings: No bruising, erythema, lesion or rash.  Neurological:     General: No focal deficit present.     Mental Status: He is alert and oriented to person, place, and time. Mental status is at baseline.  Psychiatric:        Mood and Affect: Mood normal.        Behavior: Behavior normal.        Thought Content: Thought content normal.        Judgment: Judgment normal.     Results for  orders placed or performed in visit on 11/12/22  Hgb A1c w/o eAG   Collection Time: 11/12/22  1:52 PM  Result Value Ref Range   Hgb A1c MFr Bld 7.3 (H) 4.8 - 5.6 %      Assessment & Plan:   Problem List Items Addressed This Visit       Cardiovascular and Mediastinum   Hypertension - Primary   Under good control on current regimen. Continue current regimen. Continue to monitor. Call with any concerns. Refills up to date. Labs drawn today.       Relevant Orders   CBC with Differential/Platelet   Comprehensive metabolic panel     Endocrine   Hyperlipidemia due to type 1 diabetes mellitus (HCC)   Under good control on current regimen. Continue current regimen. Continue to monitor. Call with any concerns. Refills up to date. Labs drawn today.       Relevant Medications   insulin  lispro (HUMALOG ) 100 UNIT/ML injection   Other Relevant Orders   CBC with Differential/Platelet   Comprehensive metabolic panel   Lipid Panel w/o Chol/HDL Ratio   Diabetic retinopathy associated with type 1 diabetes mellitus (HCC)   Doing well with A1c of 6.9 down from 7.3. Continue current regimen. Call with any concerns. Recheck 3 months.       Relevant Medications   insulin  lispro (HUMALOG ) 100 UNIT/ML injection   Other Relevant Orders   CBC with Differential/Platelet   Bayer DCA Hb A1c Waived   Comprehensive metabolic panel     Other   High risk medication use   Checking lamictal  level. Call with any concerns.       Relevant Orders   Lamotrigine  level     Follow up plan: Return in about 3 months (around 05/13/2023).

## 2023-02-12 NOTE — Assessment & Plan Note (Signed)
 Checking lamictal level. Call with any concerns.

## 2023-02-12 NOTE — Assessment & Plan Note (Signed)
 Under good control on current regimen. Continue current regimen. Continue to monitor. Call with any concerns. Refills up to date. Labs drawn today.

## 2023-02-15 LAB — LIPID PANEL W/O CHOL/HDL RATIO
Cholesterol, Total: 175 mg/dL (ref 100–199)
HDL: 71 mg/dL (ref >39)
LDL Chol Calc (NIH): 87 mg/dL (ref 0–99)
Triglycerides: 93 mg/dL (ref 0–149)
VLDL Cholesterol Cal: 17 mg/dL (ref 5–40)

## 2023-02-15 LAB — COMPREHENSIVE METABOLIC PANEL
ALT: 43 [IU]/L (ref 0–44)
AST: 32 [IU]/L (ref 0–40)
Albumin: 4.6 g/dL (ref 4.1–5.1)
Alkaline Phosphatase: 84 [IU]/L (ref 44–121)
BUN/Creatinine Ratio: 19 (ref 9–20)
BUN: 20 mg/dL (ref 6–24)
Bilirubin Total: 0.4 mg/dL (ref 0.0–1.2)
CO2: 19 mmol/L — ABNORMAL LOW (ref 20–29)
Calcium: 9.6 mg/dL (ref 8.7–10.2)
Chloride: 105 mmol/L (ref 96–106)
Creatinine, Ser: 1.03 mg/dL (ref 0.76–1.27)
Globulin, Total: 1.7 g/dL (ref 1.5–4.5)
Glucose: 123 mg/dL — ABNORMAL HIGH (ref 70–99)
Potassium: 4.5 mmol/L (ref 3.5–5.2)
Sodium: 140 mmol/L (ref 134–144)
Total Protein: 6.3 g/dL (ref 6.0–8.5)
eGFR: 91 mL/min/{1.73_m2} (ref >59)

## 2023-02-15 LAB — CBC WITH DIFFERENTIAL/PLATELET
Basophils Absolute: 0.1 10*3/uL (ref 0.0–0.2)
Basos: 1 %
EOS (ABSOLUTE): 0.3 10*3/uL (ref 0.0–0.4)
Eos: 3 %
Hematocrit: 45.6 % (ref 37.5–51.0)
Hemoglobin: 15.6 g/dL (ref 13.0–17.7)
Immature Grans (Abs): 0.1 10*3/uL (ref 0.0–0.1)
Immature Granulocytes: 1 %
Lymphocytes Absolute: 1.3 10*3/uL (ref 0.7–3.1)
Lymphs: 16 %
MCH: 31 pg (ref 26.6–33.0)
MCHC: 34.2 g/dL (ref 31.5–35.7)
MCV: 91 fL (ref 79–97)
Monocytes Absolute: 0.5 10*3/uL (ref 0.1–0.9)
Monocytes: 6 %
Neutrophils Absolute: 5.9 10*3/uL (ref 1.4–7.0)
Neutrophils: 73 %
Platelets: 224 10*3/uL (ref 150–450)
RBC: 5.03 x10E6/uL (ref 4.14–5.80)
RDW: 12.7 % (ref 11.6–15.4)
WBC: 8.1 10*3/uL (ref 3.4–10.8)

## 2023-02-15 LAB — LAMOTRIGINE LEVEL: Lamotrigine Lvl: 4.5 ug/mL (ref 2.0–20.0)

## 2023-02-20 ENCOUNTER — Encounter: Payer: Self-pay | Admitting: Family Medicine

## 2023-03-05 DIAGNOSIS — F331 Major depressive disorder, recurrent, moderate: Secondary | ICD-10-CM | POA: Diagnosis not present

## 2023-03-05 DIAGNOSIS — F411 Generalized anxiety disorder: Secondary | ICD-10-CM | POA: Diagnosis not present

## 2023-03-07 DIAGNOSIS — F411 Generalized anxiety disorder: Secondary | ICD-10-CM | POA: Diagnosis not present

## 2023-03-07 DIAGNOSIS — F3161 Bipolar disorder, current episode mixed, mild: Secondary | ICD-10-CM | POA: Diagnosis not present

## 2023-04-05 DIAGNOSIS — E109 Type 1 diabetes mellitus without complications: Secondary | ICD-10-CM | POA: Diagnosis not present

## 2023-04-05 DIAGNOSIS — E785 Hyperlipidemia, unspecified: Secondary | ICD-10-CM | POA: Diagnosis not present

## 2023-04-05 DIAGNOSIS — F3161 Bipolar disorder, current episode mixed, mild: Secondary | ICD-10-CM | POA: Diagnosis not present

## 2023-04-05 DIAGNOSIS — F411 Generalized anxiety disorder: Secondary | ICD-10-CM | POA: Diagnosis not present

## 2023-04-05 DIAGNOSIS — E1069 Type 1 diabetes mellitus with other specified complication: Secondary | ICD-10-CM | POA: Diagnosis not present

## 2023-04-05 DIAGNOSIS — I152 Hypertension secondary to endocrine disorders: Secondary | ICD-10-CM | POA: Diagnosis not present

## 2023-04-19 DIAGNOSIS — F3161 Bipolar disorder, current episode mixed, mild: Secondary | ICD-10-CM | POA: Diagnosis not present

## 2023-04-19 DIAGNOSIS — F411 Generalized anxiety disorder: Secondary | ICD-10-CM | POA: Diagnosis not present

## 2023-04-23 ENCOUNTER — Other Ambulatory Visit: Payer: Self-pay | Admitting: Family Medicine

## 2023-04-23 DIAGNOSIS — I10 Essential (primary) hypertension: Secondary | ICD-10-CM

## 2023-04-24 NOTE — Telephone Encounter (Signed)
 Requested Prescriptions  Pending Prescriptions Disp Refills   lisinopril (ZESTRIL) 2.5 MG tablet [Pharmacy Med Name: LISINOPRIL TABS 2.5MG ] 90 tablet 1    Sig: TAKE 1 TABLET DAILY     Cardiovascular:  ACE Inhibitors Passed - 04/24/2023 10:53 AM      Passed - Cr in normal range and within 180 days    Creatinine  Date Value Ref Range Status  05/24/2014 0.79 mg/dL Final    Comment:    1.61-0.96 NOTE: New Reference Range  04/13/14    Creatinine, Ser  Date Value Ref Range Status  02/12/2023 1.03 0.76 - 1.27 mg/dL Final         Passed - K in normal range and within 180 days    Potassium  Date Value Ref Range Status  02/12/2023 4.5 3.5 - 5.2 mmol/L Final  05/24/2014 4.3 mmol/L Final    Comment:    3.5-5.1 NOTE: New Reference Range  04/13/14          Passed - Patient is not pregnant      Passed - Last BP in normal range    BP Readings from Last 1 Encounters:  02/12/23 107/71         Passed - Valid encounter within last 6 months    Recent Outpatient Visits           2 months ago Primary hypertension   [place] [facility], Megan P, DO   5 months ago 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, unspecified retinopathy severity (HCC)   [place] [facility] [place], Megan P, DO   8 months ago Routine general medical examination at a health care facility   [facility], [facility] P, DO   11 months ago 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

## 2023-05-03 DIAGNOSIS — F3161 Bipolar disorder, current episode mixed, mild: Secondary | ICD-10-CM | POA: Diagnosis not present

## 2023-05-03 DIAGNOSIS — F411 Generalized anxiety disorder: Secondary | ICD-10-CM | POA: Diagnosis not present

## 2023-05-15 DIAGNOSIS — F411 Generalized anxiety disorder: Secondary | ICD-10-CM | POA: Diagnosis not present

## 2023-05-15 DIAGNOSIS — F39 Unspecified mood [affective] disorder: Secondary | ICD-10-CM | POA: Diagnosis not present

## 2023-05-17 DIAGNOSIS — F3111 Bipolar disorder, current episode manic without psychotic features, mild: Secondary | ICD-10-CM | POA: Diagnosis not present

## 2023-05-17 DIAGNOSIS — F411 Generalized anxiety disorder: Secondary | ICD-10-CM | POA: Diagnosis not present

## 2023-05-23 ENCOUNTER — Ambulatory Visit: Payer: Self-pay | Admitting: Family Medicine

## 2023-05-29 DIAGNOSIS — F411 Generalized anxiety disorder: Secondary | ICD-10-CM | POA: Diagnosis not present

## 2023-05-29 DIAGNOSIS — F3111 Bipolar disorder, current episode manic without psychotic features, mild: Secondary | ICD-10-CM | POA: Diagnosis not present

## 2023-05-30 ENCOUNTER — Other Ambulatory Visit: Payer: Self-pay | Admitting: Family Medicine

## 2023-05-31 NOTE — Telephone Encounter (Signed)
 LOV 02/12/2023  Requested Prescriptions  Pending Prescriptions Disp Refills   pantoprazole  (PROTONIX ) 40 MG tablet [Pharmacy Med Name: PANTOPRAZOLE  SODIUM DR TABS 40MG ] 180 tablet 0    Sig: TAKE 1 TABLET TWICE A DAY     Gastroenterology: Proton Pump Inhibitors Failed - 05/31/2023  8:01 AM      Failed - Valid encounter within last 12 months    Recent Outpatient Visits   None

## 2023-05-31 NOTE — Telephone Encounter (Signed)
 LOV 02/12/2023 Requested Prescriptions  Pending Prescriptions Disp Refills   pantoprazole  (PROTONIX ) 40 MG tablet [Pharmacy Med Name: PANTOPRAZOLE  SODIUM DR TABS 40MG ] 180 tablet 0    Sig: TAKE 1 TABLET TWICE A DAY     Gastroenterology: Proton Pump Inhibitors Failed - 05/31/2023  8:02 AM      Failed - Valid encounter within last 12 months    Recent Outpatient Visits   None

## 2023-06-06 ENCOUNTER — Encounter: Payer: Self-pay | Admitting: Family Medicine

## 2023-06-06 ENCOUNTER — Ambulatory Visit: Admitting: Family Medicine

## 2023-06-06 VITALS — BP 135/83 | HR 98 | Ht 69.0 in | Wt 191.8 lb

## 2023-06-06 DIAGNOSIS — E7800 Pure hypercholesterolemia, unspecified: Secondary | ICD-10-CM

## 2023-06-06 DIAGNOSIS — E10319 Type 1 diabetes mellitus with unspecified diabetic retinopathy without macular edema: Secondary | ICD-10-CM | POA: Diagnosis not present

## 2023-06-06 MED ORDER — FAMOTIDINE 20 MG PO TABS: ORAL_TABLET | ORAL | 1 refills | Status: DC

## 2023-06-06 MED ORDER — ROPINIROLE HCL 1 MG PO TABS: 1.0000 mg | ORAL_TABLET | Freq: Every day | ORAL | 1 refills | Status: DC

## 2023-06-06 MED ORDER — ROPINIROLE HCL 0.5 MG PO TABS: 0.5000 mg | ORAL_TABLET | Freq: Every day | ORAL | 1 refills | Status: DC

## 2023-06-06 MED ORDER — ATORVASTATIN CALCIUM 40 MG PO TABS: ORAL_TABLET | ORAL | 1 refills | Status: DC

## 2023-06-06 NOTE — Progress Notes (Signed)
 BP 135/83 (BP Location: Left Arm, Patient Position: Sitting)   Pulse 98   Ht 5\' 9"  (1.753 m)   Wt 191 lb 12.8 oz (87 kg)   SpO2 96%   BMI 28.32 kg/m    Subjective:    Patient ID: Bernard [name], male    DOB: 26-Oct-1975, 48 y.o.   MRN: [id number]  HPI: Bernard [name] is a 48 y.o. male  Chief Complaint  Patient presents with   Diabetes   DIABETES Hypoglycemic episodes:no Polydipsia/polyuria: no Visual disturbance: no Chest pain: no Paresthesias: no Glucose Monitoring: yes  Accucheck frequency: continuous  Fasting glucose: 180s-190s  Evening: running high 180-210, after he's in bed, asleep Taking Insulin ?: yes Blood Pressure Monitoring: not checking Retinal Examination: Up to Date Foot Exam: Up to Date Diabetic Education: Completed Pneumovax: Up to Date Influenza: Up to Date Aspirin: no  Relevant past medical, surgical, family and social history reviewed and updated as indicated. Interim medical history since our last visit reviewed. Allergies and medications reviewed and updated.  Review of Systems  Constitutional: Negative.   Respiratory: Negative.    Cardiovascular: Negative.   Musculoskeletal: Negative.   Psychiatric/Behavioral: Negative.      Per HPI unless specifically indicated above     Objective:    BP 135/83 (BP Location: Left Arm, Patient Position: Sitting)   Pulse 98   Ht 5\' 9"  (1.753 m)   Wt 191 lb 12.8 oz (87 kg)   SpO2 96%   BMI 28.32 kg/m   Wt Readings from Last 3 Encounters:  06/06/23 191 lb 12.8 oz (87 kg)  02/12/23 195 lb 3.2 oz (88.5 kg)  11/12/22 190 lb (86.2 kg)    Physical Exam Vitals and nursing note reviewed.  Constitutional:      General: He is not in acute distress.    Appearance: Normal appearance. He is not ill-appearing, toxic-appearing or diaphoretic.  HENT:     Head: Normocephalic and atraumatic.     Right Ear: External ear normal.     Left Ear: External ear normal.     Nose: Nose normal.      Mouth/Throat:     Mouth: Mucous membranes are moist.     Pharynx: Oropharynx is clear.  Eyes:     General: No scleral icterus.       Right eye: No discharge.        Left eye: No discharge.     Extraocular Movements: Extraocular movements intact.     Conjunctiva/sclera: Conjunctivae normal.     Pupils: Pupils are equal, round, and reactive to light.  Cardiovascular:     Rate and Rhythm: Normal rate and regular rhythm.     Pulses: Normal pulses.     Heart sounds: Normal heart sounds. No murmur heard.    No friction rub. No gallop.  Pulmonary:     Effort: Pulmonary effort is normal. No respiratory distress.     Breath sounds: Normal breath sounds. No stridor. No wheezing, rhonchi or rales.  Chest:     Chest wall: No tenderness.  Musculoskeletal:        General: Normal range of motion.     Cervical back: Normal range of motion and neck supple.  Skin:    General: Skin is warm and dry.     Capillary Refill: Capillary refill takes less than 2 seconds.     Coloration: Skin is not jaundiced or pale.     Findings: No bruising, erythema, lesion or rash.  Neurological:  General: No focal deficit present.     Mental Status: He is alert and oriented to person, place, and time. Mental status is at baseline.  Psychiatric:        Mood and Affect: Mood normal.        Behavior: Behavior normal.        Thought Content: Thought content normal.        Judgment: Judgment normal.     Results for orders placed or performed in visit on 02/20/23  HM DIABETES EYE EXAM   Collection Time: 12/10/22 12:03 PM  Result Value Ref Range   HM Diabetic Eye Exam Retinopathy (A) No Retinopathy      Assessment & Plan:   Problem List Items Addressed This Visit       Endocrine   Diabetic retinopathy associated with type 1 diabetes mellitus (HCC) - Primary   Rechecking labs today. Await results. Treat as needed. Given spiking in sleep, consider sleep study if it continues.       Relevant Medications    atorvastatin  (LIPITOR) 40 MG tablet   Other Relevant Orders   Bayer DCA Hb A1c Waived   Hgb A1c w/o eAG   Pneumococcal conjugate vaccine 20-valent (Prevnar 20) (Completed)   Other Visit Diagnoses       Pure hypercholesterolemia       Relevant Medications   atorvastatin  (LIPITOR) 40 MG tablet        Follow up plan: Return in about 3 months (around 09/06/2023).

## 2023-06-06 NOTE — Assessment & Plan Note (Signed)
 Rechecking labs today. Await results. Treat as needed. Given spiking in sleep, consider sleep study if it continues.

## 2023-06-10 ENCOUNTER — Encounter: Payer: Self-pay | Admitting: Family Medicine

## 2023-06-13 DIAGNOSIS — F411 Generalized anxiety disorder: Secondary | ICD-10-CM | POA: Diagnosis not present

## 2023-06-13 DIAGNOSIS — F3111 Bipolar disorder, current episode manic without psychotic features, mild: Secondary | ICD-10-CM | POA: Diagnosis not present

## 2023-06-19 ENCOUNTER — Encounter: Payer: Self-pay | Admitting: Family Medicine

## 2023-06-21 DIAGNOSIS — F411 Generalized anxiety disorder: Secondary | ICD-10-CM | POA: Diagnosis not present

## 2023-06-21 DIAGNOSIS — F3111 Bipolar disorder, current episode manic without psychotic features, mild: Secondary | ICD-10-CM | POA: Diagnosis not present

## 2023-08-02 DIAGNOSIS — E109 Type 1 diabetes mellitus without complications: Secondary | ICD-10-CM | POA: Diagnosis not present

## 2023-08-02 DIAGNOSIS — E785 Hyperlipidemia, unspecified: Secondary | ICD-10-CM | POA: Diagnosis not present

## 2023-08-02 DIAGNOSIS — E1069 Type 1 diabetes mellitus with other specified complication: Secondary | ICD-10-CM | POA: Diagnosis not present

## 2023-08-02 DIAGNOSIS — I152 Hypertension secondary to endocrine disorders: Secondary | ICD-10-CM | POA: Diagnosis not present

## 2023-08-12 DIAGNOSIS — F39 Unspecified mood [affective] disorder: Secondary | ICD-10-CM | POA: Diagnosis not present

## 2023-08-12 DIAGNOSIS — F411 Generalized anxiety disorder: Secondary | ICD-10-CM | POA: Diagnosis not present

## 2023-08-12 DIAGNOSIS — F5105 Insomnia due to other mental disorder: Secondary | ICD-10-CM | POA: Diagnosis not present

## 2023-08-28 ENCOUNTER — Other Ambulatory Visit: Payer: Self-pay | Admitting: Family Medicine

## 2023-08-29 NOTE — Telephone Encounter (Signed)
 Requested Prescriptions  Pending Prescriptions Disp Refills   pantoprazole  (PROTONIX ) 40 MG tablet [Pharmacy Med Name: PANTOPRAZOLE  SODIUM DR TABS 40MG ] 180 tablet 2    Sig: TAKE 1 TABLET TWICE A DAY     Gastroenterology: Proton Pump Inhibitors Passed - 08/29/2023  3:46 PM      Passed - Valid encounter within last 12 months    Recent Outpatient Visits           2 months ago 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, unspecified retinopathy severity (HCC)   [facility] [place], [place], DO

## 2023-09-16 ENCOUNTER — Ambulatory Visit: Admitting: Family Medicine

## 2023-10-08 LAB — HEMOGLOBIN A1C: Hemoglobin A1C: 7.1

## 2023-10-21 ENCOUNTER — Other Ambulatory Visit: Payer: Self-pay | Admitting: Family Medicine

## 2023-10-21 DIAGNOSIS — I10 Essential (primary) hypertension: Secondary | ICD-10-CM

## 2023-10-22 NOTE — Telephone Encounter (Signed)
 Requested Prescriptions  Pending Prescriptions Disp Refills   lisinopril  (ZESTRIL ) 2.5 MG tablet [Pharmacy Med Name: LISINOPRIL  TABS 2.5MG ] 90 tablet 1    Sig: TAKE 1 TABLET DAILY     Cardiovascular:  ACE Inhibitors Failed - 10/22/2023 12:28 PM      Failed - Cr in normal range and within 180 days    Creatinine  Date Value Ref Range Status  05/24/2014 0.79 mg/dL Final    Comment:    9.38-8.75 NOTE: New Reference Range  04/13/14    Creatinine, Ser  Date Value Ref Range Status  02/12/2023 1.03 0.76 - 1.27 mg/dL Final         Failed - K in normal range and within 180 days    Potassium  Date Value Ref Range Status  02/12/2023 4.5 3.5 - 5.2 mmol/L Final  05/24/2014 4.3 mmol/L Final    Comment:    3.5-5.1 NOTE: New Reference Range  04/13/14          Passed - Patient is not pregnant      Passed - Last BP in normal range    BP Readings from Last 1 Encounters:  06/06/23 135/83         Passed - Valid encounter within last 6 months    Recent Outpatient Visits           4 months ago 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, unspecified retinopathy severity (HCC)   [facility] [place], [place], DO

## 2023-10-24 ENCOUNTER — Ambulatory Visit: Admitting: Family Medicine

## 2023-10-24 ENCOUNTER — Encounter: Payer: Self-pay | Admitting: Family Medicine

## 2023-10-24 VITALS — BP 119/83 | HR 93 | Temp 98.4°F | Ht 69.0 in | Wt 199.0 lb

## 2023-10-24 DIAGNOSIS — G2581 Restless legs syndrome: Secondary | ICD-10-CM

## 2023-10-24 DIAGNOSIS — F3172 Bipolar disorder, in full remission, most recent episode hypomanic: Secondary | ICD-10-CM

## 2023-10-24 DIAGNOSIS — E10319 Type 1 diabetes mellitus with unspecified diabetic retinopathy without macular edema: Secondary | ICD-10-CM | POA: Diagnosis not present

## 2023-10-24 DIAGNOSIS — I10 Essential (primary) hypertension: Secondary | ICD-10-CM

## 2023-10-24 DIAGNOSIS — F339 Major depressive disorder, recurrent, unspecified: Secondary | ICD-10-CM

## 2023-10-24 DIAGNOSIS — Z0000 Encounter for general adult medical examination without abnormal findings: Secondary | ICD-10-CM

## 2023-10-24 DIAGNOSIS — E1069 Type 1 diabetes mellitus with other specified complication: Secondary | ICD-10-CM

## 2023-10-24 LAB — MICROALBUMIN, URINE WAIVED
Creatinine, Urine Waived: 50 mg/dL (ref 10–300)
Microalb, Ur Waived: 10 mg/L (ref 0–19)
Microalb/Creat Ratio: <30 mg/g (ref <30)

## 2023-10-24 MED ORDER — FAMOTIDINE 20 MG PO TABS: ORAL_TABLET | ORAL | 1 refills | Status: AC

## 2023-10-24 MED ORDER — ROPINIROLE HCL 1 MG PO TABS: 1.0000 mg | ORAL_TABLET | Freq: Every day | ORAL | 1 refills | Status: AC

## 2023-10-24 MED ORDER — ATORVASTATIN CALCIUM 40 MG PO TABS
ORAL_TABLET | ORAL | 1 refills | Status: AC
Start: 2023-10-24 — End: ?

## 2023-10-24 MED ORDER — ONDANSETRON 4 MG PO TBDP: 4.0000 mg | ORAL_TABLET | Freq: Three times a day (TID) | ORAL | 1 refills | Status: AC | PRN

## 2023-10-24 NOTE — Assessment & Plan Note (Signed)
 Under good control on current regimen. Continue current regimen. Continue to monitor. Call with any concerns. Refills given. Labs drawn today.

## 2023-10-24 NOTE — Progress Notes (Signed)
 BP 119/83 (BP Location: Left Arm, Patient Position: Sitting, Cuff Size: Large)   Pulse 93   Temp 98.4 F (36.9 C)   Ht 5' 9 (1.753 m)   Wt 199 lb (90.3 kg)   SpO2 99%   BMI 29.39 kg/m    Subjective:    Patient ID: Bernard [name], male    DOB: 1975-06-30, 48 y.o.   MRN: [id number]  HPI: Bernard [name] is a 48 y.o. male presenting on 10/24/2023 for comprehensive medical examination. Current medical complaints include:  DIABETES Hypoglycemic episodes:no Polydipsia/polyuria: no Visual disturbance: no Chest pain: no Paresthesias: no Glucose Monitoring: yes  Accucheck frequency: continuous Taking Insulin ?: yes Blood Pressure Monitoring: a few times a month Retinal Examination: Up to Date Foot Exam: Up to Date Diabetic Education: Completed Pneumovax: Up to Date Influenza: Not up to Date Aspirin: yes  HYPERTENSION / HYPERLIPIDEMIA Satisfied with current treatment? yes Duration of hypertension: chronic BP monitoring frequency: a few times a month BP medication side effects: no Past BP meds: lisinopril  Duration of hyperlipidemia: chronic Cholesterol medication side effects: no Cholesterol supplements: none Past cholesterol medications: atorvastatin  Medication compliance: excellent compliance Aspirin: yes Recent stressors: no Recurrent headaches: no Visual changes: no Palpitations: no Dyspnea: no Chest pain: no Lower extremity edema: no Dizzy/lightheaded: no  DEPRESSION Mood status: controlled Satisfied with current treatment?: yes Symptom severity: mild  Duration of current treatment : chronic Side effects: no Medication compliance: excellent compliance Psychotherapy/counseling: yes in the past Previous psychiatric medications: lamictal  Depressed mood: no Anxious mood: no Anhedonia: no Significant weight loss or gain: no Insomnia: no  Fatigue: yes Feelings of worthlessness or guilt: no Impaired concentration/indecisiveness: no Suicidal  ideations: no Hopelessness: no Crying spells: no    10/24/2023    1:11 PM 06/06/2023    3:44 PM 02/12/2023    1:59 PM 11/12/2022    1:51 PM 08/07/2022    2:16 PM  Depression screen PHQ 2/9  Decreased Interest 0 0 0 0 2  Down, Depressed, Hopeless 0 0 0 0 2  PHQ - 2 Score 0 0 0 0 4  Altered sleeping 0 0 0 3 0  Tired, decreased energy 0 0 0 1 2  Change in appetite 0 2 3 0 0  Feeling bad or failure about yourself  0 0 0 0 2  Trouble concentrating 0 0 0 0 0  Moving slowly or fidgety/restless 0 0 0 0 0  Suicidal thoughts 0 0 0 0 0  PHQ-9 Score 0 2 3 4 8   Difficult doing work/chores Not difficult at all  Not difficult at all Not difficult at all Not difficult at all     He currently lives with: wife and kids Interim Problems from his last visit: no  Depression Screen done today and results listed below:     10/24/2023    1:11 PM 06/06/2023    3:44 PM 02/12/2023    1:59 PM 11/12/2022    1:51 PM 08/07/2022    2:16 PM  Depression screen PHQ 2/9  Decreased Interest 0 0 0 0 2  Down, Depressed, Hopeless 0 0 0 0 2  PHQ - 2 Score 0 0 0 0 4  Altered sleeping 0 0 0 3 0  Tired, decreased energy 0 0 0 1 2  Change in appetite 0 2 3 0 0  Feeling bad or failure about yourself  0 0 0 0 2  Trouble concentrating 0 0 0 0 0  Moving slowly or fidgety/restless 0  0 0 0 0  Suicidal thoughts 0 0 0 0 0  PHQ-9 Score 0 2 3 4 8   Difficult doing work/chores Not difficult at all  Not difficult at all Not difficult at all Not difficult at all    Past Medical History:  Past Medical History:  Diagnosis Date   Anxiety    Depression    Esophageal stricture    Hyperlipidemia    Hypertension    Weight loss     Surgical History:  Past Surgical History:  Procedure Laterality Date   PILONIDAL CYST EXCISION     WISDOM TOOTH EXTRACTION      Medications:  Current Outpatient Medications on File Prior to Visit  Medication Sig   Blood Glucose Monitoring Suppl (ONE TOUCH ULTRA 2) w/Device KIT 1 each by Does not  apply route daily at 6 (six) AM.   Cetirizine HCl 10 MG CAPS Take 10 mg by mouth daily.   Continuous Blood Gluc Sensor (DEXCOM G6 SENSOR) MISC SMARTSIG:1 Each Topical Every 10 Days   Continuous Blood Gluc Transmit (DEXCOM G6 TRANSMITTER) MISC USE 1 EACH EVERY 3 (THREE) MONTHS   glucose blood (ONETOUCH ULTRA) test strip 1 each by Other route daily. Use as instructed   insulin  aspart (NOVOLOG ) 100 UNIT/ML injection INJECT UP TO 100 UNITS VIA PUMP EVERY DAY   Insulin  Disposable Pump (OMNIPOD 5 G6 INTRO, GEN 5,) KIT Inject into the skin.   insulin  glargine (LANTUS ) 100 UNIT/ML Solostar Pen    insulin  lispro (HUMALOG ) 100 UNIT/ML injection    Insulin  Pen Needle 29G X 12.7MM MISC 1 each by Does not apply route 3 (three) times daily.   lamoTRIgine  (LAMICTAL ) 100 MG tablet Take 100 mg by mouth daily.   lamoTRIgine  (LAMICTAL ) 150 MG tablet Take 150 mg by mouth daily.   Lancets (ONETOUCH DELICA PLUS LANCET30G) MISC    Lancets (ONETOUCH ULTRASOFT) lancets 1 each by Other route daily. Use as instructed   lisinopril  (ZESTRIL ) 2.5 MG tablet TAKE 1 TABLET DAILY   pantoprazole  (PROTONIX ) 40 MG tablet TAKE 1 TABLET TWICE A DAY   simethicone  (GAS-X) 80 MG chewable tablet Chew 1 tablet (80 mg total) by mouth every 6 (six) hours as needed for flatulence (or cramping).   traZODone (DESYREL) 50 MG tablet Take 50 mg by mouth at bedtime as needed.   No current facility-administered medications on file prior to visit.    Allergies:  Allergies  Allergen Reactions   Chantix  [Varenicline ] Other (See Comments)    Insomnia , shakes    Social History:  Social History   Socioeconomic History   Marital status: Married    Spouse name: joan    Number of children: 2   Years of education: Not on file   Highest education level: Bachelor's degree (e.g., BA, AB, BS)  Occupational History   Not on file  Tobacco Use   Smoking status: Former    Current packs/day: 0.50    Average packs/day: 0.5 packs/day for 6.8  years (3.4 ttl pk-yrs)    Types: Cigarettes    Start date: 01/22/2017   Smokeless tobacco: Never   Tobacco comments:    1/2 of cigarettes 6 per day- 01/19/2021  Vaping Use   Vaping status: Never Used  Substance and Sexual Activity   Alcohol use: Yes    Alcohol/week: 7.0 standard drinks of alcohol    Types: 7 Cans of beer per week    Comment: 1-2 every day   Drug use: No   Sexual  activity: Yes    Birth control/protection: None  Other Topics Concern   Not on file  Social History Narrative   Not on file   Social Drivers of Health   Financial Resource Strain: Low Risk  (02/08/2023)   Overall Financial Resource Strain (CARDIA)    Difficulty of Paying Living Expenses: Not hard at all  Food Insecurity: No Food Insecurity (02/08/2023)   Hunger Vital Sign    Worried About Running Out of Food in the Last Year: Never true    Ran Out of Food in the Last Year: Never true  Transportation Needs: No Transportation Needs (02/08/2023)   PRAPARE - Administrator, Civil Service (Medical): No    Lack of Transportation (Non-Medical): No  Physical Activity: Unknown (02/08/2023)   Exercise Vital Sign    Days of Exercise per Week: 0 days    Minutes of Exercise per Session: Not on file  Recent Concern: Physical Activity - Inactive (02/08/2023)   Exercise Vital Sign    Days of Exercise per Week: 0 days    Minutes of Exercise per Session: 20 min  Stress: No Stress Concern Present (02/08/2023)   [employer] of Occupational Health - Occupational Stress Questionnaire    Feeling of Stress : Not at all  Social Connections: Moderately Isolated (02/08/2023)   Social Connection and Isolation Panel    Frequency of Communication with Friends and Family: More than three times a week    Frequency of Social Gatherings with Friends and Family: Once a week    Attends Religious Services: Never    Database administrator or Organizations: No    Attends Engineer, structural: Not on file    Marital  Status: Married  Catering manager Violence: Not At Risk (01/08/2017)   Humiliation, Afraid, Rape, and Kick questionnaire    Fear of Current or Ex-Partner: No    Emotionally Abused: No    Physically Abused: No    Sexually Abused: No   Social History   Tobacco Use  Smoking Status Former   Current packs/day: 0.50   Average packs/day: 0.5 packs/day for 6.8 years (3.4 ttl pk-yrs)   Types: Cigarettes   Start date: 01/22/2017  Smokeless Tobacco Never  Tobacco Comments   1/2 of cigarettes 6 per day- 01/19/2021   Social History   Substance and Sexual Activity  Alcohol Use Yes   Alcohol/week: 7.0 standard drinks of alcohol   Types: 7 Cans of beer per week   Comment: 1-2 every day    Family History:  Family History  Problem Relation Age of Onset   Cancer Mother    Anxiety disorder Mother    Depression Mother    Cancer Father        skin   Cancer Sister        some sort of intestinal cancer   Cancer - Other Sister    Anxiety disorder Brother    Depression Brother    Pancreatic cancer Paternal Uncle     Past medical history, surgical history, medications, allergies, family history and social history reviewed with patient today and changes made to appropriate areas of the chart.   Review of Systems  Constitutional: Negative.   HENT: Negative.    Eyes: Negative.   Respiratory: Negative.    Cardiovascular: Negative.   Gastrointestinal: Negative.   Genitourinary: Negative.   Musculoskeletal: Negative.   Skin: Negative.   Neurological: Negative.   Endo/Heme/Allergies: Negative.   Psychiatric/Behavioral: Negative.  All other ROS negative except what is listed above and in the HPI.      Objective:    BP 119/83 (BP Location: Left Arm, Patient Position: Sitting, Cuff Size: Large)   Pulse 93   Temp 98.4 F (36.9 C)   Ht 5' 9 (1.753 m)   Wt 199 lb (90.3 kg)   SpO2 99%   BMI 29.39 kg/m   Wt Readings from Last 3 Encounters:  10/24/23 199 lb (90.3 kg)  06/06/23  191 lb 12.8 oz (87 kg)  02/12/23 195 lb 3.2 oz (88.5 kg)    Physical Exam Vitals and nursing note reviewed.  Constitutional:      General: He is not in acute distress.    Appearance: Normal appearance. He is normal weight. He is not ill-appearing, toxic-appearing or diaphoretic.  HENT:     Head: Normocephalic and atraumatic.     Right Ear: Tympanic membrane, ear canal and external ear normal. There is no impacted cerumen.     Left Ear: Tympanic membrane, ear canal and external ear normal. There is no impacted cerumen.     Nose: Nose normal. No congestion or rhinorrhea.     Mouth/Throat:     Mouth: Mucous membranes are moist.     Pharynx: Oropharynx is clear. No oropharyngeal exudate or posterior oropharyngeal erythema.  Eyes:     General: No scleral icterus.       Right eye: No discharge.        Left eye: No discharge.     Extraocular Movements: Extraocular movements intact.     Conjunctiva/sclera: Conjunctivae normal.     Pupils: Pupils are equal, round, and reactive to light.  Neck:     Vascular: No carotid bruit.  Cardiovascular:     Rate and Rhythm: Normal rate and regular rhythm.     Pulses: Normal pulses.     Heart sounds: No murmur heard.    No friction rub. No gallop.  Pulmonary:     Effort: Pulmonary effort is normal. No respiratory distress.     Breath sounds: Normal breath sounds. No stridor. No wheezing, rhonchi or rales.  Chest:     Chest wall: No tenderness.  Abdominal:     General: Abdomen is flat. Bowel sounds are normal. There is no distension.     Palpations: Abdomen is soft. There is no mass.     Tenderness: There is no abdominal tenderness. There is no right CVA tenderness, left CVA tenderness, guarding or rebound.     Hernia: No hernia is present.  Genitourinary:    Comments: Genital exam deferred with shared decision making Musculoskeletal:        General: No swelling, tenderness, deformity or signs of injury.     Cervical back: Normal range of motion  and neck supple. No rigidity. No muscular tenderness.     Right lower leg: No edema.     Left lower leg: No edema.  Lymphadenopathy:     Cervical: No cervical adenopathy.  Skin:    General: Skin is warm and dry.     Capillary Refill: Capillary refill takes less than 2 seconds.     Coloration: Skin is not jaundiced or pale.     Findings: No bruising, erythema, lesion or rash.  Neurological:     General: No focal deficit present.     Mental Status: He is alert and oriented to person, place, and time.     Cranial Nerves: No cranial nerve deficit.  Sensory: No sensory deficit.     Motor: No weakness.     Coordination: Coordination normal.     Gait: Gait normal.     Deep Tendon Reflexes: Reflexes normal.  Psychiatric:        Mood and Affect: Mood normal.        Behavior: Behavior normal.        Thought Content: Thought content normal.        Judgment: Judgment normal.     Results for orders placed or performed in visit on 10/24/23  Hemoglobin A1c   Collection Time: 10/08/23 12:00 AM  Result Value Ref Range   Hemoglobin A1C 7.1       Assessment & Plan:   Problem List Items Addressed This Visit       Cardiovascular and Mediastinum   Hypertension   Under good control on current regimen. Continue current regimen. Continue to monitor. Call with any concerns. Refills given. Labs drawn today.        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 Continue current regimen. Continue to monitor. Call with any concerns. Refills given. Labs drawn today.       Relevant Medications   atorvastatin  (LIPITOR) 40 MG tablet   Diabetic retinopathy associated with type 1 diabetes mellitus (HCC)   Recently checked with A1c of 7.1. Continue current regimen. Continue to follow with endocrinology. Call with any concerns.       Relevant Medications   atorvastatin   (LIPITOR) 40 MG tablet   Other Relevant Orders   Microalbumin, Urine Waived   Pfizer Comirnaty Covid -19 Vaccine 110yrs and older (Completed)     Other   Depression, recurrent (HCC)   Stable. Continue to follow with psychiatry. Call with any concerns.       Bipolar disorder, in full remission, most recent episode hypomanic (HCC)   Stable. Continue to follow with psychiatry. Call with any concerns.       RLS (restless legs syndrome)   Under good control on current regimen. Continue current regimen. Continue to monitor. Call with any concerns. Refills given. Labs drawn today.       Other Visit Diagnoses       Routine general medical examination at a health care facility    -  Primary   Vaccines up to date. Screening labs checked today. Colonoscopy up to date. Continue diet and exercise. Call with any concerns.   Relevant Orders   Comprehensive metabolic panel with GFR   CBC with Differential/Platelet   Lipid Panel w/o Chol/HDL Ratio   PSA   TSH   Hepatitis B surface antibody,quantitative       LABORATORY TESTING:  Health maintenance labs ordered today as discussed above.   The natural history of prostate cancer and ongoing controversy regarding screening and potential treatment outcomes of prostate cancer has been discussed with the patient. The meaning of a false positive PSA and a false negative PSA has been discussed. He indicates understanding of the limitations of this screening test and wishes to proceed with screening PSA testing.   IMMUNIZATIONS:   - Tdap: Tetanus vaccination status reviewed: last tetanus booster within 10 years. - Influenza: will come back - Pneumovax: Up to date - Prevnar: Up to date - COVID: Administered today - HPV: Not applicable - Shingrix vaccine: Not applicable  SCREENING: - Colonoscopy: Up to  date  Discussed with patient purpose of the colonoscopy is to detect colon cancer at curable precancerous or early stages   PATIENT COUNSELING:     Sexuality: Discussed sexually transmitted diseases, partner selection, use of condoms, avoidance of unintended pregnancy  and contraceptive alternatives.   Advised to avoid cigarette smoking.  I discussed with the patient that most people either abstain from alcohol or drink within safe limits (<=14/week and <=4 drinks/occasion for males, <=7/weeks and <= 3 drinks/occasion for females) and that the risk for alcohol disorders and other health effects rises proportionally with the number of drinks per week and how often a drinker exceeds daily limits.  Discussed cessation/primary prevention of drug use and availability of treatment for abuse.   Diet: Encouraged to adjust caloric intake to maintain  or achieve ideal body weight, to reduce intake of dietary saturated fat and total fat, to limit sodium intake by avoiding high sodium foods and not adding table salt, and to maintain adequate dietary potassium and calcium  preferably from fresh fruits, vegetables, and low-fat dairy products.    stressed the importance of regular exercise  Injury prevention: Discussed safety belts, safety helmets, smoke detector, smoking near bedding or upholstery.   Dental health: Discussed importance of regular tooth brushing, flossing, and dental visits.   Follow up plan: NEXT PREVENTATIVE PHYSICAL DUE IN 1 YEAR. Return in about 3 months (around 01/23/2024).

## 2023-10-24 NOTE — Assessment & Plan Note (Signed)
 Stable. Continue to follow with psychiatry. Call with any concerns.

## 2023-10-24 NOTE — Assessment & Plan Note (Signed)
 Recently checked with A1c of 7.1. Continue current regimen. Continue to follow with endocrinology. Call with any concerns.

## 2023-10-25 LAB — CBC WITH DIFFERENTIAL/PLATELET
Basophils Absolute: 0.1 x10E3/uL (ref 0.0–0.2)
Basos: 1 %
EOS (ABSOLUTE): 0.5 x10E3/uL — ABNORMAL HIGH (ref 0.0–0.4)
Eos: 8 %
Hematocrit: 45.4 % (ref 37.5–51.0)
Hemoglobin: 15.5 g/dL (ref 13.0–17.7)
Immature Grans (Abs): 0 x10E3/uL (ref 0.0–0.1)
Immature Granulocytes: 0 %
Lymphocytes Absolute: 1.9 x10E3/uL (ref 0.7–3.1)
Lymphs: 29 %
MCH: 31.5 pg (ref 26.6–33.0)
MCHC: 34.1 g/dL (ref 31.5–35.7)
MCV: 92 fL (ref 79–97)
Monocytes Absolute: 0.6 x10E3/uL (ref 0.1–0.9)
Monocytes: 8 %
Neutrophils Absolute: 3.5 x10E3/uL (ref 1.4–7.0)
Neutrophils: 53 %
Platelets: 211 x10E3/uL (ref 150–450)
RBC: 4.92 x10E6/uL (ref 4.14–5.80)
RDW: 12.9 % (ref 11.6–15.4)
WBC: 6.5 x10E3/uL (ref 3.4–10.8)

## 2023-10-25 LAB — COMPREHENSIVE METABOLIC PANEL WITH GFR
ALT: 33 IU/L (ref 0–44)
AST: 26 IU/L (ref 0–40)
Albumin: 4.6 g/dL (ref 4.1–5.1)
Alkaline Phosphatase: 67 IU/L (ref 47–123)
BUN/Creatinine Ratio: 8 — ABNORMAL LOW (ref 9–20)
BUN: 8 mg/dL (ref 6–24)
Bilirubin Total: 0.4 mg/dL (ref 0.0–1.2)
CO2: 23 mmol/L (ref 20–29)
Calcium: 9.3 mg/dL (ref 8.7–10.2)
Chloride: 103 mmol/L (ref 96–106)
Creatinine, Ser: 0.99 mg/dL (ref 0.76–1.27)
Globulin, Total: 1.7 g/dL (ref 1.5–4.5)
Glucose: 80 mg/dL (ref 70–99)
Potassium: 3.9 mmol/L (ref 3.5–5.2)
Sodium: 141 mmol/L (ref 134–144)
Total Protein: 6.3 g/dL (ref 6.0–8.5)
eGFR: 95 mL/min/1.73 (ref >59)

## 2023-10-25 LAB — PSA: Prostate Specific Ag, Serum: 0.3 ng/mL (ref 0.0–4.0)

## 2023-10-25 LAB — LIPID PANEL W/O CHOL/HDL RATIO
Cholesterol, Total: 143 mg/dL (ref 100–199)
HDL: 61 mg/dL (ref >39)
LDL Chol Calc (NIH): 69 mg/dL (ref 0–99)
Triglycerides: 62 mg/dL (ref 0–149)
VLDL Cholesterol Cal: 13 mg/dL (ref 5–40)

## 2023-10-25 LAB — HEPATITIS B SURFACE ANTIBODY, QUANTITATIVE: Hepatitis B Surf Ab Quant: <3.5 m[IU]/mL — ABNORMAL LOW

## 2023-10-25 LAB — TSH: TSH: 1.88 u[IU]/mL (ref 0.450–4.500)

## 2023-10-26 ENCOUNTER — Encounter: Payer: Self-pay | Admitting: Family Medicine

## 2023-10-28 ENCOUNTER — Ambulatory Visit: Payer: Self-pay | Admitting: Family Medicine

## 2023-11-04 ENCOUNTER — Ambulatory Visit: Admitting: Family Medicine

## 2023-11-06 DIAGNOSIS — F411 Generalized anxiety disorder: Secondary | ICD-10-CM | POA: Diagnosis not present

## 2023-11-06 DIAGNOSIS — F39 Unspecified mood [affective] disorder: Secondary | ICD-10-CM | POA: Diagnosis not present

## 2023-11-28 DIAGNOSIS — F411 Generalized anxiety disorder: Secondary | ICD-10-CM | POA: Diagnosis not present

## 2023-11-28 DIAGNOSIS — F39 Unspecified mood [affective] disorder: Secondary | ICD-10-CM | POA: Diagnosis not present

## 2024-01-10 DIAGNOSIS — E1069 Type 1 diabetes mellitus with other specified complication: Secondary | ICD-10-CM | POA: Diagnosis not present

## 2024-01-10 DIAGNOSIS — I152 Hypertension secondary to endocrine disorders: Secondary | ICD-10-CM | POA: Diagnosis not present

## 2024-01-10 DIAGNOSIS — E109 Type 1 diabetes mellitus without complications: Secondary | ICD-10-CM | POA: Diagnosis not present

## 2024-01-10 DIAGNOSIS — E785 Hyperlipidemia, unspecified: Secondary | ICD-10-CM | POA: Diagnosis not present

## 2024-01-23 DIAGNOSIS — F39 Unspecified mood [affective] disorder: Secondary | ICD-10-CM | POA: Diagnosis not present

## 2024-01-23 DIAGNOSIS — F411 Generalized anxiety disorder: Secondary | ICD-10-CM | POA: Diagnosis not present

## 2024-01-23 DIAGNOSIS — F5105 Insomnia due to other mental disorder: Secondary | ICD-10-CM | POA: Diagnosis not present

## 2024-01-24 ENCOUNTER — Encounter: Payer: Self-pay | Admitting: Family Medicine

## 2024-01-24 ENCOUNTER — Ambulatory Visit: Admitting: Family Medicine

## 2024-01-24 VITALS — BP 128/72 | Wt 194.8 lb

## 2024-01-24 DIAGNOSIS — L299 Pruritus, unspecified: Secondary | ICD-10-CM

## 2024-01-24 DIAGNOSIS — Z720 Tobacco use: Secondary | ICD-10-CM | POA: Diagnosis not present

## 2024-01-24 DIAGNOSIS — E10319 Type 1 diabetes mellitus with unspecified diabetic retinopathy without macular edema: Secondary | ICD-10-CM

## 2024-01-24 DIAGNOSIS — Z23 Encounter for immunization: Secondary | ICD-10-CM

## 2024-01-24 LAB — BAYER DCA HB A1C WAIVED: HB A1C (BAYER DCA - WAIVED): 6.6 % — ABNORMAL HIGH (ref 4.8–5.6)

## 2024-01-24 MED ORDER — GABAPENTIN 100 MG PO CAPS: 100.0000 mg | ORAL_CAPSULE | Freq: Every day | ORAL | 0 refills | Status: AC

## 2024-01-24 MED ORDER — NICOTINE 7 MG/24HR TD PT24: 7.0000 mg | MEDICATED_PATCH | Freq: Every day | TRANSDERMAL | 0 refills | Status: AC

## 2024-01-24 MED ORDER — NICOTINE 14 MG/24HR TD PT24: 14.0000 mg | MEDICATED_PATCH | Freq: Every day | TRANSDERMAL | 0 refills | Status: AC

## 2024-01-24 NOTE — Progress Notes (Signed)
 "  BP 128/72 (Patient Position: Sitting, Cuff Size: Normal)   Wt 194 lb 12.8 oz (88.4 kg)   BMI 28.77 kg/m    Subjective:    Patient ID: Bernard [name], male    DOB: 1976-01-21, 48 y.o.   MRN: [id number]  HPI: Bernard [name] is a 48 y.o. male  Chief Complaint  Patient presents with   Diabetes   DIABETES Hypoglycemic episodes:no Polydipsia/polyuria: no Visual disturbance: no Chest pain: no Paresthesias: no Glucose Monitoring: yes  Accucheck frequency: continuous Taking Insulin ?: yes- pump Blood Pressure Monitoring: not checking Retinal Examination: Not up to Date Foot Exam: Up to Date Diabetic Education: Completed Pneumovax: Up to Date Influenza: Given today Aspirin: no  Has been having phantom itching in his side where he's having his pod. Has been going on for 2 years and getting worse. Driving him nuts.   SMOKING CESSATION Smoking Status: current every day smoker Smoking Amount: less than a pack a day Smoking Quit Date: not set Smoking triggers: stress Type of tobacco use: cigarettes Children in the house: yes Other household members who smoke: no Treatments attempted: chantix  (bad reaction), patches Pneumovax: up to date  Relevant past medical, surgical, family and social history reviewed and updated as indicated. Interim medical history since our last visit reviewed. Allergies and medications reviewed and updated.  Review of Systems  Constitutional: Negative.   Respiratory: Negative.    Cardiovascular: Negative.   Musculoskeletal: Negative.   Neurological: Negative.   Psychiatric/Behavioral: Negative.      Per HPI unless specifically indicated above     Objective:    BP 128/72 (Patient Position: Sitting, Cuff Size: Normal)   Wt 194 lb 12.8 oz (88.4 kg)   BMI 28.77 kg/m   Wt Readings from Last 3 Encounters:  01/24/24 194 lb 12.8 oz (88.4 kg)  10/24/23 199 lb (90.3 kg)  06/06/23 191 lb 12.8 oz (87 kg)    Physical Exam Vitals  and nursing note reviewed.  Constitutional:      General: He is not in acute distress.    Appearance: Normal appearance. He is not ill-appearing, toxic-appearing or diaphoretic.  HENT:     Head: Normocephalic and atraumatic.     Right Ear: External ear normal.     Left Ear: External ear normal.     Nose: Nose normal.     Mouth/Throat:     Mouth: Mucous membranes are moist.     Pharynx: Oropharynx is clear.  Eyes:     General: No scleral icterus.       Right eye: No discharge.        Left eye: No discharge.     Extraocular Movements: Extraocular movements intact.     Conjunctiva/sclera: Conjunctivae normal.     Pupils: Pupils are equal, round, and reactive to light.  Cardiovascular:     Rate and Rhythm: Normal rate and regular rhythm.     Pulses: Normal pulses.     Heart sounds: Normal heart sounds. No murmur heard.    No friction rub. No gallop.  Pulmonary:     Effort: Pulmonary effort is normal. No respiratory distress.     Breath sounds: Normal breath sounds. No stridor. No wheezing, rhonchi or rales.  Chest:     Chest wall: No tenderness.  Musculoskeletal:        General: Normal range of motion.     Cervical back: Normal range of motion and neck supple.  Skin:    General: Skin is warm  and dry.     Capillary Refill: Capillary refill takes less than 2 seconds.     Coloration: Skin is not jaundiced or pale.     Findings: No bruising, erythema, lesion or rash.  Neurological:     General: No focal deficit present.     Mental Status: He is alert and oriented to person, place, and time. Mental status is at baseline.  Psychiatric:        Mood and Affect: Mood normal.        Behavior: Behavior normal.        Thought Content: Thought content normal.        Judgment: Judgment normal.     Results for orders placed or performed in visit on 10/24/23  Hemoglobin A1c   Collection Time: 10/08/23 12:00 AM  Result Value Ref Range   Hemoglobin A1C 7.1   Comprehensive metabolic  panel with GFR   Collection Time: 10/24/23  1:21 PM  Result Value Ref Range   Glucose 80 70 - 99 mg/dL   BUN 8 6 - 24 mg/dL   Creatinine, Ser 9.00 0.76 - 1.27 mg/dL   eGFR 95 >40 fO/fpw/8.26   BUN/Creatinine Ratio 8 (L) 9 - 20   Sodium 141 134 - 144 mmol/L   Potassium 3.9 3.5 - 5.2 mmol/L   Chloride 103 96 - 106 mmol/L   CO2 23 20 - 29 mmol/L   Calcium  9.3 8.7 - 10.2 mg/dL   Total Protein 6.3 6.0 - 8.5 g/dL   Albumin 4.6 4.1 - 5.1 g/dL   Globulin, Total 1.7 1.5 - 4.5 g/dL   Bilirubin Total 0.4 0.0 - 1.2 mg/dL   Alkaline Phosphatase 67 47 - 123 IU/L   AST 26 0 - 40 IU/L   ALT 33 0 - 44 IU/L  CBC with Differential/Platelet   Collection Time: 10/24/23  1:21 PM  Result Value Ref Range   WBC 6.5 3.4 - 10.8 x10E3/uL   RBC 4.92 4.14 - 5.80 x10E6/uL   Hemoglobin 15.5 13.0 - 17.7 g/dL   Hematocrit 54.5 62.4 - 51.0 %   MCV 92 79 - 97 fL   MCH 31.5 26.6 - 33.0 pg   MCHC 34.1 31.5 - 35.7 g/dL   RDW 87.0 88.3 - 84.5 %   Platelets 211 150 - 450 x10E3/uL   Neutrophils 53 Not Estab. %   Lymphs 29 Not Estab. %   Monocytes 8 Not Estab. %   Eos 8 Not Estab. %   Basos 1 Not Estab. %   Neutrophils Absolute 3.5 1.4 - 7.0 x10E3/uL   Lymphocytes Absolute 1.9 0.7 - 3.1 x10E3/uL   Monocytes Absolute 0.6 0.1 - 0.9 x10E3/uL   EOS (ABSOLUTE) 0.5 (H) 0.0 - 0.4 x10E3/uL   Basophils Absolute 0.1 0.0 - 0.2 x10E3/uL   Immature Granulocytes 0 Not Estab. %   Immature Grans (Abs) 0.0 0.0 - 0.1 x10E3/uL  Lipid Panel w/o Chol/HDL Ratio   Collection Time: 10/24/23  1:21 PM  Result Value Ref Range   Cholesterol, Total 143 100 - 199 mg/dL   Triglycerides 62 0 - 149 mg/dL   HDL 61 >60 mg/dL   VLDL Cholesterol Cal 13 5 - 40 mg/dL   LDL Chol Calc (NIH) 69 0 - 99 mg/dL  PSA   Collection Time: 10/24/23  1:21 PM  Result Value Ref Range   Prostate Specific Ag, Serum 0.3 0.0 - 4.0 ng/mL  TSH   Collection Time: 10/24/23  1:21 PM  Result Value  Ref Range   TSH 1.880 0.450 - 4.500 uIU/mL  Microalbumin, Urine  Waived   Collection Time: 10/24/23  1:21 PM  Result Value Ref Range   Microalb, Ur Waived 10 0 - 19 mg/L   Creatinine, Urine Waived 50 10 - 300 mg/dL   Microalb/Creat Ratio <30 <30 mg/g  Hepatitis B surface antibody,quantitative   Collection Time: 10/24/23  1:21 PM  Result Value Ref Range   Hepatitis B Surf Ab Quant <3.5 (L) Immunity>10 mIU/mL      Assessment & Plan:   Problem List Items Addressed This Visit       Endocrine   Diabetic retinopathy associated with type 1 diabetes mellitus (HCC) - Primary   Doing great with A1c of 6.6. Continue current regimen- will share with his endocrinologist. Call with any concerns.       Relevant Orders   Bayer DCA Hb A1c Waived   Other Visit Diagnoses       Itching       ? Nerve irritation. Will treat with gabapentin. Recheck 3 months. Call with any concerns.     Smoking trying to quit       Will start nicoderm. Call with any concerns. Continue to monitor.     Needs flu shot       Flu shot given today.   Relevant Orders   Flu vaccine trivalent PF, 6mos and older(Flulaval,Afluria,Fluarix,Fluzone)        Follow up plan: Return in about 3 months (around 04/23/2024).      "

## 2024-01-24 NOTE — Assessment & Plan Note (Signed)
 Doing great with A1c of 6.6. Continue current regimen- will share with his endocrinologist. Call with any concerns.

## 2024-04-24 ENCOUNTER — Ambulatory Visit: Admitting: Family Medicine
